# Patient Record
Sex: Male | Born: 1970 | Race: Black or African American | Hispanic: No | Marital: Married | Smoking: Never smoker
Health system: Southern US, Community
[De-identification: ages and names within clinical notes are randomized; demographics above are authoritative.]

## PROBLEM LIST (undated history)

## (undated) DIAGNOSIS — Z0000 Encounter for general adult medical examination without abnormal findings: Secondary | ICD-10-CM

## (undated) DIAGNOSIS — D649 Anemia, unspecified: Secondary | ICD-10-CM

## (undated) DIAGNOSIS — Z01818 Encounter for other preprocedural examination: Secondary | ICD-10-CM

## (undated) DIAGNOSIS — N62 Hypertrophy of breast: Secondary | ICD-10-CM

## (undated) DIAGNOSIS — N183 Chronic kidney disease, stage 3 unspecified (HCC): Principal | ICD-10-CM

## (undated) DIAGNOSIS — Z1211 Encounter for screening for malignant neoplasm of colon: Secondary | ICD-10-CM

## (undated) DIAGNOSIS — E559 Vitamin D deficiency, unspecified: Secondary | ICD-10-CM

## (undated) DIAGNOSIS — R5383 Other fatigue: Principal | ICD-10-CM

## (undated) DIAGNOSIS — Z905 Acquired absence of kidney: Secondary | ICD-10-CM

## (undated) DIAGNOSIS — N1831 Chronic kidney disease, stage 3a (HCC): Secondary | ICD-10-CM

## (undated) DIAGNOSIS — R7989 Other specified abnormal findings of blood chemistry: Secondary | ICD-10-CM

## (undated) DIAGNOSIS — E785 Hyperlipidemia, unspecified: Secondary | ICD-10-CM

## (undated) HISTORY — DX: Hyperlipidemia, unspecified: E78.5

---

## 2016-02-16 LAB — HEPATIC FUNCTION PANEL
ALT: 19 U/L (ref 10–40)
ALT: 19 U/L (ref 10–40)
AST: 18 U/L (ref 14–40)
AST: 18 U/L (ref 14–40)
Bilirubin, Total: 0.5 mg/dL

## 2016-02-16 LAB — LIPID PANEL
CHOLESTEROL: 167 mg/dL (ref 0–200)
CHOLESTEROL: 167 mg/dL (ref 0–200)
HDL: 55 mg/dL (ref 35–70)
HDL: 55 mg/dL (ref 35–70)
LDL Cholesterol: 105 mg/dL
LDL Cholesterol: 105 mg/dL
Triglycerides: 34 mg/dL — AB (ref 40–160)
Triglycerides: 34 mg/dL — AB (ref 40–160)

## 2016-02-16 LAB — BASIC METABOLIC PANEL
BUN: 13 mg/dL (ref 4–21)
BUN: 13 mg/dL (ref 4–21)
Creatinine: 1 mg/dL (ref 0.6–1.3)
Creatinine: 1 mg/dL (ref 0.6–1.3)
Glucose: 100 mg/dL
Glucose: 100 mg/dL
POTASSIUM: 4.3 mmol/L (ref 3.4–5.3)
POTASSIUM: 4.3 mmol/L (ref 3.4–5.3)
SODIUM: 138 mmol/L (ref 137–147)
Sodium: 138 mmol/L (ref 137–147)

## 2016-08-20 ENCOUNTER — Encounter: Payer: Self-pay | Admitting: Family Medicine

## 2016-08-20 ENCOUNTER — Ambulatory Visit (INDEPENDENT_AMBULATORY_CARE_PROVIDER_SITE_OTHER): Payer: 59 | Admitting: Family Medicine

## 2016-08-20 VITALS — BP 120/85 | HR 75 | Resp 12 | Ht 69.0 in | Wt 175.5 lb

## 2016-08-20 DIAGNOSIS — E785 Hyperlipidemia, unspecified: Secondary | ICD-10-CM | POA: Insufficient documentation

## 2016-08-20 DIAGNOSIS — I499 Cardiac arrhythmia, unspecified: Secondary | ICD-10-CM | POA: Diagnosis not present

## 2016-08-20 NOTE — Progress Notes (Signed)
HPI:   [name] is a 45 y.o. male, who is here today to follow on HLD.  I have seen Jeremy Howard Howard at [facility], CPE 01/2016.  Currently he is on non-pharmacologic treatment, trying to follow a low-fat diet. He still has not been consistent with a healthy diet. Last FLP in 12/2015 and as he recalls he needed to follow in 6 months.  He lives with his wife and 4 children. He is exercising regularly 2 or 3 times per week.   No concerns today.   Review of Systems  Constitutional: Negative for activity change, appetite change, fatigue, fever and unexpected weight change.  HENT: Negative for nosebleeds, sore throat and trouble swallowing.   Eyes: Negative for pain and visual disturbance.  Respiratory: Negative for apnea, cough, shortness of breath and wheezing.   Cardiovascular: Negative for chest pain, palpitations and leg swelling.  Gastrointestinal: Negative for abdominal pain, nausea and vomiting.  Genitourinary: Negative for decreased urine volume, difficulty urinating and hematuria.  Neurological: Negative for dizziness, seizures, weakness, numbness and headaches.  Psychiatric/Behavioral: Negative for confusion. The patient is not nervous/anxious.       No current outpatient prescriptions on file prior to visit.   No current facility-administered medications on file prior to visit.      Past Medical History:  Diagnosis Date  . Hyperlipidemia    No Known Allergies  Social History   Social History  . Marital status: Married    Spouse name: N/A  . Number of children: N/A  . Years of education: N/A   Social History Main Topics  . Smoking status: Never Smoker  . Smokeless tobacco: Never Used  . Alcohol use Yes  . Drug use: No  . Sexual activity: Not Asked   Other Topics Concern  . None   Social History Narrative  . None    Vitals:   08/20/16 1318  BP: 120/85  Pulse: 75  Resp: 12   Body mass index is 25.92  kg/m.      Physical Exam  Nursing note and vitals reviewed. Constitutional: He is oriented to person, place, and time. He appears well-developed. No distress.  HENT:  Head: Atraumatic.  Mouth/Throat: Oropharynx is clear and moist and mucous membranes are normal.  Eyes: Conjunctivae and EOM are normal. Pupils are equal, round, and reactive to light.  Neck: No thyroid mass and no thyromegaly present.  Cardiovascular: Normal rate and regular rhythm.   Occasional extrasystoles (x 2) are present.  No murmur heard. Pulses:      Dorsalis pedis pulses are 2+ on the right side, and 2+ on the left side.  Respiratory: Effort normal and breath sounds normal. No respiratory distress.  GI: Soft. He exhibits no mass. There is no hepatomegaly. There is no tenderness.  Musculoskeletal: He exhibits edema (trace pitting edema LE bilateral). He exhibits no tenderness.  Lymphadenopathy:    He has no cervical adenopathy.  Neurological: He is alert and oriented to person, place, and time. He has normal strength. Coordination and gait normal.  Skin: Skin is warm. No erythema.  Psychiatric: He has a normal mood and affect.  Well groomed, good eye contact.      ASSESSMENT AND PLAN:     Diagnoses and all orders for this visit:  Irregular heart rate  Mild and asymptomatic. Today EKG machine is not working, plan on doing EKG next OV. Instructed about warning signs. Decrease caffeine intake.  -  Basic Metabolic Panel; Future -     TSH; Future  Hyperlipidemia  No changes in current management, will follow labs done today and will give further recommendations accordingly.  -     Lipid Panel; Future   -He is not fasting today, so will come next week for fasting labs.    -Jeremy Howard. Yousif [name] Sponsel Sr. was advised to return sooner than planned today if new concerns arise.       Betty G. [place], MD  [facility]. [place] office.

## 2016-08-20 NOTE — Progress Notes (Signed)
Pre visit review using our clinic review tool, if applicable. No additional management support is needed unless otherwise documented below in the visit note. 

## 2016-08-20 NOTE — Patient Instructions (Addendum)
A few things to remember from today's visit:   Irregular heart rate - Plan: Basic Metabolic Panel, TSH  Hyperlipidemia - Plan: Lipid Panel   Little or no red meat a low fat diet. Irregular heart rate is very minimal, caution with caffeine. Today we do not have EKG availability, so we can arrange EKG next OV, before if symptoms.    Please be sure medication list is accurate. If a new problem present, please set up appointment sooner than planned today.

## 2016-08-23 ENCOUNTER — Other Ambulatory Visit (INDEPENDENT_AMBULATORY_CARE_PROVIDER_SITE_OTHER): Payer: 59

## 2016-08-23 DIAGNOSIS — E785 Hyperlipidemia, unspecified: Secondary | ICD-10-CM | POA: Diagnosis not present

## 2016-08-23 DIAGNOSIS — I499 Cardiac arrhythmia, unspecified: Secondary | ICD-10-CM | POA: Diagnosis not present

## 2016-08-23 LAB — LIPID PANEL
Cholesterol: 171 mg/dL (ref 0–200)
HDL: 53.9 mg/dL (ref >39.00)
LDL Cholesterol: 109 mg/dL — ABNORMAL HIGH (ref 0–99)
NONHDL: 117.38
Total CHOL/HDL Ratio: 3
Triglycerides: 40 mg/dL (ref 0.0–149.0)
VLDL: 8 mg/dL (ref 0.0–40.0)

## 2016-08-23 LAB — BASIC METABOLIC PANEL
BUN: 15 mg/dL (ref 6–23)
CALCIUM: 8.9 mg/dL (ref 8.4–10.5)
CO2: 28 meq/L (ref 19–32)
CREATININE: 1.04 mg/dL (ref 0.40–1.50)
Chloride: 105 mEq/L (ref 96–112)
GFR: 99.5 mL/min (ref >60.00)
Glucose, Bld: 100 mg/dL — ABNORMAL HIGH (ref 70–99)
Potassium: 3.9 mEq/L (ref 3.5–5.1)
Sodium: 140 mEq/L (ref 135–145)

## 2016-08-23 LAB — TSH: TSH: 1.93 u[IU]/mL (ref 0.35–4.50)

## 2017-02-07 ENCOUNTER — Other Ambulatory Visit (INDEPENDENT_AMBULATORY_CARE_PROVIDER_SITE_OTHER): Payer: 59

## 2017-02-07 DIAGNOSIS — Z0000 Encounter for general adult medical examination without abnormal findings: Secondary | ICD-10-CM

## 2017-02-07 LAB — CBC WITH DIFFERENTIAL/PLATELET
Basophils Absolute: 0 10*3/uL (ref 0.0–0.1)
Basophils Relative: 0.6 % (ref 0.0–3.0)
Eosinophils Absolute: 0 10*3/uL (ref 0.0–0.7)
Eosinophils Relative: 0.9 % (ref 0.0–5.0)
HCT: 41.3 % (ref 39.0–52.0)
Hemoglobin: 13.6 g/dL (ref 13.0–17.0)
Lymphocytes Relative: 37.8 % (ref 12.0–46.0)
Lymphs Abs: 1 10*3/uL (ref 0.7–4.0)
MCHC: 32.9 g/dL (ref 30.0–36.0)
MCV: 83.3 fl (ref 78.0–100.0)
Monocytes Absolute: 0.3 10*3/uL (ref 0.1–1.0)
Monocytes Relative: 12.2 % — ABNORMAL HIGH (ref 3.0–12.0)
Neutro Abs: 1.3 10*3/uL — ABNORMAL LOW (ref 1.4–7.7)
Neutrophils Relative %: 48.5 % (ref 43.0–77.0)
Platelets: 195 10*3/uL (ref 150.0–400.0)
RBC: 4.96 Mil/uL (ref 4.22–5.81)
RDW: 12.9 % (ref 11.5–15.5)
WBC: 2.7 10*3/uL — ABNORMAL LOW (ref 4.0–10.5)

## 2017-02-07 LAB — POC URINALSYSI DIPSTICK (AUTOMATED)
Bilirubin, UA: NEGATIVE
Glucose, UA: NEGATIVE
Ketones, UA: NEGATIVE
Leukocytes, UA: NEGATIVE
NITRITE UA: NEGATIVE
PH UA: 5.5
RBC UA: NEGATIVE
Spec Grav, UA: >=1.03
UROBILINOGEN UA: 0.2

## 2017-02-07 LAB — BASIC METABOLIC PANEL
BUN: 13 mg/dL (ref 6–23)
CALCIUM: 9.3 mg/dL (ref 8.4–10.5)
CO2: 29 meq/L (ref 19–32)
CREATININE: 1.02 mg/dL (ref 0.40–1.50)
Chloride: 104 mEq/L (ref 96–112)
GFR: 101.54 mL/min (ref >60.00)
Glucose, Bld: 104 mg/dL — ABNORMAL HIGH (ref 70–99)
Potassium: 3.9 mEq/L (ref 3.5–5.1)
SODIUM: 140 meq/L (ref 135–145)

## 2017-02-07 LAB — LIPID PANEL
CHOL/HDL RATIO: 3
CHOLESTEROL: 184 mg/dL (ref 0–200)
HDL: 58.2 mg/dL (ref >39.00)
LDL Cholesterol: 118 mg/dL — ABNORMAL HIGH (ref 0–99)
NonHDL: 125.78
TRIGLYCERIDES: 41 mg/dL (ref 0.0–149.0)
VLDL: 8.2 mg/dL (ref 0.0–40.0)

## 2017-02-07 LAB — HEPATIC FUNCTION PANEL
ALBUMIN: 4.1 g/dL (ref 3.5–5.2)
ALK PHOS: 52 U/L (ref 39–117)
ALT: 19 U/L (ref 0–53)
AST: 19 U/L (ref 0–37)
BILIRUBIN DIRECT: 0.1 mg/dL (ref 0.0–0.3)
TOTAL PROTEIN: 7.1 g/dL (ref 6.0–8.3)
Total Bilirubin: 0.5 mg/dL (ref 0.2–1.2)

## 2017-02-08 LAB — TSH: TSH: 1.61 u[IU]/mL (ref 0.35–4.50)

## 2017-02-08 LAB — PSA: PSA: 0.37 ng/mL (ref 0.10–4.00)

## 2017-02-16 NOTE — Progress Notes (Signed)
HPI:  Mr. Jeremy Howard Sr. is a 46 y.o.male here today for his routine physical examination.  He lives with wife and their 4 children.  Regular exercise 3 or more times per week: 2 times per week. Following a healthy diet: Not consistently.    Chronic medical problems: HLD.  Hx of STD's: Per pt report Hx of positive hepatitis C ab about 10 years ago,as he recalls  he had 2 more test that were negative.He is not sure about indication for screening. He denies risk factors.    Immunization History  Administered Date(s) Administered  . Tdap 02/16/2016    -Denies high alcohol intake, tobacco use, or Hx of illicit drug use.  He had labs done recently:  Lab Results  Component Value Date   CHOL 184 02/07/2017   HDL 58.20 02/07/2017   LDLCALC 118 (H) 02/07/2017   TRIG 41.0 02/07/2017   CHOLHDL 3 02/07/2017   Lab Results  Component Value Date   TSH 1.61 02/07/2017   Lab Results  Component Value Date   WBC 2.7 (L) 02/07/2017   HGB 13.6 02/07/2017   HCT 41.3 02/07/2017   MCV 83.3 02/07/2017   PLT 195.0 02/07/2017   Lab Results  Component Value Date   ALT 19 02/07/2017   AST 19 02/07/2017   ALKPHOS 52 02/07/2017   BILITOT 0.5 02/07/2017    -Concerns and/or follow up today: Laud snoring, wife has not noted apnea. Denies fatigue or morning headaches.  WBC's mildly low. He reports Hx of low WBC's.  Colonoscopy done at 46 yo. According to pt,his father had a abnormal colonoscopy, ? Cancer.Mother Hx of colon cancer in her 34's. He is not sure about follow up recommendations.  Last OV irregular HR noted,we could not do an EKG because machine was not working. Denies chest pain, dyspnea, palpitation,or edema.   Review of Systems  Constitutional: Negative for activity change, appetite change, fatigue, fever and unexpected weight change.  HENT: Negative for dental problem, nosebleeds, sore throat, trouble swallowing and voice change.   Eyes: Negative for  redness and visual disturbance.  Respiratory: Negative for apnea, cough, shortness of breath and wheezing.   Cardiovascular: Negative for chest pain, palpitations and leg swelling.  Gastrointestinal: Negative for abdominal pain, blood in stool, nausea and vomiting.  Endocrine: Negative for polydipsia and polyuria.  Genitourinary: Negative for decreased urine volume, dysuria, genital sores, hematuria and testicular pain.  Musculoskeletal: Negative for arthralgias, back pain, joint swelling and myalgias.  Skin: Negative for color change and rash.  Neurological: Negative for dizziness, seizures, syncope, weakness, numbness and headaches.  Hematological: Negative for adenopathy. Does not bruise/bleed easily.  Psychiatric/Behavioral: Negative for confusion and sleep disturbance. The patient is not nervous/anxious.      No current outpatient prescriptions on file prior to visit.   No current facility-administered medications on file prior to visit.      Past Medical History:  Diagnosis Date  . Hyperlipidemia     No Known Allergies  Family History  Problem Relation Age of Onset  . Cancer Mother 78    Colon  . Cancer Father     ? colon  . Hypertension Father   . Diabetes Father     Social History   Social History  . Marital status: Married    Spouse name: N/A  . Number of children: N/A  . Years of education: N/A   Social History Main Topics  . Smoking status: Never Smoker  .  Smokeless tobacco: Never Used  . Alcohol use Yes  . Drug use: No  . Sexual activity: Yes    Birth control/ protection: Surgical   Other Topics Concern  . None   Social History Narrative  . None    Vitals:   02/17/17 0800  BP: 138/90  Pulse: 85  Resp: 12   Body mass index is 26.03 kg/m. O2 sat at RA 98%  Wt Readings from Last 3 Encounters:  02/17/17 176 lb 4 oz (79.9 kg)  08/20/16 175 lb 8 oz (79.6 kg)     Physical Exam  Nursing note and vitals reviewed. Constitutional: He is  oriented to person, place, and time. He appears well-developed and well-nourished. No distress.  HENT:  Head: Atraumatic.  Right Ear: Hearing, tympanic membrane, external ear and ear canal normal.  Left Ear: Hearing, tympanic membrane, external ear and ear canal normal.  Mouth/Throat: Oropharynx is clear and moist and mucous membranes are normal.  Eyes: Conjunctivae and EOM are normal. Pupils are equal, round, and reactive to light.  Neck: Normal range of motion. No JVD present. No tracheal deviation present. No thyroid mass and no thyromegaly present.  Cardiovascular: Normal rate and regular rhythm.   No murmur heard. Pulses:      Dorsalis pedis pulses are 2+ on the right side, and 2+ on the left side.  Respiratory: Effort normal and breath sounds normal. No respiratory distress.  GI: Soft. He exhibits no mass. There is no hepatomegaly. There is no tenderness.  Musculoskeletal: He exhibits no edema or tenderness.  No major deformities appreciated and no signs of synovitis.  Lymphadenopathy:    He has no cervical adenopathy.       Right: No supraclavicular adenopathy present.       Left: No supraclavicular adenopathy present.  Neurological: He is alert and oriented to person, place, and time. He has normal strength. No cranial nerve deficit or sensory deficit. Coordination and gait normal.  Reflex Scores:      Bicep reflexes are 2+ on the right side and 2+ on the left side.      Patellar reflexes are 2+ on the right side and 2+ on the left side. Skin: Skin is warm. No erythema.  Psychiatric: He has a normal mood and affect.     ASSESSMENT AND PLAN:   Discussed the following assessment and plan:   Jeremy Howard was seen today for annual exam.  Diagnoses and all orders for this visit:  Routine physical examination   We discussed the importance of regular physical activity and healthy diet for prevention of chronic illness. Preventive guidelines reviewed. Vaccination up to  date. Next CPE in 1-2 years.   Encounter for HCV screening test for low risk patient -     Hepatitis C antibody screen  Irregular heart rate  Asymptomatic. EKG today: SR,normal axis and intervals, PAC's. No other EKG for comparison. Instructed about warning signs.   -     EKG 12-Lead  Family hx of colon cancer -     Ambulatory referral to Gastroenterology   He will try to obtain copy of prior labs done while he was living in [place], we will following with labs if necessary.   Return in 1 year (on 02/17/2018).    Jeremy Breden G. [place], MD  [facility]. [place] office.

## 2017-02-17 ENCOUNTER — Encounter: Payer: Self-pay | Admitting: Family Medicine

## 2017-02-17 ENCOUNTER — Ambulatory Visit (INDEPENDENT_AMBULATORY_CARE_PROVIDER_SITE_OTHER): Payer: 59 | Admitting: Family Medicine

## 2017-02-17 VITALS — BP 138/90 | HR 85 | Resp 12 | Ht 69.0 in | Wt 176.2 lb

## 2017-02-17 DIAGNOSIS — Z800 Family history of malignant neoplasm of digestive organs: Secondary | ICD-10-CM | POA: Diagnosis not present

## 2017-02-17 DIAGNOSIS — Z0000 Encounter for general adult medical examination without abnormal findings: Secondary | ICD-10-CM

## 2017-02-17 DIAGNOSIS — I499 Cardiac arrhythmia, unspecified: Secondary | ICD-10-CM

## 2017-02-17 DIAGNOSIS — Z1159 Encounter for screening for other viral diseases: Secondary | ICD-10-CM

## 2017-02-17 NOTE — Progress Notes (Signed)
Pre visit review using our clinic review tool, if applicable. No additional management support is needed unless otherwise documented below in the visit note. 

## 2017-02-17 NOTE — Patient Instructions (Addendum)
A few things to remember from today's visit:   Routine physical examination  Encounter for screening for HIV - Plan: Ambulatory referral to Gastroenterology  Encounter for HCV screening test for low risk patient - Plan: Hepatitis C antibody screen  Irregular heart rate - Plan: EKG 12-Lead    At least 150 minutes of moderate exercise per week, daily brisk walking for 15-30 min is a good exercise option. Healthy diet low in saturated (animal) fats and sweets and consisting of fresh fruits and vegetables, lean meats such as fish and white chicken and whole grains.  - Vaccines:  Tdap vaccine every 10 years.  Shingles vaccine recommended at age 45, could be given after 45 years of age but not sure about insurance coverage.  Pneumonia vaccines:  Prevnar 13 at 65 and Pneumovax at 2566.   -Screening recommendations for low/normal risk males:  Screening for diabetes at age 45-45 and every 3 years.    Lipid screening at 35 and every 3 years.   Colonoscopy for colon cancer screening at age 45 and until age 45.In your case because risk factors we started earlier.  Prostate cancer screening: some controversy.  For snoring: Nasal spray like Rhinocort, nasal strips,and sleeping on your side instead on the back.         Please be sure medication list is accurate. If a new problem present, please set up appointment sooner than planned today.

## 2017-02-18 LAB — HEPATITIS C ANTIBODY: HCV Ab: NEGATIVE

## 2017-02-21 ENCOUNTER — Ambulatory Visit (INDEPENDENT_AMBULATORY_CARE_PROVIDER_SITE_OTHER): Payer: 59 | Admitting: Family Medicine

## 2017-02-21 ENCOUNTER — Encounter: Payer: Self-pay | Admitting: Family Medicine

## 2017-02-21 VITALS — BP 118/80 | HR 83 | Temp 100.3°F | Resp 12 | Ht 69.0 in | Wt 175.2 lb

## 2017-02-21 DIAGNOSIS — R509 Fever, unspecified: Secondary | ICD-10-CM | POA: Diagnosis not present

## 2017-02-21 DIAGNOSIS — J069 Acute upper respiratory infection, unspecified: Secondary | ICD-10-CM | POA: Diagnosis not present

## 2017-02-21 LAB — POC INFLUENZA A&B (BINAX/QUICKVUE)
INFLUENZA A, POC: NEGATIVE
INFLUENZA B, POC: NEGATIVE

## 2017-02-21 MED ORDER — KETOROLAC TROMETHAMINE 60 MG/2ML IM SOLN
60.0000 mg | Freq: Once | INTRAMUSCULAR | Status: AC
  Administered 2017-02-21: 60 mg via INTRAMUSCULAR

## 2017-02-21 MED ORDER — BENZONATATE 100 MG PO CAPS: 200.0000 mg | ORAL_CAPSULE | Freq: Two times a day (BID) | ORAL | 0 refills | Status: AC | PRN

## 2017-02-21 NOTE — Progress Notes (Addendum)
HPI:  ACUTE VISIT  Chief Complaint  Patient presents with  . congestion, cough    [name] is a 46 y.o.male here today complaining of 2-3 days of respiratory symptoms.   URI   This is a new problem. The current episode started in the past 7 days. The problem has been gradually worsening. The maximum temperature recorded prior to his arrival was 100.4 - 100.9 F. Associated symptoms include congestion, coughing, headaches (worse with cough) and rhinorrhea. Pertinent negatives include no abdominal pain, chest pain, diarrhea, ear pain, nausea, neck pain, rash, sore throat or vomiting.    Mild productive cough with yellowish sputum. + Nasal congestion, rhinorrhea, and post nasal drainage. + Fever and body aches. He has not noted chest pain or dyspnea, not sure about wheezing.  No Hx of recent travel. Sick contact: Coworker. No known insect bite.  Hx of allergies: No  OTC medications for this problem: Mucinex today,Ibuprofen 2 days ago.He has not taken antipyretic/analgesic today.   Symptoms otherwise stable.   Review of Systems  Constitutional: Positive for appetite change, fatigue and fever. Negative for chills.  HENT: Positive for congestion, postnasal drip and rhinorrhea. Negative for ear pain, mouth sores, sinus pressure, sore throat, trouble swallowing and voice change.   Eyes: Negative for discharge, redness and visual disturbance.  Respiratory: Positive for cough. Negative for chest tightness and shortness of breath.   Cardiovascular: Negative for chest pain and leg swelling.  Gastrointestinal: Negative for abdominal pain, diarrhea, nausea and vomiting.  Musculoskeletal: Positive for myalgias. Negative for arthralgias and neck pain.  Skin: Negative for rash.  Neurological: Positive for headaches (worse with cough). Negative for syncope and weakness.  Hematological: Negative for adenopathy. Does not bruise/bleed easily.    Psychiatric/Behavioral: Negative for confusion.      No current outpatient prescriptions on file prior to visit.   No current facility-administered medications on file prior to visit.      Past Medical History:  Diagnosis Date  . Hyperlipidemia    No Known Allergies  Social History   Social History  . Marital status: Married    Spouse name: N/A  . Number of children: N/A  . Years of education: N/A   Social History Main Topics  . Smoking status: Never Smoker  . Smokeless tobacco: Never Used  . Alcohol use Yes  . Drug use: No  . Sexual activity: Yes    Birth control/ protection: Surgical   Other Topics Concern  . None   Social History Narrative  . None    Vitals:   02/21/17 1006  BP: 118/80  Pulse: 83  Resp: 12  Temp: 100.3 F (37.9 C)  O2 sat 97% at RA. Body mass index is 25.88 kg/m.   Physical Exam  Nursing note and vitals reviewed. Constitutional: He is oriented to person, place, and time. He appears well-developed and well-nourished. He does not appear ill. No distress.  HENT:  Head: Atraumatic.  Right Ear: Tympanic membrane, external ear and ear canal normal.  Left Ear: Tympanic membrane, external ear and ear canal normal.  Nose: Rhinorrhea present. Right sinus exhibits no maxillary sinus tenderness and no frontal sinus tenderness. Left sinus exhibits no maxillary sinus tenderness and no frontal sinus tenderness.  Mouth/Throat: Oropharynx is clear and moist and mucous membranes are normal.  Eyes: Conjunctivae and EOM are normal.  Cardiovascular: Normal rate and regular rhythm.   Occasional extrasystoles are present.  No murmur heard. Respiratory: Effort normal  and breath sounds normal. No stridor. No respiratory distress.  Lymphadenopathy:       Head (right side): No submandibular adenopathy present.       Head (left side): No submandibular adenopathy present.    He has no cervical adenopathy.  Neurological: He is alert and oriented to person,  place, and time. He has normal strength.  Skin: Skin is warm. No rash noted. No erythema.  Psychiatric: He has a normal mood and affect. His speech is normal.  Well groomed, good eye contact.      ASSESSMENT AND PLAN:   Larkin was seen today for congestion, cough.  Diagnoses and all orders for this visit:  Fever, unspecified fever cause -     POC Influenza A&B (Binax test) -     ketorolac (TORADOL) injection 60 mg; Inject 2 mLs (60 mg total) into the muscle once.  URI, acute -     benzonatate (TESSALON) 100 MG capsule; Take 2 capsules (200 mg total) by mouth 2 (two) times daily as needed for cough.   Symptoms suggests a viral etiology, I explained patient that symptomatic treatment is recommended at this time.  After verbal consider he received Toradol 60 mg IM x 1 for fever and myalgias. Rapid flu negative. We also discussed the possibility of influenza and risk vs benefits of empiric treatment with Tamiflu. We agree on not doing so.  Instructed to monitor for signs of complications, clearly instructed about warning signs. I also explained that cough and nasal congestion can last a few days and sometimes weeks. F/U as needed.     -[name] was advised to return or notify a doctor immediately if symptoms worsen or persist or new concerns arise.       Everlean Bucher G. [place], MD  [facility]. [place] office.

## 2017-02-21 NOTE — Patient Instructions (Signed)
A few things to remember from today's visit:   Fever, unspecified fever cause - Plan: POC Influenza A&B (Binax test)  URI, acute  viral infections are self-limited and we treat each symptom depending of severity.  Over the counter medications as decongestants and cold medications usually help, they need to be taken with caution if there is a history of high blood pressure or palpitations. Tylenol and/or Ibuprofen also helps with most symptoms (headache, muscle aching, fever,etc) Plenty of fluids. Honey helps with cough. Steam inhalations helps with runny nose, nasal congestion, and may prevent sinus infections. Cough and nasal congestion could last a few days and sometimes weeks. Please follow in not any better in 1-2 weeks or if symptoms get worse.   Please be sure medication list is accurate. If a new problem present, please set up appointment sooner than planned today.

## 2017-02-21 NOTE — Progress Notes (Signed)
Pre visit review using our clinic review tool, if applicable. No additional management support is needed unless otherwise documented below in the visit note. 

## 2017-04-20 ENCOUNTER — Encounter: Payer: Self-pay | Admitting: Family Medicine

## 2017-04-27 ENCOUNTER — Ambulatory Visit (INDEPENDENT_AMBULATORY_CARE_PROVIDER_SITE_OTHER): Payer: 59 | Admitting: Family Medicine

## 2017-04-27 ENCOUNTER — Encounter: Payer: Self-pay | Admitting: Family Medicine

## 2017-04-27 ENCOUNTER — Ambulatory Visit (INDEPENDENT_AMBULATORY_CARE_PROVIDER_SITE_OTHER)
Admission: RE | Admit: 2017-04-27 | Discharge: 2017-04-27 | Disposition: A | Discharge status: Home / Self Care | Payer: 59 | Source: Ambulatory Visit | Attending: Family Medicine | Admitting: Family Medicine

## 2017-04-27 VITALS — BP 118/78 | HR 76 | Temp 98.8°F | Resp 12 | Ht 69.0 in | Wt 171.5 lb

## 2017-04-27 DIAGNOSIS — R0781 Pleurodynia: Secondary | ICD-10-CM | POA: Diagnosis not present

## 2017-04-27 DIAGNOSIS — R05 Cough: Secondary | ICD-10-CM

## 2017-04-27 DIAGNOSIS — R053 Chronic cough: Secondary | ICD-10-CM

## 2017-04-27 DIAGNOSIS — M7542 Impingement syndrome of left shoulder: Secondary | ICD-10-CM

## 2017-04-27 NOTE — Patient Instructions (Signed)
A few things to remember from today's visit:   Persistent cough - Plan: DG Chest 2 View  Costal margin pain  Impingement syndrome of left shoulder - Plan: Ambulatory referral to Physical Therapy  Cough: ? GERD,allergies among some.  Today X ray was ordered.  [facility] between 8 am and 5 pm: [address] [phone].   Please be sure medication list is accurate. If a new problem present, please set up appointment sooner than planned today.

## 2017-04-27 NOTE — Progress Notes (Signed)
HPI:   ACUTE VISIT:  Chief Complaint  Patient presents with  . Cough  . chest congestion    Jeremy [name] Malatesta Sr. is a 46 y.o. male, who is here today complaining of cough and shoulder pain.  I saw him on 02/21/17 for acute URI, he states that all symptoms resolved,including cough. Cough started back about a month ago, "lingering cough." It is occasional, he has not identified exacerbating factors, states that he does not have cough when he is "moving around." Denies Hx of allergies or GERD like symptoms.  Also concerned about "something I feel in my lungs", 2 weeks of right-sided chest pain (rigth rib cage) with deep breathing and when lying on right side in bed. No hx of trauma, no deformity, local edema ,or erythema. Initially he was feeling like a "clicking" sensation in RUQ with deep breathing that lasted a week. Pain is achy like,2/10,no radiated. He has not tried OTC treatment. It has improved.  Cough  This is a new problem. The current episode started 1 to 4 weeks ago. The problem has been unchanged. The cough is non-productive. Pertinent negatives include no chills, fever, headaches, heartburn, hemoptysis, nasal congestion, postnasal drip, rash, rhinorrhea, sore throat, shortness of breath, sweats, weight loss or wheezing. Nothing aggravates the symptoms. He has tried nothing for the symptoms. There is no history of environmental allergies.  Shoulder Pain   The pain is present in the left shoulder. This is a new problem. The current episode started 1 to 4 weeks ago. There has been no history of extremity trauma. The problem occurs intermittently. The problem has been gradually improving. The quality of the pain is described as sharp. The pain is at a severity of 4/10. The pain is moderate. Associated symptoms include a limited range of motion. Pertinent negatives include no fever, itching, joint locking, joint swelling, numbness or stiffness. The symptoms are aggravated  by activity. He has tried rest for the symptoms. The treatment provided moderate relief. There is no history of diabetes or osteoarthritis.   About 2-3 weeks ago he was squatted holding himself with hyperextended left hand on floor while he was doing rotation ROM with right UE, he felt "little pain" in left shoulder. After this he has felt like his shoulders "pops out and back in place" with certain movements. He denies limitation of ROM.  He has not tried OTC analgesics or treatments. He has tried to avoid activities that involve above head upper extremity movement. Pain is stable.  Review of Systems  Constitutional: Negative for appetite change, chills, fatigue, fever, unexpected weight change and weight loss.  HENT: Negative for congestion, mouth sores, postnasal drip, rhinorrhea, sore throat and trouble swallowing.   Respiratory: Positive for cough. Negative for hemoptysis, chest tightness, shortness of breath and wheezing.   Cardiovascular: Negative for palpitations and leg swelling.  Gastrointestinal: Negative for abdominal pain, heartburn, nausea and vomiting.  Genitourinary: Negative for decreased urine volume, dysuria, flank pain and hematuria.  Musculoskeletal: Positive for arthralgias. Negative for back pain, joint swelling, neck pain and stiffness.  Skin: Negative for itching, pallor and rash.  Allergic/Immunologic: Negative for environmental allergies.  Neurological: Negative for weakness, numbness and headaches.  Hematological: Negative for adenopathy. Does not bruise/bleed easily.  Psychiatric/Behavioral: Positive for sleep disturbance. Negative for confusion. The patient is nervous/anxious.      No current outpatient prescriptions on file prior to visit.   No current facility-administered medications on file prior to visit.  Past Medical History:  Diagnosis Date  . Hyperlipidemia    No Known Allergies  Social History   Social History  . Marital status: Married     Spouse name: N/A  . Number of children: N/A  . Years of education: N/A   Social History Main Topics  . Smoking status: Never Smoker  . Smokeless tobacco: Never Used  . Alcohol use Yes  . Drug use: No  . Sexual activity: Yes    Birth control/ protection: Surgical   Other Topics Concern  . None   Social History Narrative  . None    Vitals:   04/27/17 0923  BP: 118/78  Pulse: 76  Resp: 12  Temp: 98.8 F (37.1 C)  O2 sat at RA 97% Body mass index is 25.33 kg/m.   Physical Exam  Nursing note and vitals reviewed. Constitutional: He is oriented to person, place, and time. He appears well-developed and well-nourished. No distress.  HENT:  Head: Atraumatic.  Mouth/Throat: Oropharynx is clear and moist and mucous membranes are normal.  Post nasal drainage.  Eyes: Conjunctivae and EOM are normal.  Cardiovascular: Normal rate and regular rhythm.   No murmur heard. Respiratory: Effort normal and breath sounds normal. No respiratory distress. He exhibits no tenderness.  GI: Soft. He exhibits no mass. There is no hepatomegaly. There is no tenderness.  Musculoskeletal: He exhibits no edema.  Left shoulder: No deformity, edema, or erythema appreciated.No muscle atrophy. Juanetta GoslingHawkins' test pos, drop arm rotator cuff test pos, empty can supraspinatus test pos, lift-Off Subscapularis test limitation of internal rotation, pain elicited. ROM limited.  No tenderness upon palpation of right rib cage,no deformity.Pain elicited with deep breathing during auscultation.  Lymphadenopathy:    He has no cervical adenopathy.  Neurological: He is alert and oriented to person, place, and time. He has normal strength. Coordination and gait normal.  Skin: Skin is warm. No rash noted. No erythema.  Psychiatric: He has a normal mood and affect. Cognition and memory are normal.  Well groomed, good eye contact.    ASSESSMENT AND PLAN:   Jeremy Howard was seen today for cough and chest  congestion.  Diagnoses and all orders for this visit:  Persistent cough  ? Allergies,post nasal drip, GERD among some. Lung auscultation negative, cough doe snot seem to be interfering with activities or sleep. Further recommendations will be given according to imaging results. Instructed about warning signs.  -     DG Chest 2 View; Future  Costal margin pain  We discussed a few possible etiologies. It seems musculoskeletal, examination today otherwise normal. Avoid shallow breathing. Instructed about warning signs. F/U as needed.   Impingement syndrome of left shoulder  Treatment options discussed. PT recommended. Since there is not a Hx of blunt trauma I do not think imaging is needed at this time. If not improved with PT MRI and/or ortho referral will be necessary.  -     Ambulatory referral to Physical Therapy   -Jeremy [name] Falzon Sr. advised to seek immediate medical attention is symptoms suddenly get worse or to follow if symptoms persist or new concerns arise.       Betty G. [place]Jordan, MD  [facility]. [place] [facility].

## 2017-08-18 ENCOUNTER — Encounter: Payer: Self-pay | Admitting: Family Medicine

## 2017-08-30 ENCOUNTER — Ambulatory Visit: Payer: 59 | Admitting: Physical Therapy

## 2017-08-30 ENCOUNTER — Ambulatory Visit: Payer: 59 | Attending: Family Medicine | Admitting: Physical Therapy

## 2017-08-30 ENCOUNTER — Encounter: Payer: Self-pay | Admitting: Physical Therapy

## 2017-08-30 DIAGNOSIS — M6281 Muscle weakness (generalized): Secondary | ICD-10-CM | POA: Diagnosis present

## 2017-08-30 DIAGNOSIS — M25512 Pain in left shoulder: Secondary | ICD-10-CM | POA: Diagnosis present

## 2017-08-30 DIAGNOSIS — M25612 Stiffness of left shoulder, not elsewhere classified: Secondary | ICD-10-CM | POA: Diagnosis present

## 2017-08-30 NOTE — Patient Instructions (Signed)
   ELASTIC BAND SHOULDER EXTERNAL ROTATION - ER  While holding an elastic band at your side with your elbow bent, start with your hand near your stomach and then pull the band away. Keep your elbow at your side the entire time. 20x    ELASTIC BAND SHOULDER INTERNAL ROTATION - IR  While holding an elastic band at your side with your elbow bent, start with your hand away from your stomach, then pull the band towards your stomach. Keep your elbow near your side the entire time. 20x    Rows with Theraband  Standing with feet hip width apart, stomach drawn in and glute muscles tight. Start with arms extended in front of you. Pull shoulder blades down and back and then pull elbows in towards body. Slowly return to starting position.  20x    ELASTIC BAND SCAPULAR RETRACTIONS WITH MINI SHOULDER EXTENSIONS  While holding an elastic band with both arms in front of you with your elbows straight, squeeze your shoulder blades together as you pull the band back. Be sure your shoulders do not raise up.  20x

## 2017-08-30 NOTE — Therapy (Signed)
[facility] [address] [place], [place], [address] Phone: [phone]   Fax:  [phone]  Physical Therapy Evaluation  Patient Details  Name: Jeremy Deruiter [facility] Sr. MRN: [id number] Date of Birth: 1971-03-27 Referring Provider: [place], Betty G, MD  Encounter Date: 08/30/2017      PT End of Session - 08/30/17 0838    Visit Number 1   Date for PT Re-Evaluation 10/25/17   PT Start Time 0802   PT Stop Time 0838   PT Time Calculation (min) 36 min   Activity Tolerance Patient tolerated treatment well   Behavior During Therapy [facility] for tasks assessed/performed      Past Medical History:  Diagnosis Date  . Hyperlipidemia     History reviewed. No pertinent surgical history.  There were no vitals filed for this visit.       Subjective Assessment - 08/30/17 0806    Subjective Doesn't have pain frequently.  Mostly when reaching arm out to pick up something and I don't have full motion.  I fell on the shoulder and then felt a little strain in it when lifting weights.  I feel like straining to reach overhead   Limitations House hold activities   Patient Stated Goals strength and full ROM   Currently in Pain? Yes   Pain Score 6   only 6/10 when reaching with resistance   Pain Location Shoulder   Pain Orientation Left  AC joint   Pain Descriptors / Indicators Dull   Pain Type Acute pain   Pain Onset More than a month ago   Pain Frequency Intermittent   Aggravating Factors  reaching out   Pain Relieving Factors out of the position   Effect of Pain on Daily Activities use it less   Multiple Pain Sites No            [facility] PT Assessment - 08/30/17 0001      Assessment   Medical Diagnosis M75.42 (ICD-10-CM) - Impingement syndrome of left shoulder   Referring Provider [place], Betty G, MD   Onset Date/Surgical Date --  fell on shoulder a few months ago   Hand Dominance Right   Prior Therapy No     Precautions   Precautions None     Restrictions   Weight Bearing Restrictions No     Balance Screen   Has the patient fallen in the past 6 months Yes   How many times? 1   Has the patient had a decrease in activity level because of a fear of falling?  No   Is the patient reluctant to leave their home because of a fear of falling?  No     Home [employer]ist information centre manager residence   Living Arrangements Spouse/significant other;Children     Prior Function   Level of Independence Independent   Vocation Full time employment   Medical illustrator, sitting/computer     Cognition   Overall Cognitive Status Within Functional Limits for tasks assessed     Observation/Other Assessments   Focus on Therapeutic Outcomes (FOTO)  41% limited     Posture/Postural Control   Posture/Postural Control Postural limitations   Postural Limitations Rounded Shoulders     ROM / Strength   AROM / PROM / Strength AROM;Strength     AROM   AROM Assessment Site Shoulder   Right Shoulder Flexion 144 Degrees   Right Shoulder ABduction 158 Degrees   Left Shoulder Flexion 135 Degrees  Left Shoulder ABduction 140 Degrees     Strength   Strength Assessment Site Shoulder   Right Shoulder Flexion 5/5   Right Shoulder External Rotation 5/5   Left Shoulder Flexion 4+/5   Left Shoulder External Rotation 4+/5     Palpation   Palpation comment anterior delt and pecs tight and tender     Special Tests    Special Tests Rotator Cuff Impingement   Rotator Cuff Impingment tests Neer impingement test;Hawkins- Kennedy test     Neer Impingement test    Findings Positive   Side Left     Hawkins-Kennedy test   Findings Positive   Side Left     Ambulation/Gait   Gait Pattern Within Functional Limits            Objective measurements completed on examination: See above findings.          [facility] Adult PT Treatment/Exercise - 08/30/17 0001      Exercises   Exercises  Other Exercises   Other Exercises  HEP as seen in chart -educated and performed     Manual Therapy   Manual Therapy Soft tissue mobilization   Soft tissue mobilization anterior delt and pec - left side only                PT Education - 08/30/17 0837    Education provided Yes   Education Details shoulder band exercise 4 ways, pec stretch   Person(s) Educated Patient   Methods Explanation;Demonstration;Verbal cues;Handout   Comprehension Verbalized understanding;Returned demonstration          PT Short Term Goals - 08/30/17 0840      PT SHORT TERM GOAL #1   Title pt will be independent with initial HEP   Time 4   Period Weeks   Status New   Target Date 09/27/17     PT SHORT TERM GOAL #2   Title pt will report 25% reduction in stiffness   Time 4   Period Weeks   Status New   Target Date 09/27/17     PT SHORT TERM GOAL #3   Title Pt will report 25% less pain and popping in shoulder   Time 4   Period Weeks   Status New   Target Date 09/27/17           PT Long Term Goals - 08/30/17 0842      PT LONG TERM GOAL #1   Title pt will be independent in advanced HEP   Time 8   Period Weeks   Status New   Target Date 10/25/17     PT LONG TERM GOAL #2   Title pt will be able to perform tasks like washing car with outreached arm without pain   Time 8   Period Weeks   Status New   Target Date 10/25/17     PT LONG TERM GOAL #3   Title pt will be < or = to 26% limited shown by FOTO score   Time 8   Period Weeks   Status New   Target Date 10/25/17     PT LONG TERM GOAL #4   Title Pt will have full flexion and abduction ROM equal to Rt side   Time 8   Period Weeks   Status New   Target Date 10/25/17     PT LONG TERM GOAL #5   Title Pt will have 5/5 MMT Lt shoulder flexion without pain or popping   Time  8   Period Weeks   Status New   Target Date 10/25/17                Plan - 08/30/17 1316    Clinical Impression Statement Pt presents  to clinic due to left shoulder pain when reaching out and putting weight through the arm.  Pain is limiting his exercise due to pain.  Pt demonstrates decreased left shoulder ROM and weakness with some popping during resisted flexion.  Pt has muscle spasms in left anterior deltoid and pec.  Painful RTC muscle attachments present . Pt will benefit from skilled PT to address imprairments and return to prior level of activities.   History and Personal Factors relevant to plan of care: n/a   Clinical Presentation Stable   Clinical Decision Making Low   Rehab Potential Excellent   PT Frequency 1x / week   PT Duration 8 weeks   PT Treatment/Interventions ADLs/Self Care Home Management;Cryotherapy;Electrical Stimulation;Iontophoresis /ml Dexamethasone;Moist Heat;Ultrasound;Neuromuscular re-education;Dry needling;Taping;Manual techniques;Therapeutic activities;Therapeutic exercise;Passive range of motion   Consulted and Agree with Plan of Care Patient      Patient will benefit from skilled therapeutic intervention in order to improve the following deficits and impairments:  Decreased range of motion, Decreased strength, Pain, Increased muscle spasms  Visit Diagnosis: Acute pain of left shoulder  Stiffness of left shoulder, not elsewhere classified  Muscle weakness (generalized)     Problem List Patient Active Problem List   Diagnosis Date Noted  . Hyperlipidemia 08/20/2016    Vincente Poli, PT 08/30/2017, 1:30 PM  [facility] [address], [address] [place], [place], [address] Phone: [phone]   Fax:  [phone]  Name: Jeremy Arteaga [facility] Sr. MRN: [id number] Date of Birth: 1971/05/04

## 2017-09-05 ENCOUNTER — Ambulatory Visit: Payer: 59 | Admitting: Physical Therapy

## 2017-09-05 ENCOUNTER — Encounter: Payer: Self-pay | Admitting: Physical Therapy

## 2017-09-05 DIAGNOSIS — M25512 Pain in left shoulder: Secondary | ICD-10-CM

## 2017-09-05 DIAGNOSIS — M6281 Muscle weakness (generalized): Secondary | ICD-10-CM

## 2017-09-05 DIAGNOSIS — M25612 Stiffness of left shoulder, not elsewhere classified: Secondary | ICD-10-CM

## 2017-09-05 NOTE — Patient Instructions (Signed)
  I, Y, T - resting on large ball - 2x 10 each   Bilateral Shoulder External Rotation  Standing or sitting with upright posture, hold the middle of a piece of theraband with both hands. With your elbows at your side and bent at 90 degree angle, pull your hands outward and hold for 3 seconds. You should feel you shoulders roll backward and your chest move forward.  1 set of 10x, do 3x/ day    Shoulder Flexion - Theraband  Place one end of the theraband under your foot and one in your hand.  Keeping elbow straight, raise arm straight out in front. 2 sets of 10

## 2017-09-05 NOTE — Therapy (Signed)
[facility] [address] [place], [place], [address] Phone: [phone]   Fax:  [phone]  Physical Therapy Treatment  Patient Details  Name: Jeremy Verville [facility] Sr. MRN: [id number] Date of Birth: 02-22-1971 Referring Provider: [place], Betty G, MD  Encounter Date: 09/05/2017      PT End of Session - 09/05/17 0808    Visit Number 2   Date for PT Re-Evaluation 10/25/17   PT Start Time 0803   PT Stop Time 0843   PT Time Calculation (min) 40 min   Activity Tolerance Patient tolerated treatment well   Behavior During Therapy [facility] for tasks assessed/performed      Past Medical History:  Diagnosis Date  . Hyperlipidemia     History reviewed. No pertinent surgical history.  There were no vitals filed for this visit.      Subjective Assessment - 09/05/17 0806    Subjective I feel like the range is better, I still feel some pain.     Limitations House hold activities   Patient Stated Goals strength and full ROM   Currently in Pain? No/denies                         [facility] Adult PT Treatment/Exercise - 09/05/17 0001      Shoulder Exercises: Prone   Other Prone Exercises I, Y, T, on pball - 2x10     Shoulder Exercises: Standing   External Rotation Strengthening;Left;20 reps;Theraband   Theraband Level (Shoulder External Rotation) Level 2 (Red)   Extension Strengthening;Left;20 reps;Theraband   Theraband Level (Shoulder Extension) Level 2 (Red)   Row Strengthening;20 reps;Theraband   Theraband Level (Shoulder Row) Level 2 (Red)     Shoulder Exercises: ROM/Strengthening   UBE (Upper Arm Bike) L2 x 4 min - backwards   Over Head Lace rolling ball overhead - shoulder flex     Manual Therapy   Soft tissue mobilization anterior delt and pec - left side only                PT Education - 09/05/17 0842    Education provided Yes   Education Details HEP   Person(s) Educated Patient    Methods Explanation;Verbal cues;Handout;Demonstration   Comprehension Verbalized understanding;Returned demonstration          PT Short Term Goals - 09/05/17 0809      PT SHORT TERM GOAL #1   Title pt will be independent with initial HEP   Time 4   Period Weeks   Status Achieved     PT SHORT TERM GOAL #2   Title pt will report 25% reduction in stiffness   Baseline feels like less stiffness   Time 4   Period Weeks   Status Achieved     PT SHORT TERM GOAL #3   Title Pt will report 25% less pain and popping in shoulder   Time 4   Period Weeks   Status Achieved           PT Long Term Goals - 08/30/17 6962      PT LONG TERM GOAL #1   Title pt will be independent in advanced HEP   Time 8   Period Weeks   Status New   Target Date 10/25/17     PT LONG TERM GOAL #2   Title pt will be able to perform tasks like washing car with outreached arm without pain   Time 8  Period Weeks   Status New   Target Date 10/25/17     PT LONG TERM GOAL #3   Title pt will be < or = to 26% limited shown by FOTO score   Time 8   Period Weeks   Status New   Target Date 10/25/17     PT LONG TERM GOAL #4   Title Pt will have full flexion and abduction ROM equal to Rt side   Time 8   Period Weeks   Status New   Target Date 10/25/17     PT LONG TERM GOAL #5   Title Pt will have 5/5 MMT Lt shoulder flexion without pain or popping   Time 8   Period Weeks   Status New   Target Date 10/25/17               Plan - 09/05/17 0810    Clinical Impression Statement Patient is doing well and performed exercises in HEP correctly.  He fatigues with flexion and ER.  Patient responds well to verbal cues and education on purpose of exercises.  He continues to need skilled PT to improve ROM and shoulder stability.   Rehab Potential Excellent   PT Treatment/Interventions ADLs/Self Care Home Management;Cryotherapy;Electrical Stimulation;Iontophoresis /ml Dexamethasone;Moist  Heat;Ultrasound;Neuromuscular re-education;Dry needling;Taping;Manual techniques;Therapeutic activities;Therapeutic exercise;Passive range of motion   PT Next Visit Plan scap strength, Shoulder ROM and STM as needed   Consulted and Agree with Plan of Care Patient      Patient will benefit from skilled therapeutic intervention in order to improve the following deficits and impairments:  Decreased range of motion, Decreased strength, Pain, Increased muscle spasms  Visit Diagnosis: Acute pain of left shoulder  Stiffness of left shoulder, not elsewhere classified  Muscle weakness (generalized)     Problem List Patient Active Problem List   Diagnosis Date Noted  . Hyperlipidemia 08/20/2016    Vincente Poli, PT 09/05/2017, 9:36 AM  [facility] [address] [place], [place], [address] Phone: [phone]   Fax:  [phone]  Name: Jeremy Cromie [facility] Sr. MRN: [id number] Date of Birth: 14-Apr-1971

## 2017-09-12 ENCOUNTER — Ambulatory Visit: Payer: 59 | Admitting: Physical Therapy

## 2017-09-12 DIAGNOSIS — M25512 Pain in left shoulder: Secondary | ICD-10-CM

## 2017-09-12 DIAGNOSIS — M25612 Stiffness of left shoulder, not elsewhere classified: Secondary | ICD-10-CM

## 2017-09-12 DIAGNOSIS — M6281 Muscle weakness (generalized): Secondary | ICD-10-CM

## 2017-09-12 NOTE — Patient Instructions (Signed)
   Shoulder Stabilization  While standing with an upright posture, press the ball against the wall and slowly glide your hand on the ball in directions listed below: up/down,  side-to-side circles left/right 20x each way    ELASTIC BAND BILATERAL HORIZONTAL ABDUCTION  While holding an elastic band with your elbows straight and in front of your body, pull your arms apart and towards the side. 20x    SIDELYING EXTERNAL ROTATION WITH TOWEL - ER  Lie on your side with your elbow bent to 90 degrees. Place a rolled up towel between your arm and the side your body as shown.   Squeeze your shoulder blade back and down toward your buttocks and hold that position.   Next, roll your arm upwards from your stomach area towards the ceiling while maintaining your arm against the towel and with your shoulder blade held down and back the entire time. Lower your arm and repeat.     SCAPULAR PROTRACTION - FREE WEIGHT - SERRATUS PUNCHES  Lie on your back holding a small free weight or soup can with your arm extended out in front of your body and towards the ceiling. While keeping your elbow straight, protract your shoulders forward towards the ceiling and then lower back down in a control motion.   Do not allow your shoulder to raise towards your ears.   Keep your elbow straight the entire time.

## 2017-09-12 NOTE — Therapy (Signed)
[facility] [address] [place], [place], [address] Phone: [phone]   Fax:  [phone]  Physical Therapy Treatment  Patient Details  Name: Jeremy Sluka [facility] Sr. MRN: [id number] Date of Birth: [birth year] Referring Provider: [place], Betty G, MD  Encounter Date: 09/12/2017      PT End of Session - 09/12/17 0848    Visit Number 3   Date for PT Re-Evaluation 10/25/17   PT Start Time 0847   PT Stop Time 0927   PT Time Calculation (min) 40 min   Activity Tolerance Patient tolerated treatment well   Behavior During Therapy [facility] for tasks assessed/performed      Past Medical History:  Diagnosis Date  . Hyperlipidemia     No past surgical history on file.  There were no vitals filed for this visit.      Subjective Assessment - 09/12/17 0851    Subjective I was cleaning the bathtub and feeling it a little bit.   Limitations House hold activities   Currently in Pain? No/denies                         [facility] Adult PT Treatment/Exercise - 09/12/17 0001      Shoulder Exercises: Prone   Retraction Strengthening;Left;15 reps;Weights   Retraction Weight (lbs) 3   Flexion Strengthening;Left;15 reps;Weights   Flexion Weight (lbs) 2   Extension Strengthening;Left;15 reps;Weights   Extension Weight (lbs) 3   Horizontal ABduction 1 Strengthening;Left;15 reps;Weights   Horizontal ABduction 1 Weight (lbs) 3     Shoulder Exercises: Sidelying   External Rotation Strengthening;Left;15 reps;Weights   External Rotation Weight (lbs) 3     Shoulder Exercises: Standing   Horizontal ABduction Strengthening;Both;20 reps;Theraband  single arm   Theraband Level (Shoulder Horizontal ABduction) Level 3 (Green)   External Rotation Strengthening;Left;20 reps;Theraband  arm at 45   Theraband Level (Shoulder External Rotation) Level 3 (Green)   Row [employer];Theraband   Theraband Level  (Shoulder Row) Level 3 (Green)   Other Standing Exercises shoulder stability circles at wall     Shoulder Exercises: ROM/Strengthening   UBE (Upper Arm Bike) L2 x 4 x 4 min - backwards/forwards     Shoulder Exercises: Stretch   Corner Stretch 5 reps;10 seconds  doorway                PT Education - 09/12/17 [phone]    Education provided Yes   Education Details serratus, circles, SL ER, horizontal ABduction   Person(s) Educated Patient   Methods Explanation;Demonstration;Verbal cues;Handout   Comprehension Verbalized understanding;Returned demonstration          PT Short Term Goals - 09/05/17 0809      PT SHORT TERM GOAL #1   Title pt will be independent with initial HEP   Time 4   Period Weeks   Status Achieved     PT SHORT TERM GOAL #2   Title pt will report 25% reduction in stiffness   Baseline feels like less stiffness   Time 4   Period Weeks   Status Achieved     PT SHORT TERM GOAL #3   Title Pt will report 25% less pain and popping in shoulder   Time 4   Period Weeks   Status Achieved           PT Long Term Goals - 09/12/17 9528      PT LONG TERM GOAL #1   Title  pt will be independent in advanced HEP   Time 8   Period Weeks   Status On-going     PT LONG TERM GOAL #2   Title pt will be able to perform tasks like washing car with outreached arm without pain   Time 8   Period Weeks   Status On-going     PT LONG TERM GOAL #3   Title pt will be < or = to 26% limited shown by FOTO score   Time 8   Period Weeks   Status On-going     PT LONG TERM GOAL #4   Title Pt will have full flexion and abduction ROM equal to Rt side   Time 8   Period Weeks   Status On-going     PT LONG TERM GOAL #5   Title Pt will have 5/5 MMT Lt shoulder flexion without pain or popping   Time 8   Period Weeks   Status On-going               Plan - 09/12/17 0855    Clinical Impression Statement Patient feels much better overall 40% improved.  He is  doing well with exercises and able to progress to more difficulty exercises with increased resistance today.  He needs some cues to keep scapula from elevating excessively during exercises.  He needs skilled PT in order to return to all functional activities such as cleaning without pain.   Rehab Potential Excellent   PT Treatment/Interventions ADLs/Self Care Home Management;Cryotherapy;Electrical Stimulation;Iontophoresis /ml Dexamethasone;Moist Heat;Ultrasound;Neuromuscular re-education;Dry needling;Taping;Manual techniques;Therapeutic activities;Therapeutic exercise;Passive range of motion   PT Next Visit Plan scap strength, Shoulder ROM and STM as needed   Consulted and Agree with Plan of Care Patient      Patient will benefit from skilled therapeutic intervention in order to improve the following deficits and impairments:  Decreased range of motion, Decreased strength, Pain, Increased muscle spasms  Visit Diagnosis: Acute pain of left shoulder  Stiffness of left shoulder, not elsewhere classified  Muscle weakness (generalized)     Problem List Patient Active Problem List   Diagnosis Date Noted  . Hyperlipidemia 08/20/2016    Vincente Poli, PT 09/12/2017, 10:02 AM  [facility] [address] [place], [place], [address] Phone: [phone]   Fax:  [phone]  Name: Jeremy Hetz [facility] Sr. MRN: [id number] Date of Birth: March 28, 1971

## 2017-09-19 ENCOUNTER — Ambulatory Visit: Payer: 59 | Admitting: Physical Therapy

## 2017-09-19 DIAGNOSIS — M25512 Pain in left shoulder: Secondary | ICD-10-CM | POA: Diagnosis not present

## 2017-09-19 DIAGNOSIS — M25612 Stiffness of left shoulder, not elsewhere classified: Secondary | ICD-10-CM

## 2017-09-19 DIAGNOSIS — M6281 Muscle weakness (generalized): Secondary | ICD-10-CM

## 2017-09-19 NOTE — Therapy (Signed)
[facility] [address] [place], [place], [address] Phone: [phone]   Fax:  [phone]  Physical Therapy Treatment  Patient Details  Name: Jeremy FlesherKwesi [name] [facility] MRN: [id number] Date of Birth: 1971/09/20 Referring Provider: [place], Betty G, MD  Encounter Date: 09/19/2017      PT End of Session - 09/19/17 0820    Visit Number 4   Date for PT Re-Evaluation 10/25/17   PT Start Time 0810  Pt arrived late    PT Stop Time 0844   PT Time Calculation (min) 34 min   Activity Tolerance Patient tolerated treatment well;No increased pain   Behavior During Therapy WFL for tasks assessed/performed      Past Medical History:  Diagnosis Date  . Hyperlipidemia     No past surgical history on file.  There were no vitals filed for this visit.      Subjective Assessment - 09/19/17 0813    Subjective Pt reports no pain currently, and he does feel things are improving gradually.    Limitations House hold activities   Currently in Pain? No/denies                         [facility]/Exercise - 09/19/17 0001      Shoulder Exercises: Supine   Other Supine Exercises Snow angels x15 reps over foam roll x15 reps      Shoulder Exercises: Prone   Other Prone Exercises Incline closed chain serratus punches 2x20 reps, BUE      Shoulder Exercises: Sidelying   Other Sidelying Exercises thoracic rotation stretch x15 reps each side.      Shoulder Exercises: Standing   Horizontal ABduction 15 reps;Both   Theraband Level (Shoulder Horizontal ABduction) Level 3 (Green)   Horizontal ABduction Limitations x2 sets    ABduction Both;10 reps;Strengthening   Theraband Level (Shoulder ABduction) Level 3 (Green)   ABduction Limitations green TB resistance into horizontal abduction    Row 20 reps;Both   Theraband Level (Shoulder Row) Level 3 (Green)   Other Standing Exercises serratus punches 2x20  reps with green TB      Manual Therapy   Soft tissue mobilization Lt latissimus/teres TPR during passive shoulder elevation                 PT Education - 09/19/17 0839    Education provided Yes   Education Details implications for today's exercises; addition of thoracic rotation; technique with therex   Person(s) Educated Patient   Methods Explanation;Verbal cues;Tactile cues;Handout   Comprehension Verbalized understanding;Returned demonstration          PT Short Term Goals - 09/05/17 0809      PT SHORT TERM GOAL #1   Title pt will be independent with initial HEP   Time 4   Period Weeks   Status Achieved     PT SHORT TERM GOAL #2   Title pt will report 25% reduction in stiffness   Baseline feels like less stiffness   Time 4   Period Weeks   Status Achieved     PT SHORT TERM GOAL #3   Title Pt will report 25% less pain and popping in shoulder   Time 4   Period Weeks   Status Achieved           PT Long Term Goals - 09/12/17 [id number]      PT LONG TERM GOAL #1   Title pt  will be independent in advanced HEP   Time 8   Period Weeks   Status On-going     PT LONG TERM GOAL #2   Title pt will be able to perform tasks like washing car with outreached arm without pain   Time 8   Period Weeks   Status On-going     PT LONG TERM GOAL #3   Title pt will be < or = to 26% limited shown by FOTO score   Time 8   Period Weeks   Status On-going     PT LONG TERM GOAL #4   Title Pt will have full flexion and abduction ROM equal to Rt side   Time 8   Period Weeks   Status On-going     PT LONG TERM GOAL #5   Title Pt will have 5/5 MMT Lt shoulder flexion without pain or popping   Time 8   Period Weeks   Status On-going               Plan - 09/19/17 6644    Clinical Impression Statement Pt arrived late to his appointment today, however he continues to note steady improvements in shoulder ROM and pain. Continued with focus on lower trap activation as  well as scapular neuromuscular control with pt able to complete all of today's exercises without exacerbation in his symptoms. Pt continues to demonstrate limitations in latissimus and pectoralis flexibility/length which is likely contributing to his impingement symptoms. Will continue to address this in future sessions in order to progress towards goals.     Rehab Potential Excellent   PT Treatment/Interventions ADLs/Self Care Home Management;Cryotherapy;Electrical Stimulation;Iontophoresis 4mg /ml Dexamethasone;Moist Heat;Ultrasound;Neuromuscular re-education;Dry needling;Taping;Manual techniques;Therapeutic activities;Therapeutic exercise;Passive range of motion   PT Next Visit Plan scap strength, Shoulder ROM/chest and lat flexibility; STM as needed   PT Home Exercise Plan thoracic rotation stretch   Consulted and Agree with Plan of Care Patient      Patient will benefit from skilled therapeutic intervention in order to improve the following deficits and impairments:  Decreased range of motion, Decreased strength, Pain, Increased muscle spasms  Visit Diagnosis: Acute pain of left shoulder  Stiffness of left shoulder, not elsewhere classified  Muscle weakness (generalized)     Problem List Patient Active Problem List   Diagnosis Date Noted  . Hyperlipidemia 08/20/2016    9:31 AM,09/19/17 Marylyn Ishihara PT, DPT [facility]  [phone]  [facility] [address], [address] [place], [place], [address] Phone: [phone]   Fax:  [phone]  Name: Jeremy [name] MRN: [id number] Date of Birth: 1971-05-02

## 2017-09-19 NOTE — Patient Instructions (Signed)
   Thoracic rotation stretch  Start on your side with palms together.  Inhale to lift arm up toward ceiling. Exhale to open arm out to side.    x15 reps each direction, 2x/day  Keep knees towards the floor.

## 2017-09-26 ENCOUNTER — Ambulatory Visit: Payer: 59 | Admitting: Physical Therapy

## 2017-09-26 ENCOUNTER — Encounter: Payer: Self-pay | Admitting: Physical Therapy

## 2017-09-26 DIAGNOSIS — M25512 Pain in left shoulder: Secondary | ICD-10-CM | POA: Diagnosis not present

## 2017-09-26 DIAGNOSIS — M6281 Muscle weakness (generalized): Secondary | ICD-10-CM

## 2017-09-26 DIAGNOSIS — M25612 Stiffness of left shoulder, not elsewhere classified: Secondary | ICD-10-CM

## 2017-09-26 NOTE — Patient Instructions (Signed)
   PRONE RETRACTION EXTENSION - PRONE I  Lying face down with your arms by the side of your body, slowly move your arms upward towards the ceiling as you squeeze your shoulder blades towards your spine and downward.     Prone Abduction  Lying on your stomach raise your arms up towards the ceiling, squeezing your shoulder blades together and maintain straight elbows.    EXTERNAL ROTATION: Side-Lying (Active)    Lie on right side, top arm bent to 90, elbow against side, hand forward. Rotate forearm up as high as possible. Use _3__ lbs. Complete _3__ sets of __10_ repetitions. Perform __1_ sessions per day.  Copyright  VHI. All rights reserved.

## 2017-09-26 NOTE — Therapy (Signed)
[facility] [address], [address] [place], [place], [address] Phone: [phone]   Fax:  [phone]  Physical Therapy Treatment  Patient Details  Name: Jeremy FlesherKwesi [name] [facility] Sr[facility] MRN: [id number] Date of Birth: Jun 20, 1971 Referring Provider: [place], Betty G, MD  Encounter Date: 09/26/2017      PT End of Session - 09/26/17 0803    Visit Number 5   Date for PT Re-Evaluation 10/25/17   PT Start Time 0803   PT Stop Time 0844   PT Time Calculation (min) 41 min   Activity Tolerance Patient tolerated treatment well;No increased pain   Behavior During Therapy WFL for tasks assessed/performed      Past Medical History:  Diagnosis Date  . Hyperlipidemia     History reviewed. No pertinent surgical history.  There were no vitals filed for this visit.      Subjective Assessment - 09/26/17 0804    Subjective I washed the car this weekend and didn't feel any of the pinches that I was feeling before.  I am slowly getting the ROM back   Limitations House hold activities   Patient Stated Goals strength and full ROM   Currently in Pain? No/denies            [facility] - 09/26/17 0001      AROM   Left Shoulder Flexion 145 Degrees   Left Shoulder ABduction 150 Degrees                     [facility] Adult PT Treatment/Exercise - 09/26/17 0001      Shoulder Exercises: Prone   Retraction Strengthening;Left;15 reps;Weights   Retraction Weight (lbs) 4   Extension Strengthening;Left;15 reps;Weights   Extension Weight (lbs) 4   Horizontal ABduction 1 Strengthening;Left;15 reps;Weights   Horizontal ABduction 1 Weight (lbs) 3   Other Prone Exercises push up 10x; push up with serratus push at the end 5x     Shoulder Exercises: Sidelying   External Rotation Strengthening;Left;15 reps;Weights   External Rotation Weight (lbs) 3     Shoulder Exercises: Standing   Horizontal ABduction 15 reps;Both   Theraband  Level (Shoulder Horizontal ABduction) Level 3 (Green)   External Rotation Strengthening;Left;20 reps;Weights   External Rotation Weight (lbs) 15   Internal Rotation Strengthening;Left;20 reps;Weights   Internal Rotation Weight (lbs) 15   ABduction Strengthening;Both;20 reps;Theraband   Theraband Level (Shoulder ABduction) Level 3 (Green)   Extension Strengthening;Both;20 reps;Weights   Extension Weight (lbs) 20   Row Strengthening;Both;20 reps;Weights   Row Weight (lbs) 25   Other Standing Exercises ER with 45 deg abduction - 1 plate; 3 x 10     Shoulder Exercises: ROM/Strengthening   UBE (Upper Arm Bike) L2 x 3 x 3 min - backwards/forwards     Shoulder Exercises: Stretch   Other Shoulder Stretches child pose lat stretch - 2x 20 sec forward                   PT Short Term Goals - 09/05/17 0809      PT SHORT TERM GOAL #1   Title pt will be independent with initial HEP   Time 4   Period Weeks   Status Achieved     PT SHORT TERM GOAL #2   Title pt will report 25% reduction in stiffness   Baseline feels like less stiffness   Time 4   Period Weeks   Status Achieved     PT SHORT  TERM GOAL #3   Title Pt will report 25% less pain and popping in shoulder   Time 4   Period Weeks   Status Achieved           PT Long Term Goals - 09/26/17 1610      PT LONG TERM GOAL #1   Title pt will be independent in advanced HEP   Time 8   Period Weeks   Status On-going     PT LONG TERM GOAL #2   Title pt will be able to perform tasks like washing car with outreached arm without pain   Time 8   Period Weeks   Status On-going     PT LONG TERM GOAL #3   Title pt will be < or = to 26% limited shown by FOTO score   Time 8   Period Weeks   Status On-going     PT LONG TERM GOAL #4   Title Pt will have full flexion and abduction ROM equal to Rt side   Time 8   Period Weeks   Status On-going     PT LONG TERM GOAL #5   Title Pt will have 5/5 MMT Lt shoulder flexion  without pain or popping   Baseline I haven't felt the popping as much, and I can do the incline bench press without pain   Time 8   Period Weeks   Status On-going               Plan - 09/26/17 0845    Clinical Impression Statement Pt is doing well and has improved AROM with 10 more degrees of shoulder Abduction and flexion as reported above.  He is able to demonstrate improved scapular stability and increased resistance.  Pt continues to have some scapular instability and winging with full pushup and has some pinching at end range of motion with AROM.  He will benefit from skilled PT in order to get back to his activities pain free.   PT Treatment/Interventions ADLs/Self Care Home Management;Cryotherapy;Electrical Stimulation;Iontophoresis 4mg /ml Dexamethasone;Moist Heat;Ultrasound;Neuromuscular re-education;Dry needling;Taping;Manual techniques;Therapeutic activities;Therapeutic exercise;Passive range of motion   PT Next Visit Plan scap strength, Shoulder ROM/chest and lat flexibility; STM as needed   PT Home Exercise Plan add lat strech in child pose to HEP   Consulted and Agree with Plan of Care Patient      Patient will benefit from skilled therapeutic intervention in order to improve the following deficits and impairments:  Decreased range of motion, Decreased strength, Pain, Increased muscle spasms  Visit Diagnosis: Acute pain of left shoulder  Stiffness of left shoulder, not elsewhere classified  Muscle weakness (generalized)     Problem List Patient Active Problem List   Diagnosis Date Noted  . Hyperlipidemia 08/20/2016    Vincente Poli, PT 09/26/2017, 8:53 AM  [facility] [address] [place], [place], [address] Phone: [phone]   Fax:  [phone]  Name: Jeremy [facility] Sr. MRN: [id number] Date of Birth: 07/27/1971

## 2017-10-03 ENCOUNTER — Encounter: Payer: 59 | Admitting: Physical Therapy

## 2017-10-04 ENCOUNTER — Encounter: Payer: Self-pay | Admitting: Physical Therapy

## 2017-10-04 ENCOUNTER — Ambulatory Visit: Payer: 59 | Attending: Family Medicine | Admitting: Physical Therapy

## 2017-10-04 DIAGNOSIS — M25512 Pain in left shoulder: Secondary | ICD-10-CM | POA: Insufficient documentation

## 2017-10-04 DIAGNOSIS — M25612 Stiffness of left shoulder, not elsewhere classified: Secondary | ICD-10-CM | POA: Diagnosis present

## 2017-10-04 DIAGNOSIS — M6281 Muscle weakness (generalized): Secondary | ICD-10-CM | POA: Diagnosis present

## 2017-10-04 NOTE — Therapy (Signed)
[facility] [address], [address] [place], [place], [address] Phone: [phone]   Fax:  [phone]  Physical Therapy Treatment  Patient Details  Name: Jeremy FlesherKwesi Howard [facility] MRN: [id number] Date of Birth: [birth year] Referring Provider: [place], Betty G, MD   Encounter Date: 10/04/2017  PT End of Session - 10/04/17 0803    Visit Number  6    Date for PT Re-Evaluation  10/25/17    PT Start Time  0802    PT Stop Time  0843    PT Time Calculation (min)  41 min    Activity Tolerance  Patient tolerated treatment well;No increased pain    Behavior During Therapy  WFL for tasks assessed/performed       Past Medical History:  Diagnosis Date  . Hyperlipidemia     History reviewed. No pertinent surgical history.  There were no vitals filed for this visit.  Subjective Assessment - 10/04/17 0803    Subjective  I have been doing the exercises and stretching it.  No pain but I am still feeling the stiffness.    Limitations  House hold activities    Patient Stated Goals  strength and full ROM    Currently in Pain?  No/denies                      [facility]/Exercise - 10/04/17 0001      Shoulder Exercises: Prone   Other Prone Exercises  childs pose and sleeper stretch      Shoulder Exercises: Standing   External Rotation  Strengthening;Left;20 reps;Weights    External Rotation Weight (lbs)  20    Internal Rotation  Strengthening;Left;20 reps;Weights    Internal Rotation Weight (lbs)  20    Other Standing Exercises  ER with 90 deg abduction - red band; 3 x 10    Other Standing Exercises  body blade - horizontal AB/ADd; IR/ER; overhead - 2x30 sec      Manual Therapy   Soft tissue mobilization  left shoulder : RTC, upper trap, pecs, lats, rhomboids       Trigger Point Dry Needling - 10/04/17 0835    Consent Given?  Yes    Education Handout Provided  Yes    Muscles Treated Upper Body  Upper  trapezius;Pectoralis major;Rhomboids;Infraspinatus;Supraspinatus;Subscapularis    Upper Trapezius Response  Palpable increased muscle length    Pectoralis Major Response  Palpable increased muscle length    Rhomboids Response  Twitch response elicited;Palpable increased muscle length    Supraspinatus Response  Twitch response elicited;Palpable increased muscle length    Infraspinatus Response  Palpable increased muscle length    Subscapularis Response  Twitch response elicited;Palpable increased muscle length           PT Education - 10/04/17 0808    Education provided  Yes    Education Details  dry needling; child's pose, sleeper stretch    Person(s) Educated  Patient    Methods  Explanation;Demonstration;Handout;Verbal cues    Comprehension  Verbalized understanding       PT Short Term Goals - 09/05/17 0809      PT SHORT TERM GOAL #1   Title  pt will be independent with initial HEP    Time  4    Period  Weeks    Status  Achieved      PT SHORT TERM GOAL #2   Title  pt will report 25% reduction in stiffness  Baseline  feels like less stiffness    Time  4    Period  Weeks    Status  Achieved      PT SHORT TERM GOAL #3   Title  Pt will report 25% less pain and popping in shoulder    Time  4    Period  Weeks    Status  Achieved        PT Long Term Goals - 09/26/17 [id number]      PT LONG TERM GOAL #1   Title  pt will be independent in advanced HEP    Time  8    Period  Weeks    Status  On-going      PT LONG TERM GOAL #2   Title  pt will be able to perform tasks like washing car with outreached arm without pain    Time  8    Period  Weeks    Status  On-going      PT LONG TERM GOAL #3   Title  pt will be < or = to 26% limited shown by FOTO score    Time  8    Period  Weeks    Status  On-going      PT LONG TERM GOAL #4   Title  Pt will have full flexion and abduction ROM equal to Rt side    Time  8    Period  Weeks    Status  On-going      PT LONG TERM  GOAL #5   Title  Pt will have 5/5 MMT Lt shoulder flexion without pain or popping    Baseline  I haven't felt the popping as much, and I can do the incline bench press without pain    Time  8    Period  Weeks    Status  On-going            Plan - 10/04/17 0806    Clinical Impression Statement  Patient is demonstrating decrease in symptoms overall and no reports of pain since previous session.  Pt continues to have some stiffness in shoulder.  He responded well to manual and dry needling therapy today.  He was able to progress strength and had no increased pain.  Pt continues to benefit from skilled PT to increase ROM and strength for full return to exercise program and functional activiites.    Rehab Potential  Excellent    PT Treatment/Interventions  ADLs/Self Care Home Management;Cryotherapy;Electrical Stimulation;Iontophoresis 4mg /ml Dexamethasone;Moist Heat;Ultrasound;Neuromuscular re-education;Dry needling;Taping;Manual techniques;Therapeutic activities;Therapeutic exercise;Passive range of motion    PT Next Visit Plan  f/u on dry needling, ROM, scap strength    Consulted and Agree with Plan of Care  Patient       Patient will benefit from skilled therapeutic intervention in order to improve the following deficits and impairments:  Decreased range of motion, Decreased strength, Pain, Increased muscle spasms  Visit Diagnosis: Acute pain of left shoulder  Stiffness of left shoulder, not elsewhere classified  Muscle weakness (generalized)     Problem List Patient Active Problem List   Diagnosis Date Noted  . Hyperlipidemia 08/20/2016    Vincente PoliJakki Crosser, PT 10/04/2017, 8:48 AM  [facility] [address] [place], [place], [address] Phone: [phone]   Fax:  [phone]  Name: Jeremy FlesherKwesi Howard [facility] MRN: [id number] Date of Birth: Feb 13, 1971

## 2017-10-04 NOTE — Patient Instructions (Addendum)
Trigger Point Dry Needling  . What is Trigger Point Dry Needling (DN)? o DN is a physical therapy technique used to treat muscle pain and dysfunction. Specifically, DN helps deactivate muscle trigger points (muscle knots).  o A thin filiform needle is used to penetrate the skin and stimulate the underlying trigger point. The goal is for a local twitch response (LTR) to occur and for the trigger point to relax. No medication of any kind is injected during the procedure.   . What Does Trigger Point Dry Needling Feel Like?  o The procedure feels different for each individual patient. Some patients report that they do not actually feel the needle enter the skin and overall the process is not painful. Very mild bleeding may occur. However, many patients feel a deep cramping in the muscle in which the needle was inserted. This is the local twitch response.   Marland Kitchen. How Will I feel after the treatment? o Soreness is normal, and the onset of soreness may not occur for a few hours. Typically this soreness does not last longer than two days.  o Bruising is uncommon, however; ice can be used to decrease any possible bruising.  o In rare cases feeling tired or nauseous after the treatment is normal. In addition, your symptoms may get worse before they get better, this period will typically not last longer than 24 hours.   . What Can I do After My Treatment? o Increase your hydration by drinking more water for the next 24 hours. o You may place ice or heat on the areas treated that have become sore, however, do not use heat on inflamed or bruised areas. Heat often brings more relief post needling. o You can continue your regular activities, but vigorous activity is not recommended initially after the treatment for 24 hours. o DN is best combined with other physical therapy such as strengthening, stretching, and other therapies.    [facility] [address], [address] [place], [place]  [address] Phone # [phone] Fax [phone]     Child pose  On hands and knees, rock back onto heels stretching back as far as comfortable. Hold 30-60 sec, repeat 3x You can walk hands over to either side for greater stretch in lats    SIDELYING INTERNAL ROTATION STRETCH - IR SLEEPER STRETCH  Start by lying on your side with the affected arm on the bottom.Your affected arm should be bent at the elbow and forearm pointed upwards towards the ceiling as shown.Next, use your unaffected arm to gently draw your affected forearm towards the table or bed for an inward stretch.   Hold 30 sec repeat 3x  [facility] [address], [address] [place], [place] [address] Phone # [phone] Fax [phone]

## 2017-10-17 ENCOUNTER — Encounter: Payer: Self-pay | Admitting: Physical Therapy

## 2017-10-17 ENCOUNTER — Ambulatory Visit: Payer: 59 | Admitting: Physical Therapy

## 2017-10-17 DIAGNOSIS — M25612 Stiffness of left shoulder, not elsewhere classified: Secondary | ICD-10-CM

## 2017-10-17 DIAGNOSIS — M25512 Pain in left shoulder: Secondary | ICD-10-CM

## 2017-10-17 DIAGNOSIS — M6281 Muscle weakness (generalized): Secondary | ICD-10-CM

## 2017-10-17 NOTE — Therapy (Signed)
[facility] [address] [place], [place], [address] Phone: [phone]   Fax:  [phone]  Physical Therapy Treatment  Patient Details  Name: Jeremy FlesherKwesi [name] [facility] MRN: [id number] Date of Birth: 1970-12-11 Referring Provider: [place]Jordan, Betty G, MD   Encounter Date: 10/17/2017  PT End of Session - 10/17/17 0812    Visit Number  7    Date for PT Re-Evaluation  10/25/17    PT Start Time  0804    PT Stop Time  0839    PT Time Calculation (min)  35 min    Activity Tolerance  Patient tolerated treatment well;No increased pain    Behavior During Therapy  WFL for tasks assessed/performed       Past Medical History:  Diagnosis Date  . Hyperlipidemia     History reviewed. No pertinent surgical history.  There were no vitals filed for this visit.  Subjective Assessment - 10/17/17 0810    Subjective  Pt states the dry needling helped him have more ROM last time.  Feeling better overall, still some stiffness.    Limitations  House hold activities    Patient Stated Goals  strength and full ROM    Currently in Pain?  No/denies         [facility] Assessment - 10/17/17 0001      AROM   Left Shoulder Flexion  152 Degrees    Left Shoulder ABduction  146 Degrees                  [facility] Adult PT Treatment/Exercise - 10/17/17 0001      Self-Care   Self-Care  Other Self-Care Comments    Other Self-Care Comments   HEP      Neuro Re-ed    Neuro Re-ed Details   posture and scap stability during exercises      Shoulder Exercises: Prone   Other Prone Exercises  prone on green ball: W and T -2x10      Shoulder Exercises: Standing   Other Standing Exercises  power tower: row,ext, ER 20lb - 30x each      Manual Therapy   Soft tissue mobilization  left shoulder : RTC, upper trap, pecs, lats, rhomboids       Trigger Point Dry Needling - 10/17/17 0820    Consent Given?  Yes    Upper Trapezius Response   Twitch reponse elicited;Palpable increased muscle length    Pectoralis Major Response  Twitch response elicited;Palpable increased muscle length    Rhomboids Response  Twitch response elicited;Palpable increased muscle length    Supraspinatus Response  Twitch response elicited;Palpable increased muscle length    Infraspinatus Response  Twitch response elicited;Palpable increased muscle length           PT Education - 10/17/17 0841    Education provided  Yes    Education Details  reviewed HEP RTC strengthening and pec stretch    Person(s) Educated  Patient    Methods  Explanation    Comprehension  Verbalized understanding       PT Short Term Goals - 09/05/17 0809      PT SHORT TERM GOAL #1   Title  pt will be independent with initial HEP    Time  4    Period  Weeks    Status  Achieved      PT SHORT TERM GOAL #2   Title  pt will report 25% reduction in stiffness  Baseline  feels like less stiffness    Time  4    Period  Weeks    Status  Achieved      PT SHORT TERM GOAL #3   Title  Pt will report 25% less pain and popping in shoulder    Time  4    Period  Weeks    Status  Achieved        PT Long Term Goals - 10/17/17 16100846      PT LONG TERM GOAL #1   Title  pt will be independent in advanced HEP    Status  On-going      PT LONG TERM GOAL #2   Title  pt will be able to perform tasks like washing car with outreached arm without pain    Time  8    Period  Weeks    Status  Achieved      PT LONG TERM GOAL #3   Title  pt will be < or = to 26% limited shown by FOTO score    Status  On-going      PT LONG TERM GOAL #4   Title  Pt will have full flexion and abduction ROM equal to Rt side    Time  8    Period  Weeks    Status  Achieved      PT LONG TERM GOAL #5   Title  Pt will have 5/5 MMT Lt shoulder flexion without pain or popping    Time  8    Period  Weeks    Status  On-going            Plan - 10/17/17 0813    Clinical Impression Statement   Patient is demonstrating more ROM and repsonds well to manual techniques.  He continues to demonstrate improved ROM as noted in above assessment with equal ROM both sides.  He is feeling better with no reports of pain in the last few weeks.  He conitnues to have a little stiff feeling in the left shoulder and will benefit from dry needlingand manual for full functional mobility without restriction.  Pt will most likely discharge from PT next visit.    PT Treatment/Interventions  ADLs/Self Care Home Management;Cryotherapy;Electrical Stimulation;Iontophoresis 4mg /ml Dexamethasone;Moist Heat;Ultrasound;Neuromuscular re-education;Dry needling;Taping;Manual techniques;Therapeutic activities;Therapeutic exercise;Passive range of motion    PT Next Visit Plan  dry needling, ROM, scap strength, FOTO for discharge    Consulted and Agree with Plan of Care  Patient       Patient will benefit from skilled therapeutic intervention in order to improve the following deficits and impairments:  Decreased range of motion, Decreased strength, Pain, Increased muscle spasms  Visit Diagnosis: Acute pain of left shoulder  Stiffness of left shoulder, not elsewhere classified  Muscle weakness (generalized)     Problem List Patient Active Problem List   Diagnosis Date Noted  . Hyperlipidemia 08/20/2016    Jeremy Howard, PT 10/17/2017, 8:54 AM  [facility] [address] [place], [place], [address] Phone: [phone]   Fax:  [phone]  Name: Jeremy FlesherKwesi [name] [facility] MRN: [id number] Date of Birth: [birth year]

## 2017-10-24 ENCOUNTER — Ambulatory Visit: Payer: 59 | Admitting: Physical Therapy

## 2017-10-24 ENCOUNTER — Encounter: Payer: Self-pay | Admitting: Physical Therapy

## 2017-10-24 DIAGNOSIS — M25512 Pain in left shoulder: Secondary | ICD-10-CM | POA: Diagnosis not present

## 2017-10-24 DIAGNOSIS — M25612 Stiffness of left shoulder, not elsewhere classified: Secondary | ICD-10-CM

## 2017-10-24 DIAGNOSIS — M6281 Muscle weakness (generalized): Secondary | ICD-10-CM

## 2017-10-24 NOTE — Therapy (Signed)
[facility] [address] [place], [place], [address] Phone: [phone]   Fax:  [phone]  Physical Therapy Treatment  Patient Details  Name: Jeremy Gosling [facility]. MRN: [id number] Date of Birth: 07-17-1971 Referring Provider: [place], Betty G, MD   Encounter Date: 10/24/2017  PT End of Session - 10/24/17 0806    Visit Number  8    Date for PT Re-Evaluation  10/25/17    PT Start Time  0800    PT Stop Time  0840    PT Time Calculation (min)  40 min    Activity Tolerance  Patient tolerated treatment well;No increased pain    Behavior During Therapy  WFL for tasks assessed/performed       Past Medical History:  Diagnosis Date  . Hyperlipidemia     History reviewed. No pertinent surgical history.  There were no vitals filed for this visit.  Subjective Assessment - 10/24/17 0834    Subjective  Patient states he is feeling good and only very small amount of tightness that goes away after warming things up    Currently in Pain?  No/denies         [facility] PT Assessment - 10/24/17 0001      Strength   Right Shoulder Flexion  5/5    Right Shoulder External Rotation  5/5    Left Shoulder Flexion  5/5    Left Shoulder ABduction  5/5    Left Shoulder External Rotation  5/5                  [facility] Adult PT Treatment/Exercise - 10/24/17 0001      Shoulder Exercises: Prone   Retraction  Strengthening;Left;15 reps;Weights    Retraction Weight (lbs)  4    Extension  Strengthening;Left;15 reps;Weights    Extension Weight (lbs)  4    Horizontal ABduction 1  Strengthening;Left;15 reps;Weights    Horizontal ABduction 1 Weight (lbs)  3    Other Prone Exercises  prone on green ball: I, Y, T -2x15      Shoulder Exercises: Sidelying   External Rotation  Strengthening;Left;15 reps;Weights    External Rotation Weight (lbs)  3      Shoulder Exercises: Standing   External Rotation  Strengthening;Left;20  reps;Weights    External Rotation Weight (lbs)  20    Extension  Strengthening;Both;Weights;15 reps 2 sets    Extension Weight (lbs)  20    Row  Strengthening;Both;20 reps;Weights    Row Weight (lbs)  25    Other Standing Exercises  serratus punch standing - 20 lb - 30x      Shoulder Exercises: ROM/Strengthening   UBE (Upper Arm Bike)  L2 x 3 x 3 min - backwards/forwards               PT Short Term Goals - 09/05/17 0809      PT SHORT TERM GOAL #1   Title  pt will be independent with initial HEP    Time  4    Period  Weeks    Status  Achieved      PT SHORT TERM GOAL #2   Title  pt will report 25% reduction in stiffness    Baseline  feels like less stiffness    Time  4    Period  Weeks    Status  Achieved      PT SHORT TERM GOAL #3   Title  Pt will report 25% less  pain and popping in shoulder    Time  4    Period  Weeks    Status  Achieved        PT Long Term Goals - 10/24/17 0810      PT LONG TERM GOAL #1   Title  pt will be independent in advanced HEP    Time  8    Period  Weeks    Status  Achieved      PT LONG TERM GOAL #2   Title  pt will be able to perform tasks like washing car with outreached arm without pain    Time  8    Period  Weeks    Status  Achieved      PT LONG TERM GOAL #3   Title  pt will be < or = to 26% limited shown by FOTO score    Baseline  14%    Time  8    Period  Weeks    Status  Achieved      PT LONG TERM GOAL #4   Title  Pt will have full flexion and abduction ROM equal to Rt side    Time  8    Period  Weeks    Status  Achieved      PT LONG TERM GOAL #5   Title  Pt will have 5/5 MMT Lt shoulder flexion without pain or popping    Baseline  5/5 no pain or popping    Time  8    Period  Weeks    Status  Achieved            Plan - 10/24/17 0804    Clinical Impression Statement  Patient has achived all long term goals and is independent with all exercises.  He is recommended to discharge with HEP and continue  stretches and strengthening at home    Rehab Potential  Excellent    PT Treatment/Interventions  ADLs/Self Care Home Management;Cryotherapy;Electrical Stimulation;Iontophoresis 63m/ml Dexamethasone;Moist Heat;Ultrasound;Neuromuscular re-education;Dry needling;Taping;Manual techniques;Therapeutic activities;Therapeutic exercise;Passive range of motion    PT Next Visit Plan  discharged today    Consulted and Agree with Plan of Care  Patient       Patient will benefit from skilled therapeutic intervention in order to improve the following deficits and impairments:  Decreased range of motion, Decreased strength, Pain, Increased muscle spasms  Visit Diagnosis: Acute pain of left shoulder  Stiffness of left shoulder, not elsewhere classified  Muscle weakness (generalized)     Problem List Patient Active Problem List   Diagnosis Date Noted  . Hyperlipidemia 08/20/2016    JZannie Cove PT 10/24/2017, 8:46 AM  [facility] [address] [place] [address]Phone: [phone]  Fax:  [phone] Name: [facility] MRN: [id number] of Birth: [birth year] PHYSICAL THERAPY DISCHARGE SUMMARY  Visits from Start of Care: 8  Current functional level related to goals / functional outcomes: Goals achieved   Remaining deficits: See above details   Education / Equipment: HEP  Plan: Patient agrees to discharge.  Patient goals were met. Patient is being discharged due to meeting the stated rehab goals.  ?????         JGoogle PT 10/24/17 8:46 AM

## 2018-02-20 NOTE — Progress Notes (Signed)
HPI:  [name] is a 47 y.o.male here today for his routine physical examination.  Last CPE: 02/17/17 He lives his wife on with 4 children.  Regular exercise 3 or more times per week: He is jogging once per week and swimming once per week. Following a healthy diet: Not consistently.   Chronic medical problems: HLD and leukopenia.   Hx of STD's: Denies  Immunization History  Administered Date(s) Administered  . Tdap 02/16/2016     -Denies high alcohol intake, tobacco use, or Hx of illicit drug use.  -Concerns today:   Right hip pain.   3-4 months of intermittent sharp pain on anterior aspect of right hip. Pain seems to be exacerbated by prolonged sitting, alleviated by standing up and movement. It happens about 4-5 times per month and has been stable. Pain is sharp and last a few seconds. He has not try OTC medication.  No recent injury or unusual physical activity. He has not noted bulge area, testicular pain, numbness, tingling, or back pain.   Review of Systems  Constitutional: Negative for activity change, appetite change, fatigue, fever and unexpected weight change.  HENT: Negative for dental problem, nosebleeds, sore throat, trouble swallowing and voice change.   Eyes: Negative for redness and visual disturbance.  Respiratory: Negative for apnea, cough, shortness of breath and wheezing.   Cardiovascular: Negative for chest pain, palpitations and leg swelling.  Gastrointestinal: Negative for abdominal pain, blood in stool, nausea and vomiting.       No changes in bowel habits.  Endocrine: Negative for cold intolerance, heat intolerance, polydipsia, polyphagia and polyuria.  Genitourinary: Negative for decreased urine volume, dysuria, genital sores, hematuria and testicular pain.  Musculoskeletal: Positive for arthralgias. Negative for back pain, gait problem and joint swelling.  Skin: Negative for color change and rash.    Allergic/Immunologic: Negative for environmental allergies.  Neurological: Negative for syncope, weakness, numbness and headaches.  Hematological: Negative for adenopathy. Does not bruise/bleed easily.  Psychiatric/Behavioral: Negative for confusion and sleep disturbance. The patient is not nervous/anxious.   All other systems reviewed and are negative.    No current outpatient medications on file prior to visit.   No current facility-administered medications on file prior to visit.     Past Medical History:  Diagnosis Date  . Hyperlipidemia     History reviewed. No pertinent surgical history.  No Known Allergies  Family History  Problem Relation Age of Onset  . Cancer Mother 47       Colon  . Cancer Father        ? colon  . Hypertension Father   . Diabetes Father     Social History   Socioeconomic History  . Marital status: Married    Spouse name: Not on file  . Number of children: Not on file  . Years of education: Not on file  . Highest education level: Not on file  Occupational History  . Not on file  Social Needs  . Financial resource strain: Not on file  . Food insecurity:    Worry: Not on file    Inability: Not on file  . Transportation needs:    Medical: Not on file    Non-medical: Not on file  Tobacco Use  . Smoking status: Never Smoker  . Smokeless tobacco: Never Used  Substance and Sexual Activity  . Alcohol use: Yes  . Drug use: No  . Sexual activity: Yes    Birth control/protection:  Surgical  Lifestyle  . Physical activity:    Days per week: Not on file    Minutes per session: Not on file  . Stress: Not on file  Relationships  . Social connections:    Talks on phone: Not on file    Gets together: Not on file    Attends religious service: Not on file    Active member of club or organization: Not on file    Attends meetings of clubs or organizations: Not on file    Relationship status: Not on file  Other Topics Concern  . Not on  file  Social History Narrative  . Not on file     Vitals:   02/21/18 0811  BP: 130/83  Pulse: 68  Temp: 98.3 F (36.8 C)  SpO2: 95%   Body mass index is 26.32 kg/m.   Wt Readings from Last 3 Encounters:  02/21/18 178 lb 4 oz (80.9 kg)  04/27/17 171 lb 8 oz (77.8 kg)  02/21/17 175 lb 4 oz (79.5 kg)    Physical Exam  Nursing note and vitals reviewed. Constitutional: He is oriented to person, place, and time. He appears well-developed and well-nourished. No distress.  HENT:  Head: Atraumatic.  Right Ear: Hearing, tympanic membrane, external ear and ear canal normal.  Left Ear: Hearing, tympanic membrane, external ear and ear canal normal.  Mouth/Throat: Oropharynx is clear and moist and mucous membranes are normal.  Eyes: Pupils are equal, round, and reactive to light. Conjunctivae and EOM are normal.  Neck: Normal range of motion. No tracheal deviation present. No thyromegaly present.  Cardiovascular: Normal rate and regular rhythm.  No murmur heard. Pulses:      Dorsalis pedis pulses are 2+ on the right side, and 2+ on the left side.  Respiratory: Effort normal and breath sounds normal. No respiratory distress.  GI: Soft. He exhibits no mass. There is no tenderness. Hernia confirmed negative in the right inguinal area and confirmed negative in the left inguinal area.  Genitourinary: Testes normal.  Musculoskeletal: He exhibits no edema or tenderness.       Right hip: He exhibits normal range of motion, normal strength and no tenderness.  No major deformities appreciated and no signs of synovitis.  Lymphadenopathy:    He has no cervical adenopathy.       Right: No inguinal and no supraclavicular adenopathy present.       Left: No inguinal and no supraclavicular adenopathy present.  Neurological: He is alert and oriented to person, place, and time. He has normal strength. No cranial nerve deficit or sensory deficit. Coordination and gait normal.  Reflex Scores:       Bicep reflexes are 2+ on the right side and 2+ on the left side.      Patellar reflexes are 2+ on the right side and 2+ on the left side. Skin: Skin is warm. No erythema.  Psychiatric: He has a normal mood and affect.     ASSESSMENT AND PLAN:   Mr. Jeremy Howard was seen today for annual exam.  Diagnoses and all orders for this visit:   Lab Results  Component Value Date   CHOL 203 (H) 02/21/2018   HDL 56.00 02/21/2018   LDLCALC 134 (H) 02/21/2018   TRIG 64.0 02/21/2018   CHOLHDL 4 02/21/2018   Lab Results  Component Value Date   CREATININE 1.03 02/21/2018   BUN 19 02/21/2018   NA 140 02/21/2018   K 4.1 02/21/2018   CL 104 02/21/2018  CO2 29 02/21/2018   Lab Results  Component Value Date   WBC 2.8 (L) 02/21/2018   HGB 14.1 02/21/2018   HCT 42.0 02/21/2018   MCV 82.8 02/21/2018   PLT 211.0 02/21/2018    Routine general medical examination at a health care facility  We discussed the importance of regular physical activity and healthy diet for prevention of chronic illness and/or complications. Preventive guidelines reviewed. Vaccination up-to-date.  Next CPE in a year.   The 10-year ASCVD risk score Denman George DC Montez Hageman., et al., 2013) is: 4.3%   Values used to calculate the score:     Age: 71 years     Sex: Male     Is Non-Hispanic African American: Yes     Diabetic: No     Tobacco smoker: No     Systolic Blood Pressure: 130 mmHg     Is BP treated: No     HDL Cholesterol: 56 mg/dL     Total Cholesterol: 203 mg/dL  Hyperlipidemia, unspecified hyperlipidemia type  Mild. Continue nonpharmacologic treatment. Further recommendations will be given according to lab results.  -     Lipid panel  Diabetes mellitus screening -     Basic metabolic panel  Leukopenia, unspecified type  Further recommendations will be given according to lab results. We will continue following annually if stable.  -     CBC with Differential  Encounter for screening for HIV -     HIV  antibody  Right hip pain  We discussed possible etiologies, hip and inguinal examination today negative.   We will not able to reproduce pain. It seems musculoskeletal, stretching exercises recommended. Avoiding prolonged sitting may also help. Follow-up as needed.    Return in 1 year (on 02/22/2019).    Lianny Molter G. [place], MD  [facility]. [facility].

## 2018-02-21 ENCOUNTER — Encounter: Payer: Self-pay | Admitting: Family Medicine

## 2018-02-21 ENCOUNTER — Ambulatory Visit (INDEPENDENT_AMBULATORY_CARE_PROVIDER_SITE_OTHER): Payer: 59 | Admitting: Family Medicine

## 2018-02-21 VITALS — BP 130/83 | HR 68 | Temp 98.3°F | Ht 69.0 in | Wt 178.2 lb

## 2018-02-21 DIAGNOSIS — Z131 Encounter for screening for diabetes mellitus: Secondary | ICD-10-CM

## 2018-02-21 DIAGNOSIS — D72819 Decreased white blood cell count, unspecified: Secondary | ICD-10-CM | POA: Diagnosis not present

## 2018-02-21 DIAGNOSIS — Z114 Encounter for screening for human immunodeficiency virus [HIV]: Secondary | ICD-10-CM | POA: Diagnosis not present

## 2018-02-21 DIAGNOSIS — E785 Hyperlipidemia, unspecified: Secondary | ICD-10-CM

## 2018-02-21 DIAGNOSIS — Z0000 Encounter for general adult medical examination without abnormal findings: Secondary | ICD-10-CM

## 2018-02-21 DIAGNOSIS — M25551 Pain in right hip: Secondary | ICD-10-CM

## 2018-02-21 LAB — CBC WITH DIFFERENTIAL/PLATELET
BASOS ABS: 0 10*3/uL (ref 0.0–0.1)
Basophils Relative: 1.1 % (ref 0.0–3.0)
EOS PCT: 2 % (ref 0.0–5.0)
Eosinophils Absolute: 0.1 10*3/uL (ref 0.0–0.7)
HCT: 42 % (ref 39.0–52.0)
Hemoglobin: 14.1 g/dL (ref 13.0–17.0)
Lymphocytes Relative: 41.1 % (ref 12.0–46.0)
Lymphs Abs: 1.2 10*3/uL (ref 0.7–4.0)
MCHC: 33.5 g/dL (ref 30.0–36.0)
MCV: 82.8 fl (ref 78.0–100.0)
MONOS PCT: 11.4 % (ref 3.0–12.0)
Monocytes Absolute: 0.3 10*3/uL (ref 0.1–1.0)
NEUTROS ABS: 1.3 10*3/uL — AB (ref 1.4–7.7)
NEUTROS PCT: 44.4 % (ref 43.0–77.0)
Platelets: 211 10*3/uL (ref 150.0–400.0)
RBC: 5.08 Mil/uL (ref 4.22–5.81)
RDW: 13.1 % (ref 11.5–15.5)
WBC: 2.8 10*3/uL — ABNORMAL LOW (ref 4.0–10.5)

## 2018-02-21 LAB — LIPID PANEL
CHOL/HDL RATIO: 4
Cholesterol: 203 mg/dL — ABNORMAL HIGH (ref 0–200)
HDL: 56 mg/dL (ref >39.00)
LDL Cholesterol: 134 mg/dL — ABNORMAL HIGH (ref 0–99)
NonHDL: 147.19
Triglycerides: 64 mg/dL (ref 0.0–149.0)
VLDL: 12.8 mg/dL (ref 0.0–40.0)

## 2018-02-21 LAB — BASIC METABOLIC PANEL
BUN: 19 mg/dL (ref 6–23)
CALCIUM: 9.4 mg/dL (ref 8.4–10.5)
CHLORIDE: 104 meq/L (ref 96–112)
CO2: 29 meq/L (ref 19–32)
Creatinine, Ser: 1.03 mg/dL (ref 0.40–1.50)
GFR: 99.94 mL/min (ref >60.00)
Glucose, Bld: 109 mg/dL — ABNORMAL HIGH (ref 70–99)
Potassium: 4.1 mEq/L (ref 3.5–5.1)
SODIUM: 140 meq/L (ref 135–145)

## 2018-02-21 NOTE — Patient Instructions (Signed)
A few things to remember from today's visit:   Routine general medical examination at a health care facility  Hyperlipidemia, unspecified hyperlipidemia type - Plan: Lipid panel  Diabetes mellitus screening - Plan: Basic metabolic panel  Leukopenia, unspecified type - Plan: CBC with Differential  Encounter for screening for HIV - Plan: HIV antibody   At least 150 minutes of moderate exercise per week, daily brisk walking for 15-30 min is a good exercise option. Healthy diet low in saturated (animal) fats and sweets and consisting of fresh fruits and vegetables, lean meats such as fish and white chicken and whole grains.  - Vaccines:  Tdap vaccine every 10 years.  Shingles vaccine recommended at age 46, could be given after 46 years of age but not sure about insurance coverage.  Pneumonia vaccines:  Prevnar 13 at 65 and Pneumovax at 566.   -Screening recommendations for low/normal risk males:  Screening for diabetes at age 46 and every 3 years. Earlier screening if cardiovascular risk factors.   Lipid screening at 35 and every 3 years. Screening starts in younger males with cardiovascular risk factors.  Colon cancer screening at age 46 and until age 46.  Prostate cancer screening: some controversy, starts usually at 50: Rectal exam and PSA.  Aortic Abdominal Aneurism once between 3665 and 46 years old if ever smoker.  Also recommended:  1. Dental visit- Brush and floss your teeth twice daily; visit your dentist twice a year. 2. Eye doctor- Get an eye exam at least every 2 years. 3. Helmet use- Always wear a helmet when riding a bicycle, motorcycle, rollerblading or skateboarding. 4. Safe sex- If you may be exposed to sexually transmitted infections, use a condom. 5. Seat belts- Seat belts can save your live; always wear one. 6. Smoke/Carbon Monoxide detectors- These detectors need to be installed on the appropriate level of your home. Replace batteries at least once a  year. 7. Skin cancer- When out in the sun please cover up and use sunscreen 15 SPF or higher. 8. Violence- If anyone is threatening or hurting you, please tell your healthcare provider.  9. Drink alcohol in moderation- Limit alcohol intake to one drink or less per day. Never drink and drive. Please be sure medication list is accurate. If a new problem present, please set up appointment sooner than planned today.

## 2018-02-22 LAB — HIV ANTIBODY (ROUTINE TESTING W REFLEX): HIV 1&2 Ab, 4th Generation: NONREACTIVE

## 2018-09-11 IMAGING — DX DG CHEST 2V
2 series · 2 of 2 positions shown · non-contrast
Comparison: None.

CLINICAL DATA: Dry cough for the past month. Right lower chest wall
pain with deep breathing for the past 2 weeks.

EXAM:
CHEST  2 VIEW

[chest pa]
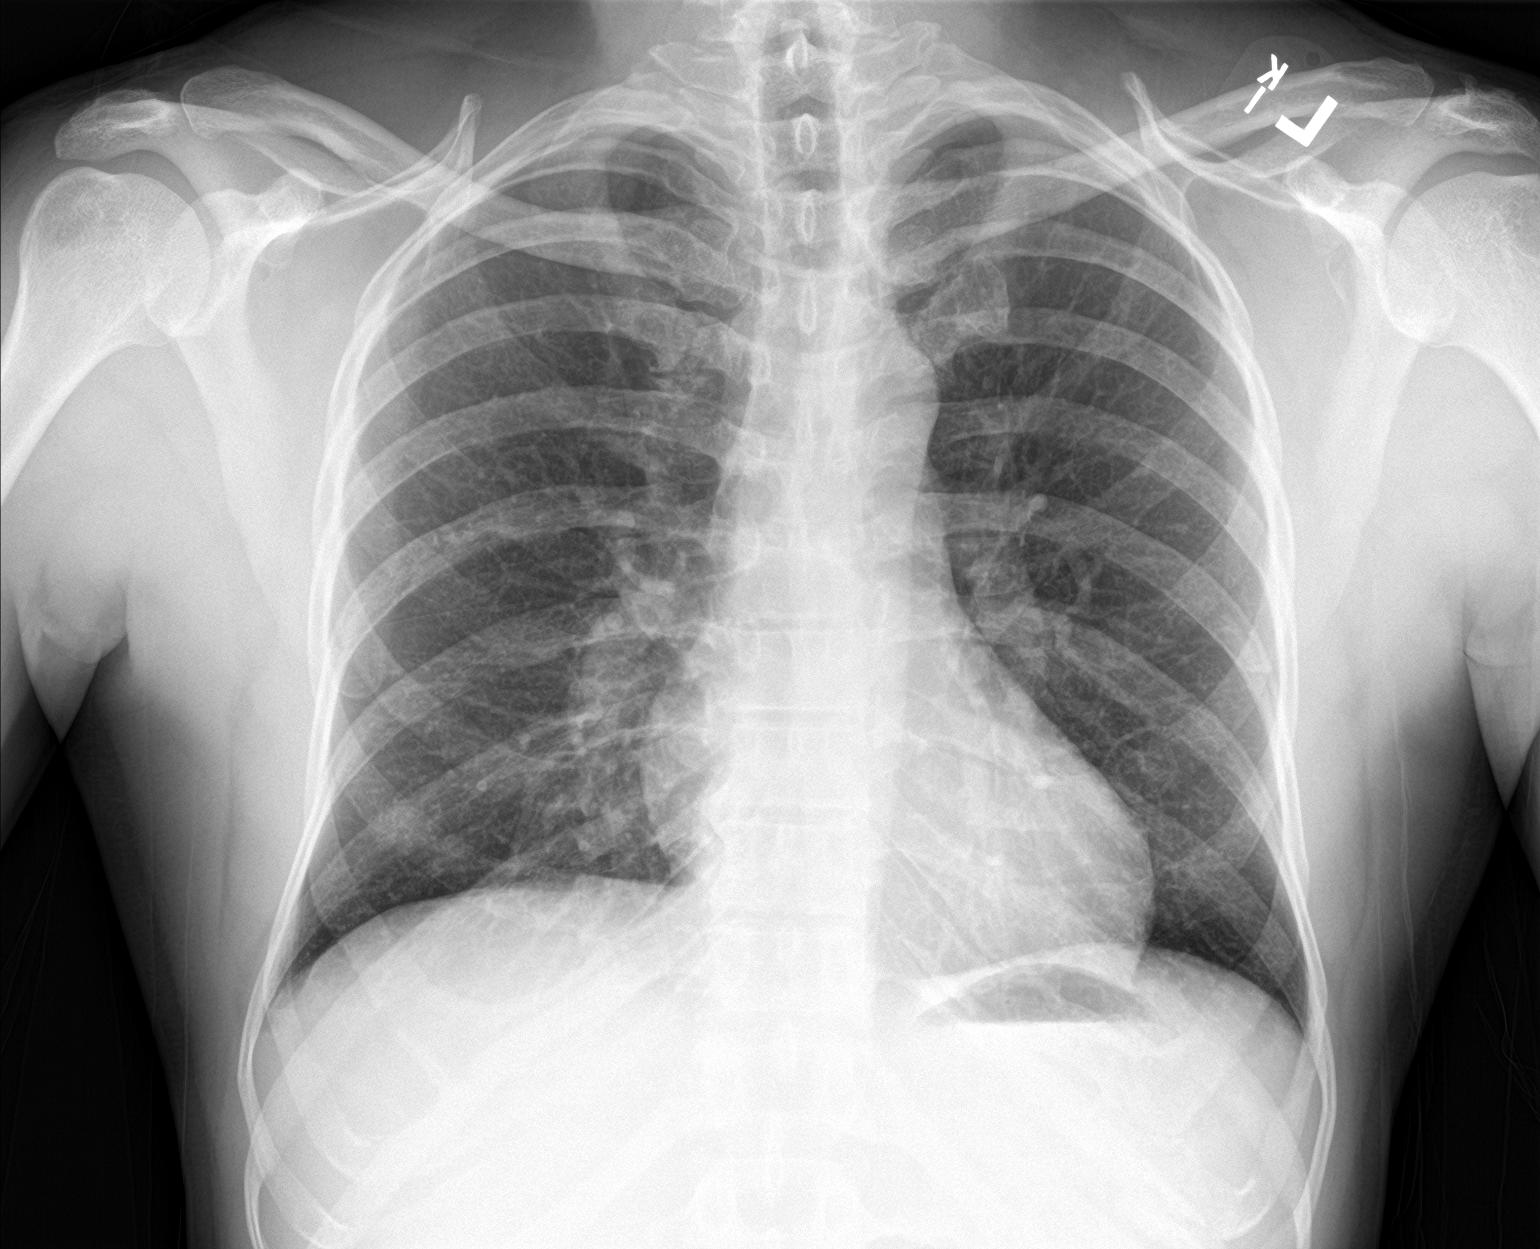

[chest lat]
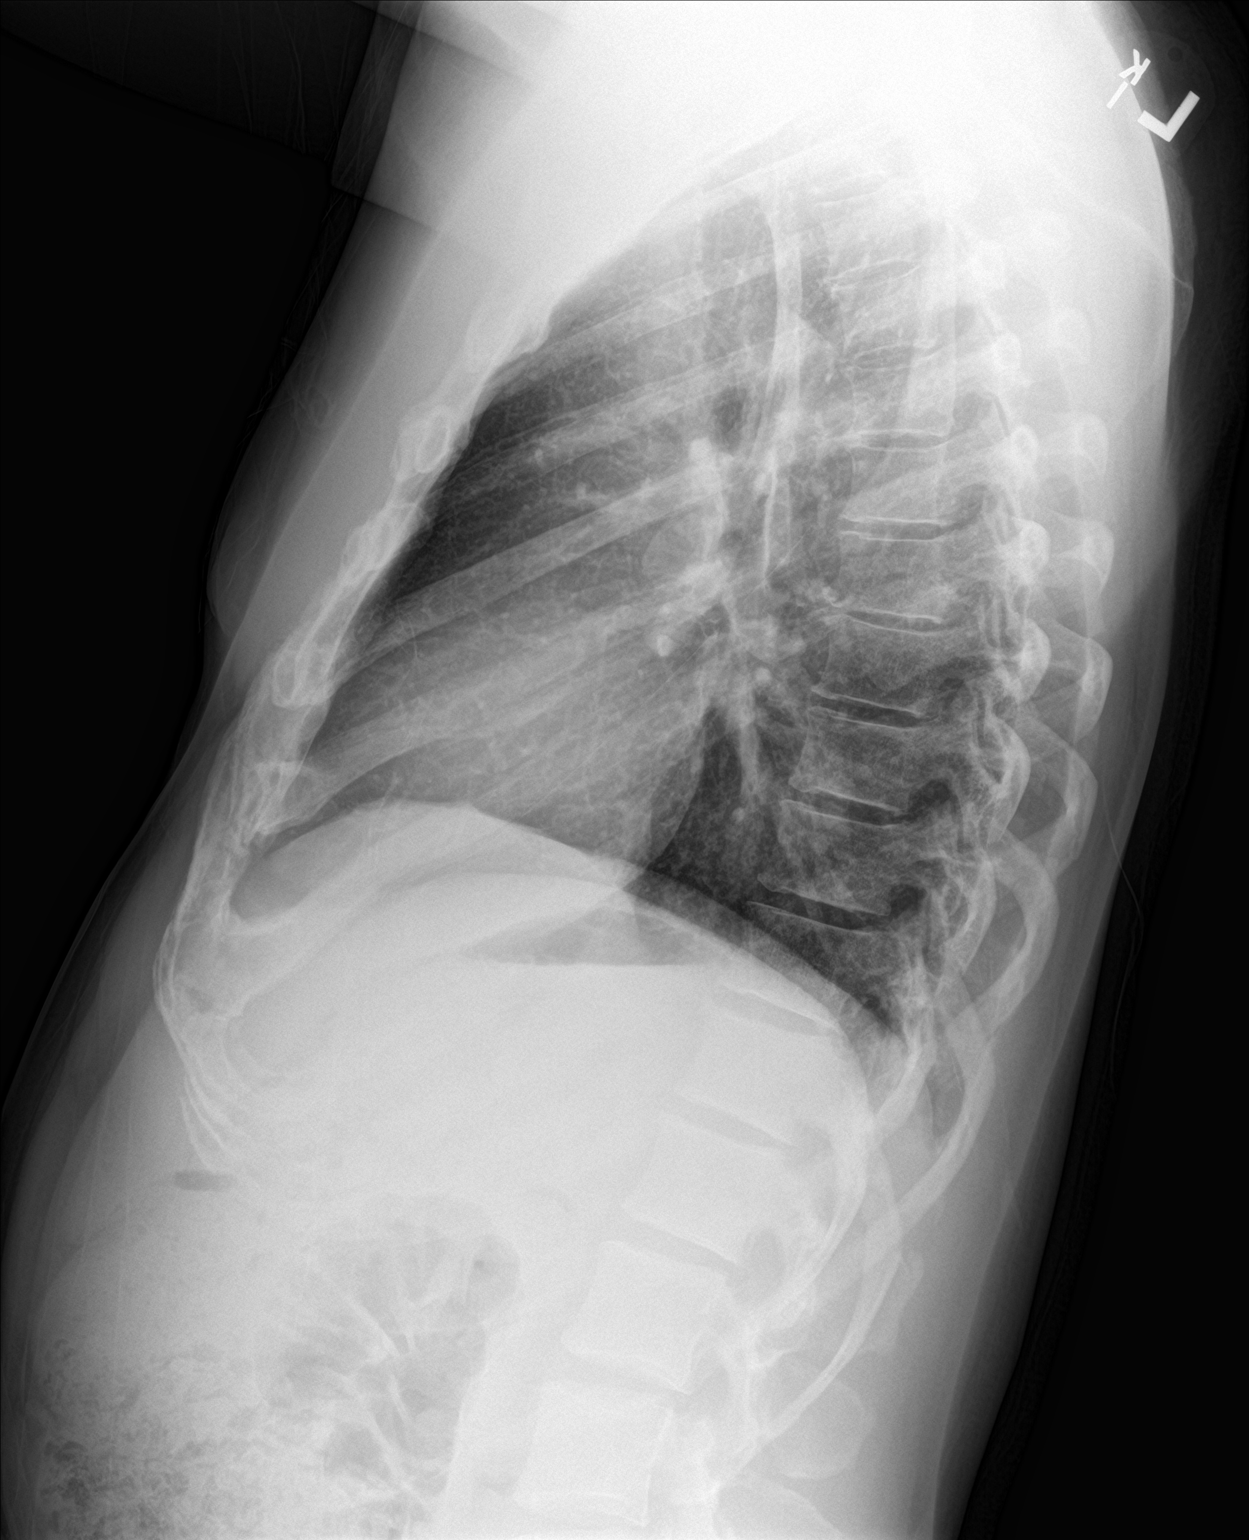

[2 of 2 positions shown; findings below may reference images not displayed]

FINDINGS: Normal cardiac silhouette and mediastinal contours. No focal
airspace opacities. No pleural effusion or pneumothorax. No evidence
of edema. No acute osseous abnormalities.
IMPRESSION: No acute cardiopulmonary disease. Specifically, no evidence of
pneumonia.

## 2019-01-01 ENCOUNTER — Ambulatory Visit
Admit: 2019-01-01 | Discharge: 2019-01-01 | Payer: PRIVATE HEALTH INSURANCE | Attending: Internal Medicine | Primary: Internal Medicine

## 2019-01-01 DIAGNOSIS — Z0000 Encounter for general adult medical examination without abnormal findings: Secondary | ICD-10-CM

## 2019-01-01 NOTE — Patient Instructions (Addendum)
Yearly lipids and glucose check and urinalysis.    Back strengthening exercise program.    Proper back posturing with any lifting.    Aleve 2 twice a day after food if needed.

## 2019-01-01 NOTE — Progress Notes (Signed)
01/01/2019    Trevor Crosby (DOB: 01-Feb-1971) is a 48 y.o. male, here for evaluation of the following medical concerns:    Chief Complaint   Patient presents with   ??? New Patient       Here for getting established and annual physical.  Usually does blood work once a year.  He brought some paperwork from 2018 with shoulder exercises he had done.  Chest x-ray for cough was negative at that time.  But he could not bring any paperwork with colonoscopy or cholesterol check or the sugar check.  He is supposed to go home and check on the paperwork or sign up for my chart and get records and fax it to [place].  As he just relocated from [place].  Under epic chart I explained to him I could not find any colonoscopy I do need to see the colonoscopy records.    Back Pain:  Patient complains of a 7 day(s) history of  lower back pain.  Pain is burning in nature, without radiation.  Pain is persistent, typically severe in intensity, and is exacerbated by twisting.  Associated symptoms: none.  Patient reports the following AHCPR Red Flags: none.  Precipitating factors: no known injury. Patient's history includes recurrent self limited episodes of low back pain in the past.  Previous treatment: heat, NSAID-ibuprofen 400 mg every 6 hours for 2 days did not help.. Diagnostic testing: none.  Symptoms are improving over time with cyclobenzaprine and prednisone.  Pain is 95% better.  I explained to him that this back pain was from improper bending and treating the luggage from the car back and forth when he drove 20 hours.     95% better  Review of Systems   Constitutional: Negative for appetite change, chills, fever and unexpected weight change.   HENT: Negative for congestion, ear discharge, ear pain, nosebleeds, rhinorrhea, sinus pressure, sinus pain, sore throat and trouble swallowing.    Eyes: Negative for pain and discharge.   Respiratory: Negative for cough, chest tightness, shortness of breath and wheezing.    Cardiovascular:  Negative for chest pain, palpitations and leg swelling.   Gastrointestinal: Negative for abdominal pain, blood in stool, nausea and vomiting.   Endocrine: Negative for polydipsia and polyphagia.   Genitourinary: Negative for difficulty urinating, enuresis, flank pain and hematuria.   Musculoskeletal: Positive for back pain. Negative for myalgias.   Skin: Negative for rash.   Neurological: Negative for facial asymmetry, weakness, light-headedness, numbness and headaches.   Psychiatric/Behavioral: Negative for confusion.       Current Outpatient Medications on File Prior to Visit   Medication Sig Dispense Refill   ??? predniSONE (DELTASONE) 20 MG tablet Take 20 mg by mouth daily       No current facility-administered medications on file prior to visit.       No Known Allergies  History reviewed. No pertinent past medical history.  Past Surgical History:   Procedure Laterality Date   ??? COLONOSCOPY  2016 probably    Most likely -No polyps   ??? FACIAL COSMETIC SURGERY      fx facial bone ; fracture with basketball injury      Social History     Tobacco Use   ??? Smoking status: Never Smoker   ??? Smokeless tobacco: Never Used   Substance Use Topics   ??? Alcohol use: Not Currently      Family History   Problem Relation Age of Onset   ??? Colon Cancer Mother 27   ???  High Blood Pressure Father         Vitals:    01/01/19 1507   BP: 122/62   Site: Left Upper Arm   Position: Sitting   Cuff Size: Medium Adult   Pulse: 95   Resp: 16   Temp: 97.7 ??F (36.5 ??C)   SpO2: 98%   Weight: 184 lb (83.5 kg)   Height: 5\' 8"  (1.727 m)     Estimated body mass index is 27.98 kg/m?? as calculated from the following:    Height as of this encounter: 5\' 8"  (1.727 m).    Weight as of this encounter: 184 lb (83.5 kg).    Physical Exam  Vitals signs and nursing note reviewed.   Constitutional:       General: He is not in acute distress.     Appearance: Normal appearance.   HENT:      Head: Normocephalic and atraumatic.      Right Ear: Hearing, tympanic  membrane, ear canal and external ear normal.      Left Ear: Hearing, tympanic membrane, ear canal and external ear normal.      Nose: Nose normal.      Mouth/Throat:      Lips: Pink.      Mouth: Mucous membranes are moist.      Pharynx: Oropharynx is clear. Uvula midline.   Eyes:      General: Lids are normal.      Conjunctiva/sclera: Conjunctivae normal.      Pupils: Pupils are equal, round, and reactive to light.   Neck:      Musculoskeletal: Neck supple.      Thyroid: No thyromegaly.      Vascular: No JVD.      Trachea: No tracheal deviation.   Cardiovascular:      Rate and Rhythm: Normal rate and regular rhythm.      Heart sounds: Normal heart sounds. No gallop.    Pulmonary:      Effort: Pulmonary effort is normal. No respiratory distress.      Breath sounds: Normal breath sounds. No wheezing or rales.   Abdominal:      General: Bowel sounds are normal.      Palpations: Abdomen is soft. There is no mass.      Tenderness: There is no abdominal tenderness.   Genitourinary:     Comments: Not examined as denies any GU complaints  Musculoskeletal:         General: No tenderness.      Comments: Thoracic and lumbar spine back no tender points.    No leg edema or calf tenderness   Lymphadenopathy:      Cervical: No cervical adenopathy.   Skin:     General: Skin is warm and dry.      Findings: No rash.   Neurological:      Mental Status: He is alert and oriented to person, place, and time.      Cranial Nerves: Cranial nerves are intact. No cranial nerve deficit.      Sensory: Sensation is intact. No sensory deficit.      Motor: Motor function is intact.      Coordination: Coordination is intact.      Gait: Gait is intact.      Deep Tendon Reflexes:      Reflex Scores:       Patellar reflexes are 2+ on the right side and 2+ on the left side.     Comments: Big toe  strength is normal.   Psychiatric:         Attention and Perception: Attention and perception normal.         Mood and Affect: Mood and affect normal.          Speech: Speech normal.         Behavior: Behavior normal.         Thought Content: Thought content normal.         Cognition and Memory: Cognition and memory normal.         Judgment: Judgment normal.         ASSESSMENT/PLAN:  1. Annual physical exam  Yearly glucose check and lipid check and urinalysis at this time.  May need back x-rays if the back pain returns.  Back strengthening exercises sheet given and explained at length.    Need colonoscopy records from [place].  As ibuprofen did not help may be Aleve 2 twice a day after food if needed.      Return if symptoms worsen or fail to improve.     Patient Instructions   Yearly lipids and glucose check and urinalysis.    Back strengthening exercise program.    Proper back posturing with any lifting.    Aleve 2 twice a day after food if needed.         Electronically signed by Gillermo Murdoch, MD on 01/01/2019 at 3:47 PM     This dictation was generated by voice recognition computer software. Although all attempts are made to edit the dictation for accuracy, there may be errors in the transcription that are not intended.

## 2019-10-11 ENCOUNTER — Ambulatory Visit
Admit: 2019-10-11 | Discharge: 2019-10-11 | Payer: PRIVATE HEALTH INSURANCE | Attending: Internal Medicine | Primary: Internal Medicine

## 2019-10-11 DIAGNOSIS — Z0000 Encounter for general adult medical examination without abnormal findings: Secondary | ICD-10-CM

## 2019-10-11 NOTE — Patient Instructions (Signed)
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    Need colonoscopy records otherwise he does need to schedule colonoscopy-very very important to prevent colon cancer.    Regular exercise program and lean meats and low-carb diet to lose weight less than 170 pounds.

## 2019-10-11 NOTE — Progress Notes (Signed)
10/11/2019    Trevor Crosby (DOB: 1970-12-02) is a 48 y.o. male, here for evaluation of the following medical concerns:    Chief Complaint   Patient presents with   ??? Annual Exam       Annual physical--extensive chart review done and he does need to lose 10 pounds at least.    Family history colon cancer at age 16 and he has had colonoscopy referral on review of the chart in March 2018 but it is not sure if he is done at that time or not and he is going to try to get those records for [place].  If he has not done it then he is due for colonoscopy at this time.  Then will make a referral for colonoscopy    He is going to bring his annual physical form and then we can order more blood work according to that.  Review of Systems   Constitutional: Negative for appetite change, chills, fever and unexpected weight change.   HENT: Negative for congestion, ear discharge, ear pain, nosebleeds, rhinorrhea, sinus pressure, sinus pain, sore throat and trouble swallowing.    Eyes: Negative for pain and discharge.   Respiratory: Negative for cough, chest tightness, shortness of breath and wheezing.    Cardiovascular: Negative for chest pain, palpitations and leg swelling.   Gastrointestinal: Negative for abdominal pain, blood in stool, nausea and vomiting.   Endocrine: Negative for polydipsia and polyphagia.   Genitourinary: Negative for difficulty urinating, enuresis, flank pain and hematuria.   Musculoskeletal: Negative for myalgias.   Skin: Negative for rash.   Neurological: Negative for facial asymmetry, weakness, light-headedness, numbness and headaches.   Psychiatric/Behavioral: Negative for confusion.       No current outpatient medications on file prior to visit.     No current facility-administered medications on file prior to visit.       No Known Allergies  History reviewed. No pertinent past medical history.  Past Surgical History:   Procedure Laterality Date   ??? COLONOSCOPY  2016 probably    Most likely -No polyps   ???  FACIAL COSMETIC SURGERY      fx facial bone ; fracture with basketball injury      Social History     Tobacco Use   ??? Smoking status: Never Smoker   ??? Smokeless tobacco: Never Used   Substance Use Topics   ??? Alcohol use: Not Currently      Family History   Problem Relation Age of Onset   ??? Colon Cancer Mother 34   ??? High Blood Pressure Father         Vitals:    10/11/19 0949   BP: 124/78   Pulse: 68   Resp: 16   Temp: 97.3 ??F (36.3 ??C)   SpO2: 97%   Weight: 182 lb (82.6 kg)     Estimated body mass index is 27.67 kg/m?? as calculated from the following:    Height as of 01/01/19: 5\' 8"  (1.727 m).    Weight as of this encounter: 182 lb (82.6 kg).    Physical Exam  Vitals signs and nursing note reviewed.   Constitutional:       General: He is not in acute distress.  HENT:      Head: Normocephalic and atraumatic.      Right Ear: Tympanic membrane, ear canal and external ear normal.      Left Ear: Tympanic membrane, ear canal and external ear normal.   Eyes:  General: Lids are normal.      Conjunctiva/sclera: Conjunctivae normal.      Pupils: Pupils are equal, round, and reactive to light.   Neck:      Musculoskeletal: Neck supple.      Thyroid: No thyromegaly.      Vascular: No JVD.      Trachea: No tracheal deviation.   Cardiovascular:      Rate and Rhythm: Normal rate and regular rhythm.      Heart sounds: Normal heart sounds. No gallop.    Pulmonary:      Effort: Pulmonary effort is normal. No respiratory distress.      Breath sounds: Normal breath sounds. No wheezing or rales.   Abdominal:      General: Bowel sounds are normal.      Palpations: Abdomen is soft. There is no mass.      Tenderness: There is no abdominal tenderness.   Musculoskeletal:         General: No tenderness.      Comments: No leg edema or calf tenderness   Lymphadenopathy:      Cervical: No cervical adenopathy.   Skin:     General: Skin is warm and dry.      Findings: No rash.   Neurological:      General: No focal deficit present.      Mental  Status: He is alert and oriented to person, place, and time.      Cranial Nerves: No cranial nerve deficit.      Sensory: No sensory deficit.   Psychiatric:         Mood and Affect: Mood normal.         Behavior: Behavior normal.         Thought Content: Thought content normal.         Judgment: Judgment normal.         ASSESSMENT/PLAN:  1. Annual physical exam    - Lipid Panel; Future  - Comprehensive Metabolic Panel; Future      Return in about 1 year (around 10/13/2020) for Annual physical.     Patient Instructions   Fasting blood work tomorrow at [facility] at [place].    Need colonoscopy records otherwise he does need to schedule colonoscopy-very very important to prevent colon cancer.    Regular exercise program and lean meats and low-carb diet to lose weight less than 170 pounds.       Electronically signed by Gillermo Murdoch, MD on 10/11/2019 at 10:19 AM     This dictation was generated by voice recognition computer software. Although all attempts are made to edit the dictation for accuracy, there may be errors in the transcription that are not intended.

## 2019-10-17 ENCOUNTER — Encounter

## 2019-10-18 ENCOUNTER — Encounter

## 2019-10-18 LAB — LIPID PANEL
Cholesterol, Total: 206 mg/dL — ABNORMAL HIGH (ref 0–199)
HDL: 62 mg/dL — ABNORMAL HIGH (ref 40–60)
LDL Calculated: 133 mg/dL — ABNORMAL HIGH (ref <100)
Triglycerides: 55 mg/dL (ref 0–150)
VLDL Cholesterol Calculated: 11 mg/dL

## 2019-10-18 LAB — COMPREHENSIVE METABOLIC PANEL
ALT: 25 U/L (ref 10–40)
AST: 23 U/L (ref 15–37)
Albumin/Globulin Ratio: 1.5 (ref 1.1–2.2)
Albumin: 4.4 g/dL (ref 3.4–5.0)
Alkaline Phosphatase: 71 U/L (ref 40–129)
Anion Gap: 9 (ref 3–16)
BUN: 16 mg/dL (ref 7–20)
CO2: 27 mmol/L (ref 21–32)
Calcium: 9.1 mg/dL (ref 8.3–10.6)
Chloride: 104 mmol/L (ref 99–110)
Creatinine: 0.8 mg/dL — ABNORMAL LOW (ref 0.9–1.3)
GFR African American: >60 (ref >60)
GFR Non-African American: >60 (ref >60)
Globulin: 2.9 g/dL
Glucose: 100 mg/dL — ABNORMAL HIGH (ref 70–99)
Potassium: 4.8 mmol/L (ref 3.5–5.1)
Sodium: 140 mmol/L (ref 136–145)
Total Bilirubin: 0.4 mg/dL (ref 0.0–1.0)
Total Protein: 7.3 g/dL (ref 6.4–8.2)

## 2019-12-07 NOTE — Telephone Encounter (Signed)
I received a call from [facility] stated that he needed a new referral for a new Gastroenterologist  because they are not excepting any new patients at this time. Please call patient when he is able to pick up the new referral.

## 2021-08-20 ENCOUNTER — Telehealth
Admit: 2021-08-20 | Discharge: 2021-08-20 | Payer: PRIVATE HEALTH INSURANCE | Attending: Student in an Organized Health Care Education/Training Program | Primary: Internal Medicine

## 2021-08-20 ENCOUNTER — Inpatient Hospital Stay: Admit: 2021-08-20 | Payer: PRIVATE HEALTH INSURANCE | Primary: Internal Medicine

## 2021-08-20 DIAGNOSIS — R059 Cough, unspecified: Secondary | ICD-10-CM

## 2021-08-20 MED ORDER — BENZONATATE 200 MG PO CAPS
200 MG | ORAL_CAPSULE | Freq: Three times a day (TID) | ORAL | 0 refills | Status: AC | PRN
Start: 2021-08-20 — End: 2021-08-27

## 2021-08-20 NOTE — Telephone Encounter (Signed)
Received call from Karle Starch at [facility] with Red Flag Complaint.    Subjective: Caller states "He has had a cough since he had Covid a few months back and he is feeling worse."     Current Symptoms: cough    Onset: 1 week ago; worsening    Associated Symptoms: Pain under ribcage when coughing    Pain Severity: 5/10; sharp; constant    Temperature: Denies    What has been tried: negative Covid-19 test, Delysm    Recommended disposition: See in Office Today    Care advice provided, patient verbalizes understanding; denies any other questions or concerns; instructed to call back for any new or worsening symptoms.    Patient/Caller agrees with recommended disposition; Probation officer provided warm transfer to [employer] at [facility] for appointment scheduling     Attention Provider:  Thank you for allowing me to participate in the care of your patient.  The patient was connected to triage in response to information provided to the ECC/PSC.  Please do not respond through this encounter as the response is not directed to a shared pool.    Reason for Disposition   SEVERE coughing spells (e.g., whooping sound after coughing, vomiting after coughing)    Protocols used: Cough-ADULT-[place]

## 2021-08-20 NOTE — Progress Notes (Signed)
Ranson Mcmaster (DOB:  [birth year]) is a Established patient, here for evaluation of the following:    Assessment & Plan   Below is the assessment and plan developed based on review of pertinent history, physical exam, labs, studies, and medications.  1. Cough  -     XR CHEST (2 VW); Future  2. Chest pain, pleuritic  Return if symptoms worsen or fail to improve.       Subjective   HPI  The patient presented today through virtual visit with a complaint of cough and chest discomfort.  Reported about a week ago he did have some URI.  He did have 2 negative home test for COVID, one was taken on Monday, the other was taken on Wednesday.  He said report that he i 2 COVID-vaccine shots but is due for booster.  Reports that most of his symptom of URI improved except cough.  However about 2 days ago he started having a bit bouts of cough and have sharp pain on his left side with cough.  His cough seem to be improved, patient said now it is somewhat productive occasionally he will cough up brownish sputum.   Denies any fever, chills, shortness of breath, diarrhea, change in taste or smell.  No sick contact    Review of Systems   Constitutional:  Negative for activity change, appetite change, chills, diaphoresis, fatigue, fever and unexpected weight change.   HENT:  Negative for trouble swallowing and voice change.    Eyes:  Negative for photophobia and visual disturbance.   Respiratory:  Positive for cough and chest tightness. Negative for apnea, choking, shortness of breath, wheezing and stridor.    Cardiovascular:  Negative for chest pain, palpitations and leg swelling.   Gastrointestinal:  Negative for abdominal distention, abdominal pain, constipation, diarrhea, nausea and vomiting.   Genitourinary:  Negative for difficulty urinating and dysuria.   Skin:  Negative for rash and wound.   Neurological:  Negative for dizziness, weakness and light-headedness.   Psychiatric/Behavioral:  Negative for agitation and behavioral  problems.         Objective   Patient-Reported Vitals  Patient-Reported Temperature: 98.6  Patient-Reported Weight: 168  Patient-Reported Height: 5'9       Physical Exam  [INSTRUCTIONS:  "[x] " Indicates a positive item  "[] " Indicates a negative item  -- DELETE ALL ITEMS NOT EXAMINED]    Constitutional: [x]  Appears well-developed and well-nourished [x]  No apparent distress      []  Abnormal -     Mental status: [x]  Alert and awake  [x]  Oriented to person/place/time [x]  Able to follow commands    []  Abnormal -     Eyes:   EOM    [x]   Normal    []  Abnormal -   Sclera  [x]   Normal    []  Abnormal -          Discharge [x]   None visible   []  Abnormal -     HENT: [x]  Normocephalic, atraumatic  []  Abnormal -   [x]  Mouth/Throat: Mucous membranes are moist    External Ears [x]  Normal  []  Abnormal -    Neck: [x]  No visualized mass []  Abnormal -     Pulmonary/Chest: [x]  Respiratory effort normal   [x]  No visualized signs of difficulty breathing or respiratory distress        []  Abnormal -      Musculoskeletal:   [x]  Normal gait with no signs of ataxia         [  x] Normal range of motion of neck        []  Abnormal -     Neurological:        [x]  No Facial Asymmetry (Cranial nerve 7 motor function) (limited exam due to video visit)          [x]  No gaze palsy        []  Abnormal -          Skin:        [x]  No significant exanthematous lesions or discoloration noted on facial skin         []  Abnormal -            Psychiatric:       [x]  Normal Affect []  Abnormal -        [x]  No Hallucinations    Other pertinent observable physical exam findings:-         On this date 08/20/2021 I have spent 20 minutes reviewing previous notes, test results and face to face (virtual) with the patient discussing the diagnosis and importance of compliance with the treatment plan as well as documenting on the day of the visit.    [place] Patient, was evaluated through a synchronous (real-time) audio-video encounter. The patient (or guardian if applicable) is  aware that this is a billable service, which includes applicable co-pays. This Virtual Visit was conducted with patient's (and/or legal guardian's) consent. The visit was conducted pursuant to the emergency declaration under the [place], [place] waiver authority and the [employer] Act.  Patient identification was verified, and a caregiver was present when appropriate.   The patient was located at Home: [address]  [place] [address].   Provider was located at [place] ([place] Dept): [address], [place] [address].        --Jhonnie Garner, MD

## 2021-08-20 NOTE — Assessment & Plan Note (Signed)
Will obtain x-ray to rule out pneumothorax

## 2021-08-20 NOTE — Assessment & Plan Note (Addendum)
Post viral reactive bronchitis, improving  Tessalon Perle

## 2021-08-25 ENCOUNTER — Ambulatory Visit
Admit: 2021-08-25 | Discharge: 2021-08-25 | Payer: PRIVATE HEALTH INSURANCE | Attending: Internal Medicine | Primary: Internal Medicine

## 2021-08-25 DIAGNOSIS — Z0000 Encounter for general adult medical examination without abnormal findings: Secondary | ICD-10-CM

## 2021-08-25 NOTE — Progress Notes (Signed)
Well Adult Note  Name: Trevor Crosby Today???s Date: 08/25/2021   MRN: [id number] Sex: Male   Age: 50 y.o. Ethnicity: Non-Hispanic / Non Latino   DOB: 07-01-1971 Race: Black / African American      [facility] is here for well adult exam.  History:  Wellness exam - Patient presents today for annual physical. Patient  reports feeling well. Patient has a normal appetite. Patient  eats 5+ servings of vegetables/fruits each day. Patient prepares food at home multiple times/day, eats in restaurants rarely. Patient  states that he sleeps 5-6 hours of  on average. In regards to emotional health, patient  denies depression or anxiety. Libido is considered to be normal. In regards to bowel habits, patient  has no complaints. Regarding urination, no complaints. Patient reports they feels safe at home, uses seat belts and has smoke detectors. Patient has a good work-life balance, and is happy with his job.    Health Maintenance    Annual retinal eye exam - Patient due  will need to schedule   Annual Dentist visit - Patient due  will need to schedule   Tobacco smoking  -  NO  Alcohol Misuse - NO  Illicit Drug Use- NO  Healthy Diet and physical activity -  YES  Obesity Screen- screened  08/25/2021  Wears seat belt- YES  End of life directives discussed with patient.-Mentions he does not have  a will/or/an advanced directives. Was instructed to start process looking into preparing his will an advanced directives.       Review of Systems   Constitutional:  Negative for activity change, appetite change, chills, fatigue, fever and unexpected weight change.   HENT:  Negative for congestion, ear pain, postnasal drip, sinus pressure, sore throat, tinnitus and trouble swallowing.    Eyes:  Negative for pain and redness.   Respiratory:  Negative for cough, chest tightness, shortness of breath and wheezing.    Cardiovascular:  Negative for chest pain, palpitations and leg swelling.   Gastrointestinal:  Negative for abdominal distention,  abdominal pain, blood in stool, constipation, diarrhea, nausea and vomiting.   Endocrine: Negative for cold intolerance, heat intolerance and polydipsia.   Genitourinary:  Negative for decreased urine volume, dysuria, flank pain, frequency, hematuria and urgency.   Musculoskeletal:  Negative for arthralgias, back pain, joint swelling, neck pain and neck stiffness.   Skin:  Negative for color change and rash.   Neurological:  Negative for dizziness, weakness, numbness and headaches.   Hematological:  Negative for adenopathy.   Psychiatric/Behavioral:  Negative for behavioral problems, sleep disturbance and suicidal ideas. The patient is not nervous/anxious.      No Known Allergies      Prior to Visit Medications    Medication Sig Taking? Authorizing Provider   benzonatate (TESSALON) 200 MG capsule Take 1 capsule by mouth 3 times daily as needed for Cough  Jhonnie Garner, MD       No past medical history on file.    Past Surgical History:   Procedure Laterality Date    COLONOSCOPY  2016 probably    Most likely -No polyps    FACIAL COSMETIC SURGERY      fx facial bone ; fracture with basketball injury         Family History   Problem Relation Age of Onset    Colon Cancer Mother 35    High Blood Pressure Father        Social History     Tobacco Use  Smoking status: Never    Smokeless tobacco: Never   Vaping Use    Vaping Use: Never used   Substance Use Topics    Alcohol use: Not Currently    Drug use: Never       Objective   BP 132/78 (Site: Left Upper Arm, Position: Sitting, Cuff Size: Large Adult)    Pulse 71    Resp 14    Ht 5' 9.5" (1.765 m)    Wt 172 lb 12.8 oz (78.4 kg)    SpO2 98%    BMI 25.15 kg/m??   Wt Readings from Last 3 Encounters:   08/25/21 172 lb 12.8 oz (78.4 kg)   10/11/19 182 lb (82.6 kg)   01/01/19 184 lb (83.5 kg)     There were no vitals filed for this visit.      Physical Exam  Constitutional:       General: He is not in acute distress.     Appearance: Normal appearance. He is not ill-appearing.    HENT:      Head: Normocephalic and atraumatic.      Right Ear: Tympanic membrane, ear canal and external ear normal. There is no impacted cerumen.      Left Ear: Tympanic membrane, ear canal and external ear normal. There is no impacted cerumen.      Mouth/Throat:      Mouth: Mucous membranes are moist.      Pharynx: No oropharyngeal exudate or posterior oropharyngeal erythema.   Eyes:      Extraocular Movements: Extraocular movements intact.      Conjunctiva/sclera: Conjunctivae normal.      Pupils: Pupils are equal, round, and reactive to light.   Neck:      Vascular: No carotid bruit.   Cardiovascular:      Rate and Rhythm: Normal rate and regular rhythm.      Pulses: Normal pulses.      Heart sounds: Normal heart sounds. No murmur heard.    No gallop.   Pulmonary:      Effort: Pulmonary effort is normal.      Breath sounds: Normal breath sounds. No wheezing, rhonchi or rales.   Abdominal:      General: Abdomen is flat. Bowel sounds are normal. There is no distension.      Palpations: Abdomen is soft.      Tenderness: There is no abdominal tenderness. There is no guarding or rebound.   Musculoskeletal:         General: No swelling or tenderness. Normal range of motion.      Cervical back: No tenderness.   Lymphadenopathy:      Cervical: No cervical adenopathy.   Skin:     Findings: No erythema, lesion or rash.   Neurological:      General: No focal deficit present.      Mental Status: He is alert and oriented to person, place, and time. Mental status is at baseline.      Cranial Nerves: No cranial nerve deficit.      Motor: No weakness.   Psychiatric:         Mood and Affect: Mood normal.         Behavior: Behavior normal.         Thought Content: Thought content normal.         Judgment: Judgment normal.         Assessment   Plan   1. Encounter for well adult exam without abnormal  findings  Assessment & Plan:   Patient comes in for wellness exam   we discussed age appropriate USPSTF screens  Over 75% of the  visit was spent counselling patient on appropriate lifestyle choices.     Orders:  -     Tdap (age 78y and older) IM (Boostrix)  -     Lipid Panel; Future  -     Hepatitis C Antibody; Future  -     HIV Screen; Future  -     PSA Screening; Future  -     CBC with Auto Differential; Future  -     Comprehensive Metabolic Panel; Future  -     Hemoglobin A1C; Future  -     Vitamin D 25 Hydroxy; Future  2. Family history of prostate cancer in father  -     PSA Screening; Future  3. Need for prophylactic vaccination with tetanus-diphtheria (Td)  -     Tdap (age 13y and older) IM (Boostrix)  4. Screening for hyperlipidemia  -     Lipid Panel; Future  5. Other problems related to lifestyle  -     Hepatitis C Antibody; Future  6. Screening for HIV without presence of risk factors  -     HIV Screen; Future  7. Need for hepatitis C screening test  -     Hepatitis C Antibody; Future  8. OSA (obstructive sleep apnea)  -     Baseline Diagnostic Sleep Study; Future  9. Vitamin D deficiency  -     Vitamin D 25 Hydroxy; Future  10. Impaired glucose regulation  -     Hemoglobin A1C; Future     Well adult exam  -  Anticipatory Guidance  Injury Prevention  Lap-shoulder belts, Smoke detectors, Carbon monoxide detectors,  removal if appropriate and  Occupational risk counseling  Substance Abuse  - Tobacco/alcohol/drug cessation or never starting any of those. Include pharmacotherapy, social support for cessation if applicable to patient, and skills training/problem solving. Availability of treatment for abuse.    Sexual Behavior  - STD prevention; abstinence; avoid high-risk behavior; condoms/male barrier with spermicide,  Contraception   Diet and Exercise   - Limit fat and cholesterol; maintain caloric balance; emphasized grains, fruits and vegetables. Moderation in sodium/caffeine intake, saturated fat and cholesterol, caloric balance, sufficient intake of fresh fruits, vegetables, fiber, calcium. Regular physical activity at least 150  minutes per week to maintain activity. Stressed the importance of regular exercise.    Protection from UV Light: Discussed using Hats and sun block when exposed to direct sunlight.  Abuse and Violence: violence prevention at home, school and in social situations  Dental Health: Discussed importance of regular tooth brushing, flossing, and dental visits.  Immunizations reviewed : Discussed with patient    Personalized Preventive Plan   Current Health Maintenance Status  Immunization History   Administered Date(s) Administered    Tdap (Boostrix, Adacel) 03/06/2010        Health Maintenance   Topic Date Due    COVID-19 Vaccine (1) Never done    HIV screen  Never done    Hepatitis C screen  Never done    Colorectal Cancer Screen  Never done    DTaP/Tdap/Td vaccine (2 - Td or Tdap) 03/06/2020    Flu vaccine (1) Never done    Depression Screen  08/20/2022    Lipids  10/16/2024    Hepatitis A vaccine  Aged Out    Hepatitis B vaccine  Aged Out    Hib vaccine  Aged Out    Meningococcal (ACWY) vaccine  Aged Out    Pneumococcal 0-64 years Vaccine  Aged Out     Recommendations for [employer] Due: see orders and patient instructions/AVS.    Return in about 1 year (around 08/25/2022).        Advance Care Planning   Advanced Care Planning: Discussed the patient???s choices for care and treatment in case of a health event that adversely affects decision-making abilities. Also discussed the patient???s long-term treatment options. Reviewed with the patient the appropriate state-specific advance directive documents. Reviewed the process of designating a competent adult as an Agent (or Attorney-in-fact) that could take make health care decisions for the patient if incompetent. Patient was asked to complete the declaration forms, either acknowledge the forms by a public notary or an eligible witness and provide a signed copy to the practice office.  Time spent (minutes): 5 mins

## 2021-08-25 NOTE — Patient Instructions (Signed)
Advance Directives: Care Instructions  Overview  An advance directive is a legal way to state your wishes at the end of your life. It tells your family and your doctor what to do if you can't say what you want.  There are two main types of advance directives. You can change them any time your wishes change.  Living will.  This form tells your family and your doctor your wishes about life support and other treatment. The form is also called a declaration.  Medical power of attorney.  This form lets you name a person to make treatment decisions for you when you can't speak for yourself. This person is called a health care agent (health care proxy, health care surrogate). The form is also called a durable power of attorney for health care.  If you do not have an advance directive, decisions about your medical care may be made by a family member, or by a doctor or a judge who doesn't know you.  It may help to think of an advance directive as a gift to the people who care for you. If you have one, they won't have to make tough decisions by themselves.  Follow-up care is a key part of your treatment and safety. Be sure to make and go to all appointments, and call your doctor if you are having problems. It's also a good idea to know your test results and keep a list of the medicines you take.  What should you include in an advance directive?  Many states have a unique advance directive form. (It may ask you to address specific issues.) Or you might use a universal form that's approved by many states.  If your form doesn't tell you what to address, it may be hard to know what to include in your advance directive. Use the questions below to help you get started.  Who do you want to make decisions about your medical care if you are not able to?  What life-support measures do you want if you have a serious illness that gets worse over time or can't be cured?  What are you most afraid of that might happen? (Maybe you're  afraid of having pain, losing your independence, or being kept alive by machines.)  Where would you prefer to die? (Your home? A hospital? A nursing home?)  Do you want to donate your organs when you die?  Do you want certain religious practices performed before you die?  When should you call for help?  Be sure to contact your doctor if you have any questions.  Where can you learn more?  Go to [email or web address] and sign in to your MyChart account. Enter R264 in the [place] box to learn more about "Advance Directives: Care Instructions."     If you do not have an account, please click on the "Sign Up Now" link.  Current as of: May 14, 2021??????????????????????????????Content Version: 13.4  ?? 2006-2022 Healthwise, Incorporated.   Care instructions adapted under license by [facility]. If you have questions about a medical condition or this instruction, always ask your healthcare professional. [place] any warranty or liability for your use of this information.           Learning About Medical Power of Attorney  What is a medical power of attorney?     A medical power of attorney, also called a durable power of attorney for health care, is one type of the legal forms called advance  directives. It lets you name the person you want to make treatment decisions for you if you can't speak or decide for yourself. The person you choose is called your health care agent. This person is also called a health care proxy or health care surrogate.  A medical power of attorney may be called something else in your state.  How do you choose a health care agent?  Choose your health care agent carefully. This person may or may not be a family member.  Talk to the person before you make your final decision. Make sure he or she is comfortable with this responsibility.  It's a good idea to choose someone who:  Is at least 49 years old.  Knows you well and understands what makes life  meaningful for you.  Understands your religious and moral values.  Will do what you want, not what he or she wants.  Will be able to make difficult choices at a stressful time.  Will be able to refuse or stop treatment, if that is what you would want, even if you could die.  Will be firm and confident with health professionals if needed.  Will ask questions to get needed information.  Lives near you or agrees to travel to you if needed.  Your family may help you make medical decisions while you can still be part of that process. But it's important to choose one person to be your health care agent in case you aren't able to make decisions for yourself.  If you don't fill out the legal form and name a health care agent, the decisions your family can make may be limited.  A health care agent may be called something else in your state.  Who will make decisions for you if you don't have a health care agent?  If you don't have a health care agent or a living will, you may not get the care you want. Decisions may be made by family members who disagree about your medical care. Or decisions may be made by a medical professional who doesn't know you well. In some cases, a judge makes the decisions.  When you name a health care agent, it is very clear who has the power to make health decisions for you.  How do you name a health care agent?  You name your health care agent on a legal form. This form is usually called a medical power of attorney. Ask your hospital, state bar association, or office on aging where to find these forms.  You must sign the form to make it legal. Some states require you to get the form notarized. This means that a person called a notary public watches you sign the form and then he or she signs the form. Some states also require that two or more witnesses sign the form.  Be sure to tell your family members and doctors who your health care agent is.  Where can you learn more?  Go to  [email or web address] and sign in to your MyChart account. Enter P737 in the [place] box to learn more about "[place]."     If you do not have an account, please click on the "Sign Up Now" link.  Current as of: September 03, 2020??????????????????????????????Content Version: 13.4  ?? 2006-2022 Healthwise, Incorporated.   Care instructions adapted under license by [facility]. If you have questions about a medical condition or this instruction, always ask your healthcare professional. [place],  Incorporated disclaims any warranty or liability for your use of this information.           Learning About Living Eugenie Birks  What is a living will?     A living will, also called a declaration, is a legal form. It tells your family and your doctor your wishes when you can't speak for yourself. It's used by the health professionals who will treat you as you near the end of your life or if you get seriously hurt or ill.  If you put your wishes in writing, your loved ones and others will know what kind of care you want. They won't need to guess. This can ease your mind and be helpful to others. And you can change or cancel your living will at any time.  A living will is not the same as an estate or property will. An estate will explains what you want to happen with your money and property after you die.  How do you use it?  Keep these facts in mind about how a living will is used.  Your living will is used only if you can't speak or make decisions for yourself. Most often, one or more doctors must certify that you can't speak or decide for yourself before your living will takes effect.  If you get better and can speak for yourself again, you can accept or refuse any treatment. It doesn't matter what you said in your living will.  Some states may limit your right to refuse treatment in certain cases. For example, you may need to clearly state in your living will that you don't want  artificial hydration and nutrition, such as being fed through a tube.  Is a living will a legal document?  A living will is a legal document. Each state has its own laws about living wills. And a living will may be called something else in your state.  Here are some things to know about living wills.  You don't need an attorney to complete a living will. But legal advice can be helpful if your state's laws are unclear. It can also help if your health history is complicated or your family can't agree on what should be in your living will.  You can change your living will at any time. Some people find that their wishes about end-of-life care change as their health changes. If you make big changes to your living will, complete a new form.  If you move to another state, make sure that your living will is legal in the state where you now live. In most cases, doctors will respect your wishes even if you have a form from a different state.  You might use a universal form that has been approved by many states. This kind of form can sometimes be filled out and stored online. Your digital copy will then be available wherever you have a connection to the internet. The doctors and nurses who need to treat you can find it right away.  Your state may offer an online registry. This is another place where you can store your living will online.  It's a good idea to get your living will notarized. This means using a person called a notary public to watch two people sign, or witness, your living will.  What should you know when you create a living will?  Here are some questions to ask yourself as you make your living will.  Do you know enough about life  support methods that might be used? If not, talk to your doctor so you know what might be done if you can't breathe on your own, your heart stops, or you can't swallow.  What things would you still want to be able to do after you receive life-support methods? Would you want to be able  to walk? To speak? To eat on your own? To live without the help of machines?  Do you want certain religious practices performed if you become very ill?  If you have a choice, where do you want to be cared for? In your home? At a hospital or nursing home?  If you have a choice at the end of your life, where would you prefer to die? At home? In a hospital or nursing home? Somewhere else?  Would you prefer to be buried or cremated?  Do you want your organs to be donated after you die?  What should you do with your living will?  Make sure that your family members and your health care agent have copies of your living will (also called a declaration).  Give your doctor a copy of your living will. Ask to have it kept as part of your medical record. If you have more than one doctor, make sure that each one has a copy.  Put a copy of your living will where it can be easily found. For example, some people may put a copy on their refrigerator door. If you are using a digital copy, be sure your doctor, family members, and health care agent know how to find and access it.  Where can you learn more?  Go to [email or web address] and sign in to your MyChart account. Enter [phone] in the [place] box to learn more about "[place]."     If you do not have an account, please click on the "Sign Up Now" link.  Current as of: May 14, 2021??????????????????????????????Content Version: 13.4  ?? 2006-2022 Healthwise, Incorporated.   Care instructions adapted under license by [facility]. If you have questions about a medical condition or this instruction, always ask your healthcare professional. [place] any warranty or liability for your use of this information.           Well Visit, Ages 75 to 73: Care Instructions  Well visits can help you stay healthy. Your doctor has checked your overall health and may have suggested ways to take good care of yourself. Your doctor also may  have recommended tests. You can help prevent illness with healthy eating, good sleep, vaccinations, regular exercise, and other steps.  Get the tests that you and your doctor decide on. Depending on your age and risks, examples might include screening for diabetes; hepatitis C; HIV; and cervical, breast, lung, and colon cancer. Screening helps find diseases before any symptoms appear.  Eat healthy foods. Choose fruits, vegetables, whole grains, lean protein, and low-fat dairy foods. Limit saturated fat and reduce salt.  Limit alcohol. Men should have no more than 2 drinks a day. Women should have no more than 1. For some people, no alcohol is the best choice.  Exercise. Get at least 30 minutes of exercise on most days of the week. Walking can be a good choice.  Reach and stay at your healthy weight. This will lower your risk for many health problems.  Take care of your mental health. Try to stay connected with friends, family, and community, and find ways to manage stress.  If you're feeling depressed or hopeless, talk to someone. A counselor can help. If you don't have a counselor, talk to your doctor.  Talk to your doctor if you think you may have a problem with alcohol or drug use. This includes prescription medicines and illegal drugs.  Avoid tobacco and nicotine: Don't smoke, vape, or chew. If you need help quitting, talk to your doctor.  Practice safer sex. Getting tested, using condoms or dental dams, and limiting sex partners can help prevent STIs.  Use birth control if it's important to you to prevent pregnancy. Talk with your doctor about your choices and what might be best for you.  Prevent problems where you can. Protect your skin from too much sun, wash your hands, brush your teeth twice a day, and wear a seat belt in the car.  Where can you learn more?  Go to [email or web address] and sign in to your MyChart account. Enter P072 in the [place] box to learn more about  "Well Visit, Ages 18 to 71: Care Instructions."     If you do not have an account, please click on the "Sign Up Now" link.  Current as of: February 04, 2021??????????????????????????????Content Version: 13.4  ?? 2006-2022 Healthwise, Incorporated.   Care instructions adapted under license by [facility]. If you have questions about a medical condition or this instruction, always ask your healthcare professional. [place] any warranty or liability for your use of this information.           Learning About Changing a Habit by Setting Goals  How can you change a habit?     If you've decided to change a habit--whether it's quitting smoking, lowering your blood pressure, becoming more active, or doing something else to improve your health--congratulations! Making that decision is the first step toward making a change.  What happens next? Have a reason. Set goals you can reach. Prepare for slip-ups. And get support.  What's your reason?  Your reason for wanting to change a habit is really important. Maybe you want to quit smoking so that you can avoid future health problems. Or maybe you want to eat a healthier diet so you can lose weight. If you have high blood pressure, your reason may be clear: to lower your blood pressure. Maybe you smoke and want to save money on cigarettes.  You need to feel ready to make a change. If you don't feel ready now, that's okay. You can still be thinking and planning. When you truly want to make changes, you're ready for the next step.  It's not easy to change habits--but you can do it. Taking the time to really think about what will motivate or inspire you will help you reach your goals.  How do you set goals?  Setting goals can help a lot when you're trying to make a healthy change.  Focus on small goals. This will help you reach larger goals over time. With smaller goals, you'll have success more often, which will help you stay with it. For example, your large goal may be to  lose 20 pounds. Your small goal could be to lose 5.  Write down your goals. This will help you remember, and you'll have a clearer idea of what you want to achieve. Use a journal or notebook to record your goals. Hang up your plan where you will see it often as a reminder of what you're trying to do.  Make your goals specific. Specific goals  help you measure your progress. For example, setting a goal to eat one extra serving of vegetables a day is better than a general goal to "eat more vegetables."  Focus on one goal at a time. By doing this, you're less likely to feel overwhelmed and then give up.  When you reach a goal, reward yourself.  Celebrate your new behavior and success for several days, and then think about setting your next goal.  How can you prepare for slip-ups?  It's perfectly normal to try to change a habit, go along fine for a while, and then have a setback. Lots of people try and try again before they reach their goals.  What are the things that might cause a setback for you? If you have tried to change a habit before, think about what helped you and what got in your way.  By thinking about these barriers now, you can plan ahead for how to deal with them if they happen.  There will be times when you slip up and don't make your goal for the week. When that happens, don't get mad at yourself. Learn from the experience. Ask yourself what got in the way of reaching your goal. Positive thinking goes a long way when you're making lifestyle changes.  How can you get support?  Get a partner. It's motivating to know that someone is trying to make the same change that you're making, like being more active or changing your eating habits. You have someone who is counting on you to help them succeed. That person can also remind you how far you've come.  Get friends and family involved. They can exercise with you. Or they can encourage you by saying how they admire what you are doing. Family members can join you  in your healthy eating efforts. Don't be afraid to tell family and friends that their encouragement makes a big difference to you.  Join a class or support group. People in these groups often have some of the same barriers you have. They can give you support when you don't feel like staying with your plan. They can boost your morale when you need a lift. You'll also find a number of online support groups.  Encourage yourself. When you feel like giving up, don't waste energy feeling bad about yourself. Remember your reason for wanting to change, think about the progress you've made, and give yourself a pep talk and a pat on the back.  Get professional help. A dietitian can help you make your diet healthier while still allowing you to eat foods that you enjoy. A trainer or physical therapist can help design an exercise program that is fun and easy to stay on. A counselor, a Education officer, museum, or your doctor can help you overcome hurdles, reduce stress, or quit smoking.  Where can you learn more?  Go to [email or web address] and sign in to your MyChart account. Enter [phone] in the [place] box to learn more about "Learning About Changing a Habit by Setting Goals."     If you do not have an account, please click on the "Sign Up Now" link.  Current as of: January 07, 2021??????????????????????????????Content Version: 13.4  ?? 2006-2022 Healthwise, Incorporated.   Care instructions adapted under license by [facility]. If you have questions about a medical condition or this instruction, always ask your healthcare professional. [place] any warranty or liability for your use of this information.  A Healthy Lifestyle: Care Instructions  A healthy lifestyle can help you feel good, have more energy, and stay at a weight that's healthy for you. You can share a healthy lifestyle with your friends and family. And you can do it on your own.  Eat meals with your friends or family.  You could try cooking together.  Plan activities with other people. Go for a walk with a friend, try a free online fitness class, or join a sports league.  Eat a variety of healthy foods. These include fruits, vegetables, whole grains, low-fat dairy, and lean protein.  Choose healthy portions of food. You can use the Nutrition Facts label on food packages as a guide.  Eat more fruits and vegetables. You could add vegetables to sandwiches or add fruit to cereal.  Drink water when you are thirsty. Limit soda, juice, and sports drinks.  Try to exercise most days. Aim for at least 2?? hours of exercise each week.  Keep moving. Work in the garden or take your dog on a walk. Use the stairs instead of the elevator.  If you use tobacco or nicotine, try to quit. Ask your doctor about programs and medicines to help you quit.  Limit alcohol. Men should have no more than 2 drinks a day. Women should have no more than 1. For some people, no alcohol is the best choice.  Follow-up care is a key part of your treatment and safety. Be sure to make and go to all appointments, and call your doctor if you are having problems. It's also a good idea to know your test results and keep a list of the medicines you take.  Where can you learn more?  Go to [email or web address] and sign in to your MyChart account. Enter [phone] in the [place] box to learn more about "A Healthy Lifestyle: Care Instructions."     If you do not have an account, please click on the "Sign Up Now" link.  Current as of: February 04, 2021??????????????????????????????Content Version: 13.4  ?? 2006-2022 Healthwise, Incorporated.   Care instructions adapted under license by [facility]. If you have questions about a medical condition or this instruction, always ask your healthcare professional. [place] any warranty or liability for your use of this information.      Heart-Healthy Diet   Sodium, Fat, and Cholesterol Controlled Diet        What Is a Heart Healthy Diet?   A heart-healthy diet is one that limits sodium , certain types of fat , and cholesterol . This type of diet is recommended for:   People with any form of cardiovascular disease (eg, coronary heart disease , peripheral vascular disease , previous heart attack , previous stroke )   People with risk factors for cardiovascular disease, such as high blood pressure , high cholesterol , or diabetes   Anyone who wants to lower their risk of developing cardiovascular disease   Sodium    Sodium is a mineral found in many foods. In general, most people consume much more sodium than they need. Diets high in sodium can increase blood pressure and lead to edema (water retention). On a heart-healthy diet, you should consume no more than 2,300 mg (milligrams) of sodium per dayabout the amount in one teaspoon of table salt. The foods highest in sodium include table salt (about 50% sodium), processed foods, convenience foods, and preserved foods.   Cholesterol    Cholesterol is a fat-like, waxy substance in your  blood. Our bodies make some cholesterol. It is also found in animal products, with the highest amounts in fatty meat, egg yolks, whole milk, cheese, shellfish, and organ meats. On a heart-healthy diet, you should limit your cholesterol intake to less than 200 mg per day.   It is normal and important to have some cholesterol in your bloodstream. But too much cholesterol can cause plaque to build up within your arteries, which can eventually lead to a heart attack or stroke.   The two types of cholesterol that are most commonly referred to are:   Low-density lipoprotein (LDL) cholesterol  Also known as bad cholesterol, this is the cholesterol that tends to build up along your arteries. Bad cholesterol levels are increased by eating fats that are saturated or hydrogenated. Optimal level of this cholesterol is less than 100. Over 130 starts to get risky for heart disease.   High-density  lipoprotein (HDL) cholesterol  Also known as good cholesterol, this type of cholesterol actually carries cholesterol away from your arteries and may, therefore, help lower your risk of having a heart attack. You want this level to be high (ideally greater than 60). It is a risk to have a level less than 40. You can raise this good cholesterol by eating olive oil, canola oil, avocados, or nuts. Exercise raises this level, too.   Fat    Fat is calorie dense and packs a lot of calories into a small amount of food. Even though fats should be limited due to their high calorie content, not all fats are bad. In fact, some fats are quite healthful. Fat can be broken down into four main types.   The good-for-you fats are:   Monounsaturated fat  found in oils such as olive and canola, avocados, and nuts and natural nut butters; can decrease cholesterol levels, while keeping levels of HDL cholesterol high   Polyunsaturated fat  found in oils such as safflower, sunflower, soybean, corn, and sesame; can decrease total cholesterol and LDL cholesterol   Omega-3 fatty acids  particularly those found in fatty fish (such as salmon, trout, tuna, mackerel, herring, and sardines); can decrease risk of arrhythmias, decrease triglyceride levels, and slightly lower blood pressure   The fats that you want to limit are:   Saturated fat  found in animal products, many fast foods, and a few vegetables; increases total blood cholesterol, including LDL levels   Animal fats that are saturated include: butter, lard, whole-milk dairy products, meat fat, and poultry skin   Vegetable fats that are saturated include: hydrogenated shortening, palm oil, coconut oil, cocoa butter   Hydrogenated or trans fat  found in margarine and vegetable shortening, most shelf stable snack foods, and fried foods; increases LDL and decreases HDL     It is generally recommended that you limit your total fat for the day to less than 30% of your total calories. If you  follow an 1800-calorie heart healthy diet, for example, this would mean 60 grams of fat or less per day.   Saturated fat and trans fat in your diet raises your blood cholesterol the most, much more than dietary cholesterol does. For this reason, on a heart-healthy diet, less than 7% of your calories should come from saturated fat and ideally 0% from trans fat. On an 1800-calorie diet, this translates into less than 14 grams of saturated fat per day, leaving 46 grams of fat to come from mono- and polyunsaturated fats.   Food Choices on a Heart  Healthy Diet   Food Category   Foods Recommended   Foods to Avoid   Grains   Breads and rolls without salted tops Most dry and cooked cereals Unsalted crackers and breadsticks Low-sodium or homemade breadcrumbs or stuffing All rice and pastas   Breads, rolls, and crackers with salted tops High-fat baked goods (eg, muffins, donuts, pastries) Quick breads, self-rising flour, and biscuit mixes Regular bread crumbs Instant hot cereals Commercially prepared rice, pasta, or stuffing mixes   Vegetables   Most fresh, frozen, and low-sodium canned vegetables Low-sodium and salt-free vegetable juices Canned vegetables if unsalted or rinsed   Regular canned vegetables and juices, including sauerkraut and pickled vegetables Frozen vegetables with sauces Commercially prepared potato and vegetable mixes   Fruits   Most fresh, frozen, and canned fruits All fruit juices   Fruits processed with salt or sodium   Milk   Nonfat or low-fat (1%) milk Nonfat or low-fat yogurt Cottage cheese, low-fat ricotta, cheeses labeled as low-fat and low-sodium   Whole milk Reduced-fat (2%) milk Malted and chocolate milk Full fat yogurt Most cheeses (unless low-fat and low salt) Buttermilk (no more than 1 cup per week)   Meats and Beans   Lean cuts of fresh or frozen beef, veal, lamb, or pork (look for the word loin) Fresh or frozen poultry without the skin Fresh or frozen fish and some shellfish Egg whites and  egg substitutes (Limit whole eggs to three per week) Tofu Nuts or seeds (unsalted, dry-roasted), low-sodium peanut butter Dried peas, beans, and lentils   Any smoked, cured, salted, or canned meat, fish, or poultry (including bacon, chipped beef, cold cuts, hot dogs, sausages, sardines, and anchovies) Poultry skins Breaded and/or fried fish or meats Canned peas, beans, and lentils Salted nuts   Fats and Oils   Olive oil and canola oil Low-sodium, low-fat salad dressings and mayonnaise   Butter, margarine, coconut and palm oils, bacon fat   Snacks, Sweets, and Condiments   Low-sodium or unsalted versions of broths, soups, soy sauce, and condiments Pepper, herbs, and spices; vinegar, lemon, or lime juice Low-fat frozen desserts (yogurt, sherbet, fruit bars) Sugar, cocoa powder, honey, syrup, jam, and preserves Low-fat, trans-fat free cookies, cakes, and pies Graham and animal crackers, fig bars, ginger snaps   High-fat desserts Broth, soups, gravies, and sauces, made from instant mixes or other high-sodium ingredients Salted snack foods Canned olives Meat tenderizers, seasoning salt, and most flavored vinegars   Beverages   Low-sodium carbonated beverages Tea and coffee in moderation Soy milk   Commercially softened water   Suggestions   Make whole grains, fruits, and vegetables the base of your diet.    Choose heart-healthy fats such as canola, olive, and flaxseed oil, and foods high in heart-healthy fats, such as nuts, seeds, soybeans, tofu, and fish.    Eat fish at least twice per week; the fish highest in omega-3 fatty acids and lowest in mercury include salmon, herring, mackerel, sardines, and canned chunk light tuna. If you eat fish less than twice per week or have high triglycerides, talk to your doctor about taking fish oil supplements.    Read food labels.   For products low in fat and cholesterol, look for fat free, low-fat, cholesterol free, saturated fat free, and trans fat freeAlso scan the Nutrition  Facts Label, which lists saturated fat, trans fat, and cholesterol amounts.   For products low in sodium, look for sodium free, very low sodium, low sodium, no added salt, and  unsalted   Skip the salt when cooking or at the table; if food needs more flavor, get creative and try out different herbs and spices. Garlic and onion also add substantial flavor to foods.    Trim any visible fat off meat and poultry before cooking, and drain the fat off after browning.    Use cooking methods that require little or no added fat, such as grilling, boiling, baking, poaching, broiling, roasting, steaming, stir-frying, and sauting.    Avoid fast food and convenience food. They tend to be high in saturated and trans fat and have a lot of added salt.    Talk to a registered dietitian for individualized diet advice.      Last Reviewed: March 2011 Suzanne Boron, MS, MPH, RD   Updated: 02/24/2010     WELL ADULT LIFESTYLE INSTRUCTIONS:    Pick a day in the next week to spend an hour reviewing the information below then:     1) determine your health goals for the year   2) determine what changes you need to achieve those goals   3) design your daily routine, shopping habits etc to implement those changes        Default Right Action (no choices)       Make it EASY to do the RIGHT THINGS.   4) I invite you to send me your plans via mychart so I can continue to help you       with them    Examine your lifestyle with an emphasis on BARRIERS to bad and good habits and how you can design your life to make better choices.    If you want to feel better these are the FUNDAMENTAL PILLARS of Wellness:    1)  You can choose to Get 150 min/week of moderate exercise (can talk but can't        sing) or 75 min/week of vigorous exercise (can't talk).   This will enhance your sense of well being (Exercise is as good as medicine for   depression.)    2)  You can choose to Get 7-9 hours of sleep per night    Detoxifies your brain, reduces risk of  dementia    3)  You can choose to Strength Train 2 x a week on non-consecutive days   This will improve function and reduce risk of injury.   Body weight type exercises   such as Yoga and Pilates are excellent choices.    4)  You can choose Good Nutrition.  Only eat your goal weight (in lbs) x 10        calories/day and get 5 servings of Vegetables/day   Plant based diets reduce risk of heart attack/stroke and will help you feel full on   less food.   Avoid highly processed foods and processed carbohydrates.    5)  You can choose Moderate alcohol intake < 1-2 drinks/day   Alcohol will disrupt your sleep and add calories to your day.    6)  You can choose to Develop a Charismatic/Supportive relationship.     This will strengthen your resilience for the ups and downs.    7)  You can choose to Practice Mindfulness.     An hour a day of prayer/meditation/gratitude will change your life!    If you are trying to lose weight, here are some recommendations for weight loss:  Not every weight loss program is appropriate for everybody...  good online sources include  Noom (more social with daily check ins), Lifesum (similar but less social) and Naturally slim, as well as Brandneu ($1500)    The GI Diet or "Primal diet", Intermittent fasting can also be effective choices.    If you have diabetes treated with insulin be sure to ask for specific guidance around meals.    Take your desired weight in pounds and multiply by 10 and that is your average daily calorie allowance.  For example if you wish to weigh 170 lb x 10 = 1700 cal/day (this is how to gradually lose the weight and maintain your desired weight).    Avoid soda/coke and all "wet carbs" => Drink ice water instead    Drink a large glass of ice water before meals and EAT SLOWLY (talk while you eat)!    Rethink your hunger => it means your losing weight.    Minimize highly processed carbohydrates as they stimulate your appetite:  Specifically cut back on Bread, Rice, Pasta  and Potatoes    Avoid eating calories after 6 pm        DASH Diet: After Your Visit  Your Care Instructions  The DASH diet is an eating plan that can help lower your blood pressure. DASH stands for Dietary Approaches to Stop Hypertension. Hypertension is high blood pressure.  The DASH diet focuses on eating foods that are high in calcium, potassium, and magnesium. These nutrients can lower blood pressure. The foods that are highest in these nutrients are fruits, vegetables, low-fat dairy products, nuts, seeds, and legumes. But taking calcium, potassium, and magnesium supplements instead of eating foods that are high in those nutrients does not have the same effect. The DASH diet also includes whole grains, fish, and poultry.  The DASH diet is one of several lifestyle changes your doctor may recommend to lower your high blood pressure. Your doctor may also want you to decrease the amount of sodium in your diet. Lowering sodium while following the DASH diet can lower blood pressure even further than just the DASH diet alone.  Follow-up care is a key part of your treatment and safety. Be sure to make and go to all appointments, and call your doctor if you are having problems. It???s also a good idea to know your test results and keep a list of the medicines you take.  How can you care for yourself at home?  Following the DASH diet  Eat 4 to 5 servings of fruit each day. A serving is 1 medium-sized piece of fruit, ?? cup chopped or canned fruit, 1/4 cup dried fruit, or 4 ounces (?? cup) of fruit juice. Choose fruit more often than fruit juice.  Eat 4 to 5 servings of vegetables each day. A serving is 1 cup of lettuce or raw leafy vegetables, ?? cup of chopped or cooked vegetables, or 4 ounces (?? cup) of vegetable juice. Choose vegetables more often than vegetable juice.  Get 2 to 3 servings of low-fat and fat-free dairy each day. A serving is 8 ounces of milk, 1 cup of yogurt, or 1 ?? ounces of cheese.  Eat 7 to 8 servings of  grains each day. A serving is 1 slice of bread, 1 ounce of dry cereal, or ?? cup of cooked rice, pasta, or cooked cereal. Try to choose whole-grain products as much as possible.  Limit lean meat, poultry, and fish to 6 ounces each day. Six ounces is about the size of two decks of cards.  Eat 4 to  5 servings of nuts, seeds, and legumes (cooked dried beans, lentils, and split peas) each week. A serving is 1/3 cup of nuts, 2 tablespoons of seeds, or ?? cup cooked dried beans or peas.  Limit sweets and added sugars to 5 servings or less a week. A serving is 1 tablespoon jelly or jam, ?? cup sorbet, or 1 cup of lemonade.  Tips for success  Start small. Do not try to make dramatic changes to your diet all at once. You might feel that you are missing out on your favorite foods and then be more likely to not follow the plan. Make small changes, and stick with them. Once those changes become habit, add a few more changes.  Try some of the following:  Make it a goal to eat a fruit or vegetable at every meal and at snacks. This will make it easy to get the recommended amount of fruits and vegetables each day.  Try yogurt topped with fruit and nuts for a snack or healthy dessert.  Add lettuce, tomato, cucumber, and onion to sandwiches.  Combine a ready-made pizza crust with low-fat mozzarella cheese and lots of vegetable toppings. Try using tomatoes, squash, spinach, broccoli, carrots, cauliflower, and onions.  Have a variety of cut-up vegetables with a low-fat dip as an appetizer instead of chips and dip.  Sprinkle sunflower seeds or chopped almonds over salads. Or try adding chopped walnuts or almonds to cooked vegetables.  Try some vegetarian meals using beans and peas. Add garbanzo or kidney beans to salads. Make burritos and tacos with mashed pinto beans or black beans    ?? 2006-2012 [employer]. Care instructions adapted under license by [facility]. This care instruction is for use with your  licensed healthcare professional. If you have questions about a medical condition or this instruction, always ask your healthcare professional. [place] any warranty or liability for your use of this information.  Content Version: 9.4.94723; Last Revised: February 11, 2011                Alcohol Abuse and Alcoholism   (Alcohol Dependence; Alcohol Use Disorder)       Definition   Alcohol abuse is excessive or problematic alcohol consumption. It can progress to alcoholism.   Alcoholism is chronic alcohol abuse that results in a physical dependence on alcohol (withdrawal symptoms) and an inability to stop or limit drinking.   Causes   Several factors can contribute to alcohol abuse and alcoholism, including:   Genes   Brain chemicals that may be different than normal   Social pressure   Emotional stress   Pain   Depression and other mental health problems   Problem drinking behaviors learned from family or friends   Risk Factors   These factors increase your chance of developing alcoholism. Tell your doctor if you have any of these risk factors:   Sex: male   Family members who abuse alcohol (especially men whose fathers or brothers are alcoholic)   Starting to use alcohol at an early age (younger than 40)   Using illicit drugs or non-medical use of prescription drugs   Peer pressure   Easy access to alcoholic beverages   Psychiatric disorders, such as depression or anxiety   Smoking   Symptoms   It is common to deny an alcohol problem. Alcohol abuse can occur without physical dependence.   Alcohol abuse symptoms include:   Repeated work, school, or home problems due to drinking  Risking physical safety   Recurring trouble with the law, often including drinking and driving   Continuing to drink despite alcohol-related difficulties   Symptoms of alcoholism include:   Craving a drink   Unable to stop or limit drinking   Needing greater amounts of alcohol to feel the same effect   Giving up  activities in order to drink or recover from alcohol   Drinking that continues even when it causes or worsens health problems   Wanting to stop or reduce drinking, but not being able   Withdrawal symptoms if alcohol is stopped include:   Nausea   Sweating   Shaking   Anxiety   Increased blood pressure   Seizures ( delirium tremens [DTs])   The brain, nervous system, heart, liver, stomach, gastrointestinal tract, and pancreas can all be damaged by alcoholism.     Some of the Organs Damaged in Alcohol Abuse        2011 [place].   Diagnosis   Doctors ask a series of questions to assess possible alcohol-related problems, including:   Have you tried to reduce your drinking?   Have you felt bad about drinking?   Have you been annoyed by another person's criticism of your drinking?   Do you drink in the morning to steady your nerves or cure a hangover?   Do you have problems with a job, your family, or the law?   Do you drive under the influence of alcohol?   Blood tests may be done to:   Look at the size of your red blood cells and to check for a substance called carbohydrate-deficient transferrin   Check for alcohol-related liver disease and other health problems   Treatment   Treatment for alcohol abuse or dependence is aimed at teaching patients how to manage the disease. Most professionals believe that this means giving up alcohol completely and permanently.   The first and most important step is recognizing a problem exists. Successful treatment depends on your desire to change. Your doctor can help you withdraw from alcohol safely. This could require hospitalization in a detoxification center. They will carefully monitor you for side effects. You may need medication while you are undergoing detoxification.   Treatments include:   Medications    Drugs can help relieve some of the symptoms of withdrawal and help prevent relapse. The doctor may prescribe medication to reduce cravings for alcohol.    Medications used to treat alcoholism and to try to prevent drinking include:   Naltrexone (ReVia, Vivitrol)blocks the high that makes you crave alcohol   Disulfiram (Antabuse)makes you very sick if you drink alcohol   Acamprosate (Campral)reduces your craving for alcohol   A study showed that an anticonvulsant drug, topiramate (Topamax), may reduce alcohol dependence.   Education and Counseling    Therapy helps you to recognize alcohol's dangers. Education raises awareness of underlying issues and lifestyles that promote drinking. In therapy, you work to improve coping skills and learn other ways of dealing with stress or pain.   Mentoring and Community Help    Alcoholics Anonymous (AA) helps many people to stop drinking and stay sober. Members meet regularly and support each other. Your family members may also benefit from attending meetings of Al-Anon. Living with an alcoholic can be a painful, stressful situation.   Here are some general statistics on treatment outcomes of individuals one year after attempting to stop drinking:   1/3 remained abstinent   1/3 resumed drinking  but at a lower level   1/3 relapsed completely   If you are diagnosed with alcohol abuse or alcoholism, follow your doctor's instructions .   Prevention   Realizing that alcohol causes problems helps some people avoid it. Suggestions to decrease the risk of alcohol abuse and dependence include:   Socialize without alcohol.   Avoid going to bars.   Do not keep alcohol in your home.   Avoid situations and people that encourage drinking.   Make new nondrinking friends.   Do fun things that do not involve alcohol.   Avoid reaching for a drink when stressed or upset.   Limit your alcohol intake to a moderate level.   Moderate is two or fewer drinks per day for men and one or fewer for women and older adults   A 12-ounce bottle of beer, a five-ounce glass of wine, or 1.5 ounces of liquor is considered one drink   If you are a parent, having a  good relationship with your children may reduce their risk of alcohol abuse.   Most professionals who treat alcohol abuse and dependence believe that complete abstinence is the only effective prevention.     Last Reviewed: September 2010 Karn Cassis, MD, PhD, MPH   Updated: 08/18/2009     Patient information: Weight loss treatments    INTRODUCTION??--??Obesity is a major international problem, and Americans are among the heaviest people in the world. The percentage of obese people in the [place] has risen steadily.  Many people find that although they initially lose weight by dieting, they quickly regain the weight after the diet ends. Because it so hard to keep weight off over time, it is important to have as much information and support as possible before starting a diet. You are most likely to be successful in losing weight and keeping it off when you believe that your body weight can be controlled.  STARTING A WEIGHT LOSS PROGRAM??--??Some people like to talk to their doctor or nurse to get help choosing the best plan, monitoring progress, and getting advice and support along the way.  To know what treatment (or combination of treatments) will work best, determine your body mass index (BMI) and waist circumference (measurement). The BMI is calculated from your height and weight.  A person with a BMI between 25 and 29.9 is considered overweight   A person with a BMI of 30 or greater is considered to be obese  A waist circumference greater than 35 inches (88 cm) in women and 40 inches (102 cm) in men increases the risk of obesity-related complications, such as heart disease and diabetes. People who are obese and who have a larger waist size may need more aggressive weight loss treatment than others. Talk to your doctor or nurse for advice.  Types of treatment??--??Based on your measurements and your medical history, your doctor or nurse can determine what combination of weight loss treatments would work best for  you. Treatments may include changes in lifestyle, exercise, dieting, and, in some cases, weight loss medicines or weight loss surgery. Weight loss surgery, also called bariatric surgery, is reserved for people with severe obesity who have not responded to other weight loss treatments.   SETTING A WEIGHT LOSS GOAL??--??It is important to set a realistic weight loss goal. Your first goal should be to avoid gaining more weight and staying at your current weight (or within 5 percent). Many people have a "dream" weight that is difficult or impossible to achieve.  People at high risk of developing diabetes who are able to lose 5 percent of their body weight and maintain this weight will reduce their risk of developing diabetes by about 50 percent and reduce their blood pressure. This is a success.  Losing more than 15 percent of your body weight and staying at this weight is an extremely good result, even if you never reach your "dream" or "ideal" weight.  LIFESTYLE CHANGES??--??Programs that help you to change your lifestyle are usually run by psychologists or other professionals. The goals of lifestyle changes are to help you change your eating habits, become more active, and be more aware of how much you eat and exercise, helping you to make healthier choices.  This type of treatment can be broken down into three steps:  The triggers that make you want to eat   Eating   What happens after you eat  Triggers to eat??--??Determining what triggers you to eat involves figuring out what foods you eat and where and when you eat. To figure out what triggers you to eat, keep a record for a few days of everything you eat, the places where you eat, how often you eat, and the emotions you were feeling when you ate.  For some people, the trigger is related to a certain time of Marik or night. For others, the trigger is related to a certain place, like sitting at a desk working.  Eating??--??You can change your eating habits by breaking the  chain of events between the trigger for eating and eating itself. There are many ways to do this. For instance, you can:  Limit where you eat to a few places (eg, dining room)   Restrict the number of utensils (eg, only a fork) used for eating   Drink a sip of water between each bite   Chew your food a certain number of times   Get up and stop eating every few minutes  What happens after you eat??--??Rewarding yourself for good eating behaviors can help you to develop better habits. This is not a reward for weight loss; instead, it is a reward for changing unhealthy behaviors.  Do not use food as a reward. Some people find money, clothing, or personal care (eg, a hair cut, manicure, or massage) to be effective rewards. Treat yourself immediately after making better eating choices to reinforce the value of the good behavior.  You need to have clear behavior goals, and you must have a time frame for reaching your goals. Reward small changes along the way to your final goal.  Other factors that contribute to successful weight loss??--??Changing your behavior involves more than just changing unhealthy eating habits; it also involves finding people around you to support your weight loss, reducing stress, and learning to be strong when tempted by food.  Establish a "buddy" system -- Having a friend or family member available to provide support and reinforce good behavior is very helpful. The support person needs to understand your goals.   Learn to be strong -- Learning to be strong when tempted by food is an important part of losing weight. As an example, you will need to learn how to say "no" and continue to say no when urged to eat at parties and social gatherings. Develop strategies for events before you go, such as eating before you go or taking low-calorie snacks and drinks with you.   Develop a support system -- Having a support system is helpful when losing weight. This  is why many commercial groups are successful.  Family support is also essential; if your family does not support your efforts to lose weight, this can slow your progress or even keep you from losing weight.   Positive thinking -- People often have conversations with themselves in their head; these conversations can be positive or negative. If you eat a piece of cake that was not planned, you may respond by thinking, "Oh, you stupid idiot, you've blown your diet!" and as a result, you may eat more cake.  A positive thought for the same event could be, "Well, I ate cake when it was not on my plan. Now I should do something to get back on track." A positive approach is much more likely to be successful than a negative one.  Reduce stress -- Although stress is a part of everyday life, it can trigger uncontrolled eating in some people. It is important to find a way to get through these difficult times without eating or by eating low-calorie food, like raw vegetables. It may be helpful to imagine a relaxing place that allows you to temporarily escape from stress. With deep breaths and closed eyes, you can imagine this relaxing place for a few minutes.   Self-help programs -- Self-help programs like [employer] Watchers??, Overeaters Anonymous??, and Take Off Pounds Sensibly (TOPS)?? work for some people. As with all weight loss programs, you are most likely to be successful with these plans if you make long-term changes in how you eat.  CHOOSING A DIET??--??A calorie is a unit of energy found in food. Your body needs calories to function. The goal of any diet is to burn up more calories than you eat.   How quickly you lose weight depends upon several factors, such as your age, gender, and starting weight.  Older people have a slower metabolism than young people, so they lose weight more slowly.   Men lose more weight than women of similar height and weight when dieting because they use more energy.   People who are extremely overweight lose weight more quickly than those who  are only mildly overweight.  Try not to drink alcohol or drinks with added sugar, and most sweets (candy, cakes, cookies), since they rarely contain important nutrients.  Portion-controlled diets??--??One simple way to diet is to buy packaged foods, like frozen low-calorie meals or meal-replacement canned drinks. A typical meal plan for one day may include:  A meal-replacement drink or breakfast bar for breakfast   A meal-replacement drink or a frozen low-calorie (250 to 350 calories) meal for lunch   A frozen low-calorie meal or other prepackaged, calorie-controlled meal, along with extra vegetables for dinner  This would give you 1000 to 1500 calories per day.  Low-fat diet??--??To reduce the amount of fat in your diet, you can:  Eat low-fat foods. Low-fat foods are those that contain less than 30 percent of calories from fat. Fat is listed on the food facts label (figure 1).   Count fat grams. For a 1500 calorie diet, this would mean about 45 g or fewer of fat per day.  Low-carbohydrate diet??--??Low- and very-low-carbohydrate diets (eg, Atkins diet, [place]) have become popular ways to lose weight quickly.  With a very-low-carbohydrate diet, you eat between 0 and 60 grams of carbohydrates per day (a standard diet contains 200 to 300 grams of carbohydrates)   With a low-carbohydrate diet, you eat between 60 and 130 grams of carbohydrates per day  Carbohydrates are found  in fruits, vegetables, and grains (including breads, rice, pasta, and cereal), alcoholic beverages, and in dairy products. Meat and fish do not contain carbohydrates.  Side effects of very-low-carbohydrate diets can include constipation, headache, bad breath, muscle cramps, diarrhea, and weakness.  Mediterranean diet??--??The term "Mediterranean diet" refers to a way of eating that is common in olive[place]growing regions around the [place]. Although there is some variation in Mediterranean diets, there are some similarities. Most  Mediterranean diets include:  A high level of monounsaturated fats (from olive or canola oil, walnuts, pecans, almonds) and a low level of saturated fats (from butter)   A high amount of vegetables, fruits, legumes, and grains (7 to 10 servings of fruits and vegetables per day)   A moderate amount of milk and dairy products, mostly in the form of cheese. Use low-fat dairy products (skim milk, fat-free yogurt, low-fat cheese).   A relatively low amount of red meat and meat products. Substitute fish or poultry for red meat.   For those who drink alcohol, a modest amount (mainly as red wine) may help to protect against cardiovascular disease. A modest amount is up to one (4 ounce) glass per day for women and up to two glasses per day for men.  Which diet is best???--??Studies have compared different diets, including:  Very-low-carbohydrate (Atkins???)   Macronutrient balance controlling glycemic load (Zone??)   Reduced-calorie (Weight Watchers??)   Very-low-fat ([place])  No one diet is "best" for weight loss. Any diet will help you to lose weight if you stick with the diet. Therefore, it is important to choose a diet that includes foods you like.  Fad diets??--??Fad diets often promise quick weight loss (more than 1 to 2 pounds per week) and may claim that you do not need to exercise or give up favorite foods. Some fad diets cost a lot of money, because you have to pay for seminars or pills. Fad diets generally lack any scientific evidence that they are safe and effective, but instead rely on "before" and "after" photos or testimonials.  Diets that sound too good to be true usually are. These plans are a waste of time and money and are not recommended. A doctor, nurse, or nutritionist can help you find a safe and effective way to lose weight and keep it off.  WEIGHT LOSS MEDICINES??--??Taking a weight loss medicine may be helpful when used in combination with diet, exercise, and lifestyle changes. However, it is important to  understand the risks and benefits of these medicines. It is also important to be realistic about your goal weight using a weight loss medicine; you may not reach your "dream" weight, but you may be able to reduce your risk of diabetes or heart disease.   Weight loss medicines may be recommended for people who have not been able to lose weight with diet and exercise who have a:  BMI of 30 or more    BMI between 27 and 29.9 and have other medical problems, such as diabetes, high cholesterol, or high blood pressure  Two weight loss medicines are approved in the [place] for long-term use. These are sibutramine??and orlistat.  Other weight loss medicines (phentermine, diethylpropion) are available but are only approved for short-term use (up to 12 weeks).  Sibutramine??--??Sibutramine??(Meridia??, Reductil??) is a medicine that reduces your appetite. In people who take the medicine for one year, the average weight loss is 10 percent of the initial body weight (5 percent more than those who took a placebo  treatment).  Side effects of sibutramine??include insomnia, dry mouth, and constipation. Increases in blood pressure can occur. Therefore, blood pressure is usually monitored during treatment. There is no evidence that sibutramine causes heart or lung problems (like dexfenfluramine and fenfluramine (Phen/Fen)). However, experts agree that sibutramine should not used by people with coronary heart disease, heart failure, uncontrolled hypertension, stroke, irregular heart rhythms, or peripheral vascular disease (poor circulation in the legs).  Orlistat??--??Orlistat??(Xenical?? 120 mg capsules) is a medicine that reduces the amount of fat your body absorbs from the foods you eat. A lower-dose version is now available without a prescription (Alli?? 60 mg capsules) in many countries, including the [place]. The medicine is recommended three times per [place], taken with a meal; you can skip a dose if you skip a meal or if the meal  contains no fat.  After one year of treatment with orlistat, the average weight loss is approximately 8 to 10 percent of initial body weight (4 percent more than in those who took a placebo). Cholesterol levels often improve, and blood pressure sometimes falls. In people with diabetes, orlistat may help control blood sugar levels.  Side effects occur in 15 to 10 percent of people and may include stomach cramps, gas, diarrhea, leakage of stool, or oily stools. These problems are more likely when you take orlistat??with a high-fat meal (if more than 30 percent of calories in the meal are from fat). Side effects usually improve as you learn to avoid high-fat foods. Severe liver injury has been reported rarely in patients taking orlistat, but it is not known if orlistat caused the liver problems.  Diet supplements??--??Diet supplements are widely used by people who are trying to lose weight, although the safety and efficacy of these supplements are often unproven. A few of the more common diet supplements are discussed below; none of these are recommended because they have not been studied carefully, and there is no proof they are safe or effective.  Chitosan??and wheat dextrin are ineffective for weight loss, and their use is not recommended.   Ephedra, a compound related to ephedrine, is no longer available in the [place] due to safety concerns. Many nonprescription diet pills previously contained ephedra. Although some studies have shown that ephedra helps with weight loss, there can be serious side effects (psychiatric symptoms, palpitations, and stomach upset), including death.   There are not enough data about the safety and efficacy of chromium, ginseng, glucomannan, green tea, hydroxycitric acid, L carnitine, psyllium, pyruvate supplements, [place] wort, and conjugated linoleic acid.   Two supplements from [place], [place] Sim (also known as the [place] pill) and Herbathin dietary supplement, have  been shown to contain prescription drugs.   Hoodia gordonii is a dietary supplement derived from a plant in [place]. It is not recommended because there is no proof that it is safe or effective.   Bitter orange (Citrus aurantium) can increase your heart rate and blood pressure and is not recommended.  SHOULD I HAVE SURGERY TO LOSE WEIGHT? -- Weight loss surgery is recommended ONLY for people with one of the following:  Severe obesity (body mass index above 40) (calculator 1 and calculator 2) who have not responded to diet, exercise, or weight loss medicines   Body mass index between 35 and 40, along with a serious medical problem (including diabetes, severe joint pain, or sleep apnea) that would improve with weight loss  You should be sure that you understand the potential risks and benefits of  weight loss surgery. You must be motivated and willing to make lifelong changes in how you eat to reach and maintain a healthier weight after surgery. You must also be realistic about weight loss after surgery (see 'Effectiveness of weight loss surgery' below).  PREPARING FOR WEIGHT LOSS SURGERY -- Most people who have weight loss surgery will meet with several specialists before surgery is scheduled. This often includes a dietitian, mental health counselor, a doctor who specializes in care of obese people, and a surgeon who performs weight loss surgery (bariatric surgeon). You may need to work with these providers for several weeks or months before surgery.  The nutritionist will explain what and how much you will be able to eat after surgery. You may also need to lose a small amount of weight before surgery.   The mental health specialist will help you to cope with stress and other factors that can make it harder to lose weight or trigger you to eat   The medical doctor will determine whether you need other tests, counseling, or treatment before surgery. He or she might also help you begin a medical weight loss program  so that you can lose some weight before surgery.   The bariatric surgeon will meet with you to discuss the surgeries available to treat obesity. He or she will also make sure you are a good candidate for surgery.   TYPES OF WEIGHT LOSS SURGERY -- There are several types of weight loss surgeries, the most common being lap banding, gastric bypass, and gastric sleeve.  Lap banding -- Laparoscopic adjustable gastric banding (LAGB), or lap banding, is a surgery that uses an adjustable band around the opening to the stomach (figure 1). This reduces the amount of food that you can eat at one time.  Lap banding is done through small incisions, with a laparoscope. The band can be adjusted after surgery, allowing you to eat more or less food. Adjustments to the size and tightness of the band are made by using a needle to add or remove fluid from a port (a small container under the skin that is connected to the band). Adding fluid to the band makes it tighter which restricts the amount of food you can eat and may help you to lose more weight.  Lap banding is a popular choice because it is relatively simple to perform, can be adjusted or removed, and has a low risk of serious complications immediately after surgery. However, weight loss with the lap band depends on your ability to follow the program closely.  You will need to prepare nutritious meals that "work with" the band, not against it. For example, the lap band will not work well if you eat or drink a large amount of liquid calories (like ice cream). The band will not help you to feel full when you eat/drink liquid calories.  Weight loss ranges from 45 to 75 percent after two years. As an example, a person who is 120 pounds overweight could expect to lose approximately 54 to 90 pounds in the two years after lap banding.  Gastric bypass -- Roux-en-Y gastric bypass, also called gastric bypass, helps you to lose weight by reducing the amount of food you can eat and reducing  the number of calories and nutrients you absorb from the food you eat.  To perform gastric bypass, a surgeon creates a small stomach pouch by dividing the stomach and attaching it to the small intestine. This helps you to lose weight in  two ways:  The smaller stomach can hold less food than before surgery. This causes you to feel full after eating a very small amount of food or liquid. Over time, the pouch might stretch, allowing you to eat more food.   The body absorbs fewer calories, since food bypasses most of the stomach as well as the upper small intestine. This new arrangement seems to decrease your appetite and change how you break down foods by changing the release of various hormones.  Gastric bypass can be performed as open surgery (through an incision on the abdomen) or laparoscopically, which uses smaller incisions and smaller instruments. Both the laparoscopic and open techniques have risks and benefits. You and your surgeon should work together to decide which surgery, if any, is right for you.  Gastric bypass has a high success rate, and people lose an average of 62 to 68 percent of their excess body weight in the first year. Weight loss typically levels off after one to two years, with an overall excess weight loss between 50 and 75 percent. For a person who is 120 pounds overweight, an average of 60 to 90 pounds of weight loss would be expected.  Gastric sleeve -- Gastric sleeve, also known as sleeve gastrectomy, is a surgery that reduces the size of the stomach and makes it into a narrow tube (figure 3). The new stomach is much smaller and produces less of the hormone (ghrelin) that causes hunger, helping you feel satisfied with less food.  Sleeve gastrectomy is safer than gastric bypass because the intestines are not rearranged, and there is less chance of malnutrition. It also appears to control hunger better than lap banding. It might be safer than the lap banding because no foreign materials  are used.  The gastric sleeve has a good success rate, and people lose an average of 33 percent of their excess body weight in the first year. For a person who is 120 pounds overweight, this would mean losing about 40 pounds in the first year.  WEIGHT LOSS SURGERY COMPLICATIONS -- A variety of complications can occur with weight loss surgery. The risks of surgery depend upon which surgery you have and any medical problems you had before surgery. Some of the more common early surgical complications (one to six weeks after surgery) include:  Bleeding   Infection   Blockage or tear in the bowels   Need for further surgery  Important medical complications after surgery can include blood clots in the legs or lungs, heart attack, pneumonia, and urinary tract infection.   Complications are less likely when surgery is performed in centers that are experienced in weight loss surgery. In general, centers with experience in weight loss surgery have:  Board-certified doctors and surgeons   A team of support staff (dietitians, counselors, nurses)   Long-term follow-up after surgery   Hospital staff experienced with the care of weight loss patients. This includes nurses who are trained in the care of patients immediately after surgery and anesthesiologists who are experienced in caring for the morbidly obese.  EFFECTIVENESS OF WEIGHT LOSS SURGERY -- The goal of weight loss surgery is to reduce the risk of illness or death associated with obesity. Weight loss surgery can also help you to feel and look better, reduce the amount of money you spend on medicines, and cut down on sick days.   As an example, weight loss surgery can improve health problems related to obesity (diabetes, high blood pressure, high cholesterol, sleep apnea)  to the point that you need less or no medicine.  Finally, weight loss surgery might reduce your risk of developing heart disease, cancer, and certain infections.  AFTER WEIGHT LOSS SURGERY -- You will  need to stay in the hospital until your team feels that it is safe for you to leave (on average, one to three days). Do not drive if you are taking prescription pain medicine. Begin exercising as soon as possible once you have healed; most weight loss centers will design an exercise program for you.  Once you are home, it is important to eat and drink exactly what your doctor and dietitian recommend. You will see your doctor, nurse, and dietitian on a regular basis after surgery to monitor your health, diet, and weight loss.   You will be able to slowly increase how much you eat over time, although it will always be important to:  Eat small, frequent meals and not skip meals   Chew your food slowly and completely   Avoid eating while "distracted" (such as eating while watching TV)   Stop eating when you feel full   Drink liquids at least 30 minutes before or after eating   Avoid foods high in fat or sugar   Take vitamin supplements, as recommended  It can take several months to learn to listen to your body so that you know when you are hungry and when you are full. You may dislike foods you previously loved, and you may begin to prefer new foods. This can be a frustrating process for some people, so talk to your dietitian if you are having trouble.  It usually takes between one and two years to lose weight after surgery. After reaching their goal weight, some people have plastic surgery (called "body contouring") to remove excess skin from the body, particularly in the abdominal area.  Before you decide to have weight loss surgery, you must commit to staying healthy for life. This includes following up with your healthcare team, exercising most days of the week, and eating a sensible diet every day. It can be difficult to develop new eating and exercise habits after weight loss surgery, and you will have to work hard to stick to your goals.  Recovering from surgery and losing weight can be stressful and emotional,  and it is important to have the support of family and friends. Working with a Education officer, museum, therapist, or support group can help you through the ups and downs.  WHERE TO GET MORE INFORMATION??--??Your healthcare provider is the best source of information for questions and concerns related to your medical problem.  This article will be updated as needed every four months on our Web site ([email or web address])    High-Fiber Diet     What Is Fiber?   Dietary fiber is a form of carbohydrate found in plants that cannot be digested by humans. All plants contain fiber, including fruits, vegetables, grains, and legumes. Fiber is often classified into two categories: soluble and insoluble.   Soluble fiber draws water into the bowel and can help slow digestion. Examples of foods that are high in soluble fiber include oatmeal, oat bran, barley, legumes (eg, beans and peas), apples, and strawberries.   Insoluble fiber speeds digestion and can add bulk to the stool. Examples of foods that are high in insoluble fiber include whole-wheat products, wheat bran, cauliflower, green beans, and potatoes.   Why Follow a High-Fiber Diet?   A high-fiber diet is often recommended to prevent and  treat constipation , hemorrhoids , diverticulitis , and irritable bowel syndrome . Eating a high-fiber diet can also help improve your cholesterol levels, lower your risk of coronary heart disease , reduce your risk of type 2 diabetes , and lower your weight. For people with type 1 or 2 diabetes, a high-fiber diet can also help stabilize blood sugar levels.   How Much Fiber Should I Eat?   A high-fiber diet should contain  20-35 grams  of fiber a day. This is actually the amount recommended for the general adult population; however, most Americans eat only 15 grams of fiber per day.   Digestion of Fiber   Eating a higher fiber diet than usual can take some getting used to by your body's digestive system. To avoid the side effects of sudden  increases in dietary fiber (eg, gas, cramping, bloating, and diarrhea), increase fiber gradually and be sure to drink plenty of fluids every day.   Tips for Increasing Fiber Intake   Whenever possible, choose whole grains over refined grains (eg, brown rice instead of white rice, whole-wheat bread instead of white bread).    Include a variety of grains in your diet, such as wheat, rye, barley, oats, quinoa, and bulgur.    Eat more vegetarian-based meals. Here are some ideas: black bean burgers, eggplant lasagna, and veggie tofu stir-fry.    Choose high-fiber snacks, such as fruits, popcorn, whole-grain crackers, and nuts.    Make whole-grain cereal or whole-grain toast part of your daily breakfast regime.    When eating out, whether ordering a sandwich or dinner, ask for extra vegetables.    When baking, replace part of the white flour with whole-wheat flour. Whole-wheat flour is particularly easy to incorporate into a recipe.    High-Fiber Diet Eating Guide   Food Category   Foods Recommended   Notes   Grains   Whole-grain breads, muffins, bagels, or pita bread Rye bread Whole-wheat crackers or crisp breads Whole-grain or bran cereals Oatmeal, oat bran, or grits Wheat germ Whole-wheat pasta and brown rice   Read the ingredients list on food labels. Look for products that list "whole" as the first ingredient (eg, whole-wheat, whole oats). Choose cereals with at least 2 grams of fiber per serving.   Vegetables   All vegetables, especially asparagus, bean sprouts, broccoli, Brussels sprouts, cabbage, carrots, cauliflower, celery, corn, greens, green beans, green pepper, onions, peas, potatoes (with skin), snow peas, spinach, squash, sweet potatoes, tomatoes, zucchini   For maximum fiber intake, eat the peels of fruits and vegetablesjust be sure to wash them well first.   Fruits   All fruits, especially apples, berries, grapefruits, mangoes, nectarines, oranges, peaches, pears, dried fruits (figs, dates, prunes,  raisins)   Choose raw fruits and vegetables over juice, cooked, or cannedraw fruit has more fiber. Dried fruit is also a good source of fiber.   Milk   With the exception of yogurt containing inulin (a type of fiber), dairy foods provide little fiber.   Add more fiber by topping your yogurt or cottage cheese with fresh fruit, whole grain or bran cereals, nuts, or seeds.   Meats and Beans   All beans and peas, especially Garbanzo beans, kidney beans, lentils, lima beans, split peas, and pinto beans All nuts and seeds, especially almonds, peanuts, [place] nuts, cashews, peanut butter, walnuts, sesame and sunflower seeds All meat, poultry, fish, and eggs   Increase fiber in meat dishes by adding pinto beans, kidney beans, black-eyed peas, bran,  or oatmeal. If you are following a low-fat diet, use nuts and seeds only in moderation.   Fats and Oils   All in moderation   Fats and oils do not provide fiber   Snacks, Sweets, and Condiments   Fruit Nuts Popcorn, whole-wheat pretzels, or trail mix made with dried fruits, nuts, and seeds Cakes, breads, and cookies made with oatmeal or whole-wheat flour   Most snack foods do not provide much fiber. Choose snacks with at least 2 grams of fiber per serving.     Last Reviewed: March 2011 Suzanne Boron, MS, MPH, RD   Updated: 02/24/2010       Safer Sex: After Your Visit  Your Care Instructions  Safer sex is a way to reduce your risk of getting an infection spread through sex. It can also help prevent pregnancy. Most infections that are spread through sex, also called sexually transmitted infections or STIs, can be cured. But some can decrease your chances of getting pregnant if they are not treated early. Others, such as herpes, have no cure. And some, such as HIV, can be deadly.  Several products can help you practice safer sex and reduce your chance of STIs. One of the best is a condom. There are condoms for men and for women. The male condom is a tube of soft plastic with a closed  end that is placed deep into the vagina. You can use a special rubber sheet (dental dam) for protection during oral sex. Latex gloves can keep your hands from touching blood, semen, or other body fluids that can carry infections.  Remember that birth control methods such as diaphragms, IUDs, foams, and birth control pills do not stop you from getting STIs.  Follow-up care is a key part of your treatment and safety. Be sure to make and go to all appointments, and call your doctor if you are having problems. It???s also a good idea to know your test results and keep a list of the medicines you take.  How can you care for yourself at home?  Think about getting shots to prevent hepatitis A and hepatitis B. These two diseases can be spread through sex. You also can get hepatitis A if you eat infected food.  Use condoms or male condoms each time and every time you have sex.  Learn the right way to use a male condom:  Condoms come in several sizes. Make sure you use the right size. A condom that is too small can break easily. A condom that is too big can slip off during sex. Use a new condom each time you have sex.  Be careful not to poke a hole in the condom when you open the wrapper.  Squeeze the tip of the condom to keep out air.  Pull down the loose skin (foreskin) from the head of an uncircumcised penis.  While squeezing the tip of the condom, unroll it all the way down to the base of the firm penis.  Never use petroleum jelly (such as Vaseline), grease, hand lotion, baby oil, or anything with oil in it. These products can make holes in the condom.  After sex, hold the condom on your penis as you remove your penis from your partner. This will keep semen from spilling out of the condom.  Learn to use a male condom:  You can put in a male condom up to 8 hours before sex.  Squeeze the smaller ring at the closed end and insert it  deep into the vagina. The larger ring at the open end should stay outside the  vagina.  During sex, make sure the penis goes into the condom.  After the penis is removed, close the open end of the condom by twisting it. Remove the condom.  Do not use a male condom and male condom at the same time.  Do not have sex with anyone who has symptoms of an STI, such as sores on the genitals or mouth. The herpes virus that causes cold sores can spread to and from the penis and vagina.  Do not drink a lot of alcohol or use drugs before sex. This can cause you to let down your guard and not practice safer sex.  Having one sex partner (who does not have STIs and does not have sex with anyone else) is a sure way to avoid STIs.  Talk to your partner before you have sex. Find out if he or she has or is at risk for any STI. Keep in mind that a person may be able to spread an STI even if he or she does not have symptoms. You and your partner may want to get an HIV test. You should get tested again 6 months later.    ?? 2006-2012 Health[employer], Incorporated. Care instructions adapted under license by [facility]. This care instruction is for use with your licensed healthcare professional. If you have questions about a medical condition or this instruction, always ask your healthcare professional. [place] any warranty or liability for your use of this information.  Content Version: 9.4.94723; Last Revised: December 17, 2010                Keep Your Memory Hervey Ard       Many factors can affect your ability to remembera hectic lifestyle, aging, stress, chronic disease, and certain medicines. But, there are steps you can take to sharpen your mind and help preserve your memory.   Challenge Your Brain   Regularly challenging your mind may help keeps it in top shape. Good mental exercises include:   Crossword puzzlesUse a dictionary if you need it; you will learn more that way.   Brainteasers Try some!   Crafts, such as wood working and Financial risk analyst, such as gardening and  Research officer, political party old friends or join groups to meet new ones.   Reading   Learning a new language   Taking a class, whether it be art history or tai chi   TravelingExperience the food, history, and culture of your destination   Learning to use a computer   Going to museums, the theater, or thought-provoking movies   Changing things in your daily life, such as reversing your pattern in the grocery store or brushing your teeth using your nondominant hand   Use Memory Aids   There is no need to remember every detail on your own. These memory aids can help:   Calendars and day planners   Electronic organizers to store all sorts of helpful informationThese devices can "beep" to remind you of appointments.   A book of days to record birthdays, anniversaries, and other occasions that occur on the same date every year   Detailed "to-do" lists and strategically placed sticky notes   Quick "study" sessionsBefore a gathering, review who will be there so their names will be fresh in your mind.   Establish routinesFor example, keep your keys, wallet, and umbrella in the  same place all the time or take medicine with your 8:00 AM glass of juice   Live a Healthy Life   Many actions that will keep your body strong will do the same for your mind. For example:   Talk to Your Doctor About Herbs and Supplements    Malnutrition and vitamin deficiencies can impair your mental function. For example, vitamin B12 deficiency can cause a range of symptoms, including confusion. But, what if your nutritional needs are being met? Can herbs and supplements still offer a benefit? Researchers have investigated a range of natural remedies, such as ginkgo , ginseng , and the supplement phosphatidylserine (PS). So far, though, the evidence is inconsistent as to whether these products can improve memory or thinking.   If you are interested in taking herbs and supplements, talk to your doctor first because they may interact  with other medicines that you are taking.   Exercise Regularly    Among the many benefits of regular exercise are increased blood flow to the brain and decreased risk of certain diseases that can interfere with memory function. One study found that even moderate exercise has a beneficial effect. Examples of "moderate" exercise include:   Playing 18 holes of golf once a week, without a cart   Playing tennis twice a week   Walking one mile per day   Manage Stress    It can be tough to remember what is important when your mind is cluttered. Make time for relaxation. Choose activities that calm you down, and make it routine.   Manage Chronic Conditions    Side effects of high blood pressure , diabetes, and heart disease can interfere with mental function. Many of the lifestyle steps discussed here can help manage these conditions. Strive to eat a healthy diet, exercise regularly, get stress under control, and follow your doctor's advice for your condition.   Minimize Medications    Talk to your doctor about the medicines that you take. Some may be unnecessary. Also, healthy lifestyle habits may lower the need for certain drugs.     Last Reviewed: April 2010 Lajuana Carry, MD   Updated: 03/11/2009          Advance Care Planning: Care Instructions  Your Care Instructions     It can be hard to live with an illness that cannot be cured. But if your health is getting worse, you may want to make decisions about end-of-life care. Planning for the end of your life does not mean that you are giving up. It is a way to make sure that your wishes are met. Clearly stating your wishes can make it easier for your loved ones. Making plans while you are still able may also ease your mind and make your final days less stressful and more meaningful.  Follow-up care is a key part of your treatment and safety. Be sure to make and go to all appointments, and call your doctor if you are having problems. It's also a good idea to know your test  results and keep a list of the medicines you take.  What can you do to plan for the end of life?  You can bring these issues up with your doctor. You do not need to wait until your doctor starts the conversation. You might start with, "What makes life worth living for me is. . ." And then follow it with, "I would not be willing to live with . . ." When you complete this  sentence it helps your doctor understand your wishes.  Talk openly and honestly with your doctor. This is the best way to understand the decisions you will need to make as your health changes. Know that you can always change your mind.  Ask your doctor about commonly used life-support measures. These include tube feedings, breathing machines, and fluids given through a vein (IV). Understanding these treatments will help you decide whether you want them.  You may choose to have these life-supporting treatments for a limited time. This allows a trial period to see whether they will help you. You may also decide that you want your doctor to take only certain measures to keep you alive. It may help to think about the big picture, like what makes life worth living for you or what your values and goals are.  Talk to your doctor about how long you are likely to live. Your doctor may be able to give you an idea of what usually happens with your specific illness.  Think about preparing papers that state your wishes. These papers are called advance directives. If you do this early and review them often, there will not be any confusion about what you want. You can change your instructions at any time.  Which papers should you prepare?  Advance directives are legal papers that tell doctors how you want to be cared for at the end of your life. You do not need a lawyer to write these papers. Ask your doctor or your state health department for information on how to write your advance directives. They may have the forms for each of these types of papers. Make sure  your doctor has a copy of these on file, and give a copy to a family member or close friend.  Consider a do-not-resuscitate order (DNR). This order asks that no extra treatments be done if your heart stops or you stop breathing. Extra treatments may include cardiopulmonary resuscitation (CPR), electrical shock to restart your heart, or a machine to breathe for you. If you decide to have a DNR order, ask your doctor to explain and write it. Place the order in your home where everyone can easily see it.  Consider a living will. A living will explains your wishes about life support and other treatments at the end of your life if you become unable to speak for yourself. Living wills tell doctors to use or not use treatments that would keep you alive. You must have one or two witnesses or a notary present when you sign this form. A living will may be called something else in your state.  Consider a medical power of attorney. This form allows you to name a person to make decisions about your care if you are not able to. Most people ask a close friend or family member. Talk to this person about the kinds of treatments you want and those that you do not want. Make sure this person understands your wishes. A medical power of attorney may be called something else in your state.  These legal papers are simple to change. Tell your doctor what you want to change, and ask him or her to make a note in your medical file. Give your family updated copies of the papers.  Where can you learn more?  Go to [email or web address] and sign in to your MyChart account. Enter P184 in the [place] box to learn more about "Advance Care Planning: Care Instructions."     If you  do not have an account, please click on the "Sign Up Now" link.  Current as of: September 03, 2020??????????????????????????????Content Version: 13.4  ?? 2006-2022 Healthwise, Incorporated.   Care instructions adapted under license by [facility]. If you  have questions about a medical condition or this instruction, always ask your healthcare professional. [place] any warranty or liability for your use of this information.           Learning About Living Eugenie Birks  What is a living will?     A living will, also called a declaration, is a legal form. It tells your family and your doctor your wishes when you can't speak for yourself. It's used by the health professionals who will treat you as you near the end of your life or if you get seriously hurt or ill.  If you put your wishes in writing, your loved ones and others will know what kind of care you want. They won't need to guess. This can ease your mind and be helpful to others. And you can change or cancel your living will at any time.  A living will is not the same as an estate or property will. An estate will explains what you want to happen with your money and property after you die.  How do you use it?  Keep these facts in mind about how a living will is used.  Your living will is used only if you can't speak or make decisions for yourself. Most often, one or more doctors must certify that you can't speak or decide for yourself before your living will takes effect.  If you get better and can speak for yourself again, you can accept or refuse any treatment. It doesn't matter what you said in your living will.  Some states may limit your right to refuse treatment in certain cases. For example, you may need to clearly state in your living will that you don't want artificial hydration and nutrition, such as being fed through a tube.  Is a living will a legal document?  A living will is a legal document. Each state has its own laws about living wills. And a living will may be called something else in your state.  Here are some things to know about living wills.  You don't need an attorney to complete a living will. But legal advice can be helpful if your state's laws are unclear. It can also  help if your health history is complicated or your family can't agree on what should be in your living will.  You can change your living will at any time. Some people find that their wishes about end-of-life care change as their health changes. If you make big changes to your living will, complete a new form.  If you move to another state, make sure that your living will is legal in the state where you now live. In most cases, doctors will respect your wishes even if you have a form from a different state.  You might use a universal form that has been approved by many states. This kind of form can sometimes be filled out and stored online. Your digital copy will then be available wherever you have a connection to the internet. The doctors and nurses who need to treat you can find it right away.  Your state may offer an online registry. This is another place where you can store your living will online.  It's a good idea to get  your living will notarized. This means using a person called a notary public to watch two people sign, or witness, your living will.  What should you know when you create a living will?  Here are some questions to ask yourself as you make your living will.  Do you know enough about life support methods that might be used? If not, talk to your doctor so you know what might be done if you can't breathe on your own, your heart stops, or you can't swallow.  What things would you still want to be able to do after you receive life-support methods? Would you want to be able to walk? To speak? To eat on your own? To live without the help of machines?  Do you want certain religious practices performed if you become very ill?  If you have a choice, where do you want to be cared for? In your home? At a hospital or nursing home?  If you have a choice at the end of your life, where would you prefer to die? At home? In a hospital or nursing home? Somewhere else?  Would you prefer to be buried or  cremated?  Do you want your organs to be donated after you die?  What should you do with your living will?  Make sure that your family members and your health care agent have copies of your living will (also called a declaration).  Give your doctor a copy of your living will. Ask to have it kept as part of your medical record. If you have more than one doctor, make sure that each one has a copy.  Put a copy of your living will where it can be easily found. For example, some people may put a copy on their refrigerator door. If you are using a digital copy, be sure your doctor, family members, and health care agent know how to find and access it.  Where can you learn more?  Go to [email or web address] and sign in to your MyChart account. Enter [phone] in the [place] box to learn more about "[place]."     If you do not have an account, please click on the "Sign Up Now" link.  Current as of: May 14, 2021??????????????????????????????Content Version: 13.4  ?? 2006-2022 Healthwise, Incorporated.   Care instructions adapted under license by [facility]. If you have questions about a medical condition or this instruction, always ask your healthcare professional. [place] any warranty or liability for your use of this information.           Learning About Medical Power of Attorney  What is a medical power of attorney?     A medical power of attorney, also called a durable power of attorney for health care, is one type of the legal forms called advance directives. It lets you name the person you want to make treatment decisions for you if you can't speak or decide for yourself. The person you choose is called your health care agent. This person is also called a health care proxy or health care surrogate.  A medical power of attorney may be called something else in your state.  How do you choose a health care agent?  Choose your health care agent carefully. This  person may or may not be a family member.  Talk to the person before you make your final decision. Make sure he or she is comfortable with this responsibility.  It's a good idea  to choose someone who:  Is at least 49 years old.  Knows you well and understands what makes life meaningful for you.  Understands your religious and moral values.  Will do what you want, not what he or she wants.  Will be able to make difficult choices at a stressful time.  Will be able to refuse or stop treatment, if that is what you would want, even if you could die.  Will be firm and confident with health professionals if needed.  Will ask questions to get needed information.  Lives near you or agrees to travel to you if needed.  Your family may help you make medical decisions while you can still be part of that process. But it's important to choose one person to be your health care agent in case you aren't able to make decisions for yourself.  If you don't fill out the legal form and name a health care agent, the decisions your family can make may be limited.  A health care agent may be called something else in your state.  Who will make decisions for you if you don't have a health care agent?  If you don't have a health care agent or a living will, you may not get the care you want. Decisions may be made by family members who disagree about your medical care. Or decisions may be made by a medical professional who doesn't know you well. In some cases, a judge makes the decisions.  When you name a health care agent, it is very clear who has the power to make health decisions for you.  How do you name a health care agent?  You name your health care agent on a legal form. This form is usually called a medical power of attorney. Ask your hospital, state bar association, or office on aging where to find these forms.  You must sign the form to make it legal. Some states require you to get the form notarized. This means that a person called a  notary public watches you sign the form and then he or she signs the form. Some states also require that two or more witnesses sign the form.  Be sure to tell your family members and doctors who your health care agent is.  Where can you learn more?  Go to [email or web address] and sign in to your MyChart account. Enter P737 in the [place] box to learn more about "[place]."     If you do not have an account, please click on the "Sign Up Now" link.  Current as of: September 03, 2020??????????????????????????????Content Version: 13.4  ?? 2006-2022 Healthwise, Incorporated.   Care instructions adapted under license by [facility]. If you have questions about a medical condition or this instruction, always ask your healthcare professional. [place] any warranty or liability for your use of this information.

## 2021-08-25 NOTE — Assessment & Plan Note (Signed)
Patient comes in for wellness exam   we discussed age appropriate USPSTF screens  Over 75% of the visit was spent counselling patient on appropriate lifestyle choices.

## 2021-08-26 ENCOUNTER — Encounter

## 2021-08-26 LAB — COMPREHENSIVE METABOLIC PANEL
ALT: 21 U/L (ref 10–40)
AST: 31 U/L (ref 15–37)
Albumin/Globulin Ratio: 1.3 (ref 1.1–2.2)
Albumin: 4.4 g/dL (ref 3.4–5.0)
Alkaline Phosphatase: 102 U/L (ref 40–129)
Anion Gap: 15 (ref 3–16)
BUN: 12 mg/dL (ref 7–20)
CO2: 25 mmol/L (ref 21–32)
Calcium: 9.5 mg/dL (ref 8.3–10.6)
Chloride: 101 mmol/L (ref 99–110)
Creatinine: 1 mg/dL (ref 0.9–1.3)
GFR African American: >60 (ref >60)
GFR Non-African American: >60 (ref >60)
Glucose: 94 mg/dL (ref 70–99)
Potassium: 4 mmol/L (ref 3.5–5.1)
Sodium: 141 mmol/L (ref 136–145)
Total Bilirubin: 0.4 mg/dL (ref 0.0–1.0)
Total Protein: 7.9 g/dL (ref 6.4–8.2)

## 2021-08-26 LAB — CBC WITH AUTO DIFFERENTIAL
Basophils %: 0.8 %
Basophils Absolute: 0 10*3/uL (ref 0.0–0.2)
Eosinophils %: 0.6 %
Eosinophils Absolute: 0 10*3/uL (ref 0.0–0.6)
Hematocrit: 39.2 % — ABNORMAL LOW (ref 40.5–52.5)
Hemoglobin: 12.7 g/dL — ABNORMAL LOW (ref 13.5–17.5)
Lymphocytes %: 20.1 %
Lymphocytes Absolute: 0.8 10*3/uL — ABNORMAL LOW (ref 1.0–5.1)
MCH: 27.2 pg (ref 26.0–34.0)
MCHC: 32.3 g/dL (ref 31.0–36.0)
MCV: 84.2 fL (ref 80.0–100.0)
MPV: 9.3 fL (ref 5.0–10.5)
Monocytes %: 10.2 %
Monocytes Absolute: 0.4 10*3/uL (ref 0.0–1.3)
Neutrophils %: 68.3 %
Neutrophils Absolute: 2.6 10*3/uL (ref 1.7–7.7)
Platelets: 212 10*3/uL (ref 135–450)
RBC: 4.66 M/uL (ref 4.20–5.90)
RDW: 13.9 % (ref 12.4–15.4)
WBC: 3.8 10*3/uL — ABNORMAL LOW (ref 4.0–11.0)

## 2021-08-26 LAB — LIPID PANEL
Cholesterol, Total: 178 mg/dL (ref 0–199)
HDL: 59 mg/dL (ref 40–60)
LDL Calculated: 111 mg/dL — ABNORMAL HIGH (ref <100)
Triglycerides: 41 mg/dL (ref 0–150)
VLDL Cholesterol Calculated: 8 mg/dL

## 2021-08-26 LAB — VITAMIN D 25 HYDROXY: Vit D, 25-Hydroxy: 31.6 ng/mL (ref >=30)

## 2021-08-26 LAB — HEPATITIS C ANTIBODY: Hep C Ab Interp: NONREACTIVE

## 2021-08-27 LAB — HEMOGLOBIN A1C
Hemoglobin A1C: 5.6 %
eAG: 114 mg/dL

## 2021-08-27 LAB — HIV SCREEN
HIV ANTIGEN: NONREACTIVE
HIV Ag/Ab: NONREACTIVE
HIV-1 Antibody: NONREACTIVE
HIV-2 Ab: NONREACTIVE

## 2021-08-27 LAB — PSA SCREENING: PSA: 1.61 ng/mL (ref 0.00–4.00)

## 2021-08-28 ENCOUNTER — Encounter

## 2021-08-28 NOTE — Other (Signed)
Please let patient know that their bad cholesterol (LDL) is 111.  They should be under 100 his 10 year cardiovascular disease risk score ??(Which is the % risk of having a cardiac event in the next 10 years) is ??4.9%.  Patient could treat his LDL with low-fat diet an exercise.    Patient hemoglobin was a little bit low we will order a FIT test.  His white count was a little low but this could be monitored

## 2022-06-22 ENCOUNTER — Inpatient Hospital Stay: Admit: 2022-06-23 | Payer: PRIVATE HEALTH INSURANCE | Primary: Internal Medicine

## 2022-06-22 ENCOUNTER — Emergency Department: Admit: 2022-06-22 | Payer: PRIVATE HEALTH INSURANCE | Primary: Internal Medicine

## 2022-06-22 ENCOUNTER — Ambulatory Visit
Admit: 2022-06-22 | Discharge: 2022-06-22 | Payer: PRIVATE HEALTH INSURANCE | Attending: Internal Medicine | Primary: Internal Medicine

## 2022-06-22 ENCOUNTER — Inpatient Hospital Stay
Admit: 2022-06-22 | Discharge: 2022-06-23 | Disposition: A | Discharge status: Home / Self Care | Payer: PRIVATE HEALTH INSURANCE | Admitting: Internal Medicine

## 2022-06-22 DIAGNOSIS — R1900 Intra-abdominal and pelvic swelling, mass and lump, unspecified site: Secondary | ICD-10-CM

## 2022-06-22 DIAGNOSIS — R198 Other specified symptoms and signs involving the digestive system and abdomen: Secondary | ICD-10-CM

## 2022-06-22 DIAGNOSIS — C480 Malignant neoplasm of retroperitoneum: Principal | ICD-10-CM

## 2022-06-22 LAB — MICROSCOPIC URINALYSIS
Bacteria, UA: NONE SEEN /HPF
Epithelial Cells, UA: 0 /HPF (ref 0–5)
Hyaline Casts, UA: 2 /LPF (ref 0–8)
RBC, UA: 1 /HPF (ref 0–4)
WBC, UA: 1 /HPF (ref 0–5)

## 2022-06-22 LAB — COMPREHENSIVE METABOLIC PANEL W/ REFLEX TO MG FOR LOW K
ALT: 15 U/L (ref 10–40)
AST: 18 U/L (ref 15–37)
Albumin/Globulin Ratio: 0.7 — ABNORMAL LOW (ref 1.1–2.2)
Albumin: 3.4 g/dL (ref 3.4–5.0)
Alkaline Phosphatase: 93 U/L (ref 40–129)
Anion Gap: 8 (ref 3–16)
BUN: 8 mg/dL (ref 7–20)
CO2: 27 mmol/L (ref 21–32)
Calcium: 9.2 mg/dL (ref 8.3–10.6)
Chloride: 101 mmol/L (ref 99–110)
Creatinine: 0.7 mg/dL — ABNORMAL LOW (ref 0.9–1.3)
Est, Glom Filt Rate: >60 (ref >60)
Glucose: 98 mg/dL (ref 70–99)
Potassium reflex Magnesium: 4.8 mmol/L (ref 3.5–5.1)
Sodium: 136 mmol/L (ref 136–145)
Total Bilirubin: 0.4 mg/dL (ref 0.0–1.0)
Total Protein: 8.2 g/dL (ref 6.4–8.2)

## 2022-06-22 LAB — URINALYSIS WITH REFLEX TO CULTURE
Bilirubin Urine: NEGATIVE
Blood, Urine: NEGATIVE
Glucose, Ur: NEGATIVE mg/dL
Leukocyte Esterase, Urine: NEGATIVE
Nitrite, Urine: NEGATIVE
Specific Gravity, UA: 1.025 (ref 1.005–1.030)
Urobilinogen, Urine: 1 E.U./dL (ref <2.0)
pH, UA: 6 (ref 5.0–8.0)

## 2022-06-22 LAB — CBC WITH AUTO DIFFERENTIAL
Basophils %: 0.4 %
Basophils Absolute: 0 10*3/uL (ref 0.0–0.2)
Eosinophils %: 2.8 %
Eosinophils Absolute: 0.1 10*3/uL (ref 0.0–0.6)
Hematocrit: 33.7 % — ABNORMAL LOW (ref 40.5–52.5)
Hemoglobin: 10.6 g/dL — ABNORMAL LOW (ref 13.5–17.5)
Lymphocytes %: 14 %
Lymphocytes Absolute: 0.6 10*3/uL — ABNORMAL LOW (ref 1.0–5.1)
MCH: 25.3 pg — ABNORMAL LOW (ref 26.0–34.0)
MCHC: 31.6 g/dL (ref 31.0–36.0)
MCV: 80.1 fL (ref 80.0–100.0)
MPV: 8.4 fL (ref 5.0–10.5)
Monocytes %: 7.9 %
Monocytes Absolute: 0.3 10*3/uL (ref 0.0–1.3)
Neutrophils %: 74.9 %
Neutrophils Absolute: 3.1 10*3/uL (ref 1.7–7.7)
Platelets: 289 10*3/uL (ref 135–450)
RBC: 4.21 M/uL (ref 4.20–5.90)
RDW: 14.6 % (ref 12.4–15.4)
WBC: 4.2 10*3/uL (ref 4.0–11.0)

## 2022-06-22 LAB — PROTIME-INR
INR: 1.1 (ref 0.84–1.16)
Protime: 14.2 s (ref 11.5–14.8)

## 2022-06-22 LAB — LIPASE: Lipase: 18 U/L (ref 13.0–60.0)

## 2022-06-22 MED ORDER — ENOXAPARIN SODIUM 40 MG/0.4ML IJ SOSY
40 MG/0.4ML | Freq: Every day | INTRAMUSCULAR | Status: AC
Start: 2022-06-22 — End: 2022-06-23
  Administered 2022-06-22: 20:00:00 40 mg via SUBCUTANEOUS

## 2022-06-22 MED ORDER — NORMAL SALINE FLUSH 0.9 % IV SOLN
0.9 % | Freq: Two times a day (BID) | INTRAVENOUS | Status: AC
Start: 2022-06-22 — End: 2022-06-23
  Administered 2022-06-23 (×2): 10 mL via INTRAVENOUS

## 2022-06-22 MED ORDER — ACETAMINOPHEN 650 MG RE SUPP
650 | Freq: Four times a day (QID) | RECTAL | Status: DC | PRN
Start: 2022-06-22 — End: 2022-06-23

## 2022-06-22 MED ORDER — SODIUM CHLORIDE 0.9 % IV SOLN
0.9 % | INTRAVENOUS | Status: AC | PRN
Start: 2022-06-22 — End: 2022-06-23

## 2022-06-22 MED ORDER — ACETAMINOPHEN 325 MG PO TABS
325 | Freq: Four times a day (QID) | ORAL | Status: DC | PRN
Start: 2022-06-22 — End: 2022-06-23

## 2022-06-22 MED ORDER — POLYETHYLENE GLYCOL 3350 17 G PO PACK
17 g | Freq: Every day | ORAL | Status: AC | PRN
Start: 2022-06-22 — End: 2022-06-23

## 2022-06-22 MED ORDER — ONDANSETRON HCL 4 MG/2ML IJ SOLN
4 MG/2ML | Freq: Four times a day (QID) | INTRAMUSCULAR | Status: AC | PRN
Start: 2022-06-22 — End: 2022-06-23

## 2022-06-22 MED ORDER — ONDANSETRON 4 MG PO TBDP
4 MG | Freq: Three times a day (TID) | ORAL | Status: AC | PRN
Start: 2022-06-22 — End: 2022-06-23

## 2022-06-22 MED ORDER — IOPAMIDOL 76 % IV SOLN
76 % | Freq: Once | INTRAVENOUS | Status: AC | PRN
Start: 2022-06-22 — End: 2022-06-22
  Administered 2022-06-22: 15:00:00 75 mL via INTRAVENOUS

## 2022-06-22 MED ORDER — NORMAL SALINE FLUSH 0.9 % IV SOLN
0.9 % | INTRAVENOUS | Status: AC | PRN
Start: 2022-06-22 — End: 2022-06-23

## 2022-06-22 MED ORDER — LACTATED RINGERS IV SOLN
INTRAVENOUS | Status: AC
Start: 2022-06-22 — End: 2022-06-23
  Administered 2022-06-22 – 2022-06-23 (×2): via INTRAVENOUS

## 2022-06-22 MED FILL — LOVENOX 40 MG/0.4ML IJ SOSY: 40 MG/0.4ML | INTRAMUSCULAR | Qty: 0.4

## 2022-06-22 NOTE — Consults (Signed)
[facility]   CONSULT NOTE    06/22/2022 9:03 PM    Patient Information: Trevor Crosby   Date of Admit:  06/22/2022  Primary Care Physician:  Michaela Corner. Gilford Rile, MD  Requesting Physician:  Solon Augusta, MD    Reason for consult:    Hematology/oncology consulted for large retroperitoneal mass    Chief complaint:    Hematology/oncology consulted for large retroperitoneal mass     History of Present Illness:    51 year old male with no significant past medical history who presents to the hospital for progressively worsening abdominal distention and bloating.  The patient states that this has been ongoing for at least 6 months but his wife at bedside noted that his abdomen has been getting more and more distended over the last 2 years.  Upon presenting to the hospital he underwent a CT scan of the abdomen and pelvis which showed a large mass in the right retroperitoneum surrounding the right kidney measuring 30 centimeters in maximal diameter for which hematology/oncology was consulted     Past Medical History:     has no past medical history on file.         Past Surgical History:    Past Surgical History:   Procedure Laterality Date    COLONOSCOPY  2016 probably    Most likely -No polyps    FACIAL COSMETIC SURGERY      fx facial bone ; fracture with basketball injury            Current Medications:     sodium chloride flush  5-40 mL IntraVENous 2 times per day    enoxaparin  40 mg SubCUTAneous Daily           Allergies:    No Known Allergies     Social History:    reports that he has never smoked. He has never used smokeless tobacco. He reports current alcohol use of about 2.0 standard drinks per week. He reports that he does not use drugs.         Family History:     family history includes Colon Cancer in his mother; High Blood Pressure in his father.         ROS:  14 point review of systems performed negative except for as documented in the HPI        PHYSICAL EXAM:        Vitals:    06/22/22 1624    BP: (!) 140/96   Pulse: 76   Resp: 16   Temp: 98.7 F (37.1 C)   SpO2: 98%          CONSTITUTIONAL: awake, alert, cooperative, no apparent distress     EYES: No Conjunctivitis, No icterus    ENT: Normocephalic, atraumatic, No external ear deformities    NECK: No palpable adenopathy    HEMATOLOGIC/LYMPHATIC: no cervical, supraclavicular or axillary lymphadenopathy     LUNGS:  clear to auscultation no added sounds    CARDIOVASCULAR: regular rate and rhythm, normal S1 and S2, no murmur noted    ABDOMEN: Distended palpable hard mass     MUSCULOSKELETAL: full range of motion noted, tone is normal    NEUROLOGIC: AAO X 3, No gross focal neurological deficits    EXTREMITIES: no LE edema             DATA:  CBC:   Lab Results   Component Value Date/Time    WBC 4.2 06/22/2022 10:52 AM    RBC  4.21 06/22/2022 10:52 AM    HGB 10.6 06/22/2022 10:52 AM    HCT 33.7 06/22/2022 10:52 AM    MCV 80.1 06/22/2022 10:52 AM    MCH 25.3 06/22/2022 10:52 AM    MCHC 31.6 06/22/2022 10:52 AM    RDW 14.6 06/22/2022 10:52 AM    PLT 289 06/22/2022 10:52 AM    MPV 8.4 06/22/2022 10:52 AM     BMP:  Lab Results   Component Value Date/Time    NA 136 06/22/2022 10:52 AM    K 4.8 06/22/2022 10:52 AM    CL 101 06/22/2022 10:52 AM    CO2 27 06/22/2022 10:52 AM    BUN 8 06/22/2022 10:52 AM    CREATININE 0.7 06/22/2022 10:52 AM    CALCIUM 9.2 06/22/2022 10:52 AM    GFRAA >60 08/26/2021 11:06 AM    LABGLOM >60 06/22/2022 10:52 AM    GLUCOSE 98 06/22/2022 10:52 AM     Magnesium: No results found for: MG  LIVER PROFILE:   Recent Labs     06/22/22  1052   AST 18   ALT 15   LIPASE 18.0   BILITOT 0.4   ALKPHOS 93     PT/INR:    Lab Results   Component Value Date/Time    PROTIME 14.2 06/22/2022 10:52 AM    INR 1.10 06/22/2022 10:52 AM           IMPRESSION/RECOMMENDATIONS:      51 year old male with no significant past medical history who presents to the hospital for progressively worsening abdominal distention.      #Large retroperitoneal mass highly concerning  for malignancy  -Ongoing abdominal distention for the past 6 months  -Wife at bedside states that she notices husband's abdomen getting progressively more distended over the last 2 years  -CT scan of the abdomen and pelvis showed large right-sided retroperitoneal mass measuring 30 cm in maximal diameter  -CT-guided biopsy  -CT of the chest, bone scan to complete staging  -Follow-up on above results      Maudie Mercury MD  [facility]

## 2022-06-22 NOTE — H&P (Signed)
HOSPITALISTS HISTORY AND PHYSICAL    06/22/2022 2:40 PM    Patient Information:  Trevor Crosby is a 51 y.o. male [id number]  PCP:  Dwayne O. Gilford Rile, MD (Tel: [phone] )    Chief complaint:    Chief Complaint   Patient presents with    Abdominal Pain     Bloating & distention with weight loss, since February.       History of Present Illness:  Trevor Crosby is a 51 y.o. male who complains of bloating and abdominal distention since February has had 15 pound weight loss unintentional over the last 6 months does have some reflux.  No nausea vomiting no constipation.  Patient does have a mother with a history of colon cancer at 40 patient has had colonoscopies which were negative.  Patient does not smoke or drink.      REVIEW OF SYSTEMS:   Constitutional: Negative for fever,chills or night sweats  ENT: Negative for rhinorrhea, epistaxis, hoarseness, sore throat.  Respiratory: Negative for shortness of breath,wheezing  Cardiovascular: Negative for chest pain, palpitations   Gastrointestinal: see above  Genitourinary: Negative for polyuria, dysuria   Hematologic/Lymphatic: Negative for bleeding tendency, easy bruising  Musculoskeletal: Negative for myalgias and arthralgias  Neurologic: Negative for confusion,dysarthria.  Skin: Negative for itching,rash  Psychiatric: Negative for depression,anxiety, agitation.  Endocrine: Negative for polydipsia,polyuria,heat /cold intolerance.    Past Medical History:  denies  Past Surgical History:   has a past surgical history that includes Facial cosmetic surgery and Colonoscopy (2016 probably).     Medications:  No current facility-administered medications on file prior to encounter.     Current Outpatient Medications on File Prior to Encounter   Medication Sig Dispense Refill    Multiple Vitamin (ONE-A-DAY MENS) TABS Take 1 tablet by mouth daily         Allergies:  No Known Allergies      Social History:  Patient Lives a thome   reports that he has never smoked. He has never used smokeless tobacco. He reports current alcohol use of about 2.0 standard drinks per week. He reports that he does not use drugs.     Family History:  family history includes Colon Cancer in his mother; High Blood Pressure in his father. , at home    Physical Exam:  BP (!) 130/91   Pulse 85   Temp 98.4 F (36.9 C)   Resp 15   Wt 159 lb (72.1 kg)   SpO2 100%   BMI 23.14 kg/m     General appearance:  Appears comfortable. AAOx3  HEENT: atraumatic, Pupils equal, muscous membranes moist, no masses appreciated  Cardiovascular: Regular rate and rhythm no murmurs appreciated  Respiratory: CTAB no wheezing  Gastrointestinal: Abdomen + distention BS_+ NT  EXT: no edema  Neurology: no gross focal deficts  Psychiatry: Appropriate affect. Not agitated  Skin: Warm, dry, no rashes appreciated    Labs:  CBC:   Lab Results   Component Value Date/Time    WBC 4.2 06/22/2022 10:52 AM    RBC 4.21 06/22/2022 10:52 AM    HGB 10.6 06/22/2022 10:52 AM    HCT 33.7 06/22/2022 10:52 AM    MCV 80.1 06/22/2022 10:52 AM    MCH 25.3 06/22/2022 10:52 AM    MCHC 31.6 06/22/2022 10:52 AM    RDW 14.6 06/22/2022 10:52 AM    PLT 289 06/22/2022 10:52 AM    MPV 8.4 06/22/2022 10:52 AM     BMP:    Lab  Results   Component Value Date/Time    NA 136 06/22/2022 10:52 AM    K 4.8 06/22/2022 10:52 AM    CL 101 06/22/2022 10:52 AM    CO2 27 06/22/2022 10:52 AM    BUN 8 06/22/2022 10:52 AM    CREATININE 0.7 06/22/2022 10:52 AM    CALCIUM 9.2 06/22/2022 10:52 AM    GFRAA >60 08/26/2021 11:06 AM    LABGLOM >60 06/22/2022 10:52 AM    GLUCOSE 98 06/22/2022 10:52 AM     CT ABDOMEN PELVIS W IV CONTRAST Additional Contrast? None   Final Result   There is a the large mass noted in the the right retroperitoneum, surrounding   the right kidney.  It measures approximately 28 x 19 x 30 cm. It has both   soft tissue components and fatty components, in keeping with a  liposarcoma.   No evidence of metastatic disease.      Cysts noted in the liver.             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 *        Assessment/Plan:   Large right retroperitoneum mass: reviewed ct  - onc consult  - wil llikely need biopsy    Anemia check iron b12 folate      DVT prophylaxis lovenox  Code status full code        Admit as inpatient I anticipate hospitalization spanning more than two midnights for investigation and treatment of the above medically necessary diagnoses.    Please note that some part of this chart was generated using [employer]. Although every effort was made to ensure the accuracy of this automated transcription, some errors in transcription may have occurred inadvertently. If you may need any clarification, please do not hesitate to contact me through [facility].       Solon Augusta, MD    06/22/2022 2:40 PM

## 2022-06-22 NOTE — ED Notes (Signed)
Report given to Almyra Free RN at this time, all questions answered during handoff report, VSS at this time.     Ronnald Collum, RN  06/22/22 1441

## 2022-06-22 NOTE — ED Provider Notes (Incomplete)
In addition to the advanced practice provider, I personally saw Jammal Kham and performed a substantive portion of the visit including all aspects of the medical decision making.    Briefly, this is a 51 y.o. male here for ***.    On exam, ***    EKG  EKG was reviewed by emergency department physician in the absence of a cardiologist    Narrow complex sinus rhythm, rate ***, normal axis, normal PR and QRS intervals, normal Qtc, no ST elevations or depressions, normal t-wave morphology, impression NSR, no STEMI      Screenings   Glasgow Coma Scale  Eye Opening: Spontaneous  Best Verbal Response: Oriented  Best Motor Response: Obeys commands  Glasgow Coma Scale Score: 15      Is this patient to be included in the SEP-1 Core Measure due to severe sepsis or septic shock?   {Sep1YesOrNo:39718}      MDM  ***    I Dr. Lina Sar am the primary clinician of record.    Critical Care:  ***    Patient Referrals:  No follow-up provider specified.    Discharge Medications:  Current Discharge Medication List          FINAL IMPRESSION  1. Retroperitoneal mass        Blood pressure (!) 140/96, pulse 76, temperature 98.7 F (37.1 C), temperature source Oral, resp. rate 16, weight 159 lb (72.1 kg), SpO2 98 %.     For further details of [facility] emergency department encounter, please see documentation by advanced practice provider, Paris Lore, PA.    Valetta Fuller, DO (electronically signed)  Attending Emergency Physician

## 2022-06-22 NOTE — Progress Notes (Signed)
Trevor Crosby is 51 y.o. male who p/w Bloated, Heartburn, and Leg Pain    51 year old male with history of progressive abdominal distention.  Patient said over the last few months he has noted that his abdomen has become more protuberant.  Patient also mentions that his veins on his abdominal wall have been more prominent.  This is associated with heart burn symptoms, patient also complaining of weight loss of about 13 pounds since 9/22.  Patient mentions he drinks a couple beers on the weekends with friends, denies alcohol dependency.  Denies yellowing of his eyes or skin.  Denies fever/chills/nausea/vomiting.  Denies coffee-ground emesis emesis or hematemesis.  Patient will like to know what the next step is and treatment options are.       The problem and medicine lists and chart were reviewed in detail.      Review of Systems   Constitutional:  Negative for activity change, appetite change, chills, fatigue, fever and unexpected weight change.   HENT:  Negative for congestion, ear pain, postnasal drip, sinus pressure, sore throat, tinnitus and trouble swallowing.    Eyes:  Negative for pain and redness.   Respiratory:  Negative for cough, chest tightness, shortness of breath and wheezing.    Cardiovascular:  Negative for chest pain, palpitations and leg swelling.   Gastrointestinal:  Positive for abdominal distention. Negative for abdominal pain, blood in stool, constipation, diarrhea, nausea and vomiting.   Endocrine: Negative for cold intolerance, heat intolerance and polydipsia.   Genitourinary:  Negative for decreased urine volume, dysuria, flank pain, frequency, hematuria and urgency.   Musculoskeletal:  Negative for arthralgias, back pain, joint swelling, neck pain and neck stiffness.   Skin:  Negative for color change and rash.   Neurological:  Negative for dizziness, weakness, numbness and headaches.   Hematological:  Negative for adenopathy.   Psychiatric/Behavioral:  Negative for behavioral  problems, sleep disturbance and suicidal ideas. The patient is not nervous/anxious.       BP (!) 114/58 (Site: Left Upper Arm, Position: Sitting, Cuff Size: Medium Adult)   Pulse 84   Resp 14   Ht 5' 9.5" (1.765 m)   Wt 159 lb 12.8 oz (72.5 kg)   SpO2 98%   BMI 23.26 kg/m    Physical Exam  Constitutional:       General: He is not in acute distress.     Appearance: Normal appearance. He is not ill-appearing.   HENT:      Head: Normocephalic and atraumatic.      Right Ear: Ear canal normal.      Left Ear: Ear canal normal.      Mouth/Throat:      Mouth: Mucous membranes are moist.      Pharynx: No oropharyngeal exudate or posterior oropharyngeal erythema.   Eyes:      Extraocular Movements: Extraocular movements intact.      Conjunctiva/sclera: Conjunctivae normal.      Pupils: Pupils are equal, round, and reactive to light.   Neck:      Vascular: No carotid bruit.   Cardiovascular:      Rate and Rhythm: Normal rate and regular rhythm.      Pulses: Normal pulses.      Heart sounds: Normal heart sounds. No murmur heard.    No gallop.   Pulmonary:      Effort: Pulmonary effort is normal.      Breath sounds: Normal breath sounds. No wheezing, rhonchi or rales.   Abdominal:  General: Abdomen is flat. There is distension.      Palpations: There is mass.      Tenderness: There is no abdominal tenderness. There is no guarding.      Comments: Protuberant abdomen with prominent veins to abdominal wall.  Abdomen firm, ?Organomegaly versus mass.  Difficult to perceive ascites   Musculoskeletal:         General: No swelling or tenderness. Normal range of motion.      Cervical back: No tenderness.   Lymphadenopathy:      Cervical: No cervical adenopathy.   Skin:     Findings: No erythema, lesion or rash.   Neurological:      General: No focal deficit present.      Mental Status: He is alert and oriented to person, place, and time. Mental status is at baseline.      Cranial Nerves: No cranial nerve deficit.      Motor:  No weakness.   Psychiatric:         Mood and Affect: Mood normal.         Behavior: Behavior normal.         Thought Content: Thought content normal.         Judgment: Judgment normal.        ASSESSMENT/PLAN:  1. Protuberant abdomen  Assessment & Plan:   Patient referred to [facility] ED for further assessment  Given his presentation  I believe this warrants hospital work-up  Patient will need CT abdomen/pelvis imaging and lab work  Patient will likely be admitted depending on the findings but will defer to [facility] [place] to give report on the patient and let them know that he will be arriving via private vehicle      Return in about 2 weeks (around 07/06/2022) for Hospital follow-up.     This note was generated in part or in whole with voice recognition software.  Voice recognition is usually quite accurate but there are transcription errors that can often occur.  All attempts were made to correct these errors I apologized for any  typographical errors that were not detected and corrected.     Electronically signed by Michaela Corner. Gilford Rile, MD on 06/22/2022 at 10:00 AM.

## 2022-06-22 NOTE — ED Provider Notes (Cosign Needed)
[facility]  EMERGENCY DEPARTMENT ENCOUNTER        Pt Name: Trevor Crosby  MRN: [id number]  Birthdate [birth year]  Date of evaluation: 06/22/2022  Provider: Dalene Carrow, PA-C  PCP: Montine Circle Dan Humphreys, MD  Note Started: 11:19 AM EDT 06/22/22       I have seen and evaluated this patient with my supervising physician PENDL, JOSHUA D.      CHIEF COMPLAINT       Chief Complaint   Patient presents with    Abdominal Pain     Bloating & distention with weight loss, since February.         HISTORY OF PRESENT ILLNESS: 1 or more Elements     History From: patient  Limitations to history : None    Trevor Crosby is a 51 y.o. male who presents to the emergency department with a chief complaint of abdominal bloating and distention.  He began noticing this in February and is gradually gotten worse.  He states he occasionally gets some discomfort in his upper abdomen after he eats or when he is bending over or doing push-ups but denies any pain just sitting there at this time.  Denies vomiting, chest pain, shortness of breath, fevers, dysuria, hematuria, diarrhea or bloody stool.  He states he has been having more frequent smaller bowel movements.  He does have some family history in his mother of colorectal cancer but he had a colonoscopy actually last September which she states was unremarkable and he denies any personal history of malignancy.  Denies any ongoing medical issues.  Denies smoking or illicit drug use.  He states he does have a couple beers weekly but denies overuse of alcohol.    Nursing Notes were all reviewed and agreed with or any disagreements were addressed in the HPI.    REVIEW OF SYSTEMS :      Review of Systems    Positives and Pertinent negatives as per HPI.     SURGICAL HISTORY     Past Surgical History:   Procedure Laterality Date    COLONOSCOPY  2016 probably    Most likely -No polyps    FACIAL COSMETIC SURGERY      fx facial bone ; fracture with basketball injury       CURRENTMEDICATIONS        Current Discharge Medication List        CONTINUE these medications which have NOT CHANGED    Details   Multiple Vitamin (ONE-A-DAY MENS) TABS Take 1 tablet by mouth daily             ALLERGIES     Patient has no known allergies.    FAMILYHISTORY       Family History   Problem Relation Age of Onset    Colon Cancer Mother     High Blood Pressure Father         SOCIAL HISTORY       Social History     Tobacco Use    Smoking status: Never    Smokeless tobacco: Never   Vaping Use    Vaping Use: Never used   Substance Use Topics    Alcohol use: Yes     Alcohol/week: 2.0 standard drinks     Types: 2 Cans of beer per week    Drug use: Never       SCREENINGS        Glasgow Coma Scale  Eye Opening: Spontaneous  Best  Verbal Response: Oriented  Best Motor Response: Obeys commands  Glasgow Coma Scale Score: 15                CIWA Assessment  BP: (!) 140/96  Pulse: 76           PHYSICAL EXAM  1 or more Elements     ED Triage Vitals   BP Temp Temp src Pulse Respirations SpO2 Height Weight - Scale   06/22/22 1033 06/22/22 1031 -- 06/22/22 1031 06/22/22 1031 06/22/22 1031 -- 06/22/22 1031   (!) 143/95 98.4 F (36.9 C)  88 18 100 %  159 lb (72.1 kg)       Physical Exam  Vitals and nursing note reviewed.   Constitutional:       Appearance: He is well-developed. He is not diaphoretic.   HENT:      Head: Atraumatic.      Nose: Nose normal.   Eyes:      General:         Right eye: No discharge.         Left eye: No discharge.   Cardiovascular:      Rate and Rhythm: Normal rate and regular rhythm.      Heart sounds: No murmur heard.    No friction rub. No gallop.   Pulmonary:      Effort: Pulmonary effort is normal. No respiratory distress.      Breath sounds: No stridor. No wheezing, rhonchi or rales.   Abdominal:      General: Bowel sounds are normal. There is distension.      Palpations: Abdomen is soft. There is no mass.      Tenderness: There is no abdominal tenderness. There is no guarding or rebound.      Hernia: No hernia is  present.      Comments: Large amount of distention noted throughout the abdomen without guarding, rebound or rigidity.   Musculoskeletal:         General: No swelling. Normal range of motion.      Cervical back: Normal range of motion.   Skin:     General: Skin is warm and dry.      Findings: No erythema or rash.   Neurological:      Mental Status: He is alert and oriented to person, place, and time.      Cranial Nerves: No cranial nerve deficit.   Psychiatric:         Behavior: Behavior normal.           DIAGNOSTIC RESULTS   LABS:    Labs Reviewed   CBC WITH AUTO DIFFERENTIAL - Abnormal; Notable for the following components:       Result Value    Hemoglobin 10.6 (*)     Hematocrit 33.7 (*)     MCH 25.3 (*)     Lymphocytes Absolute 0.6 (*)     All other components within normal limits   COMPREHENSIVE METABOLIC PANEL W/ REFLEX TO MG FOR LOW K - Abnormal; Notable for the following components:    Creatinine 0.7 (*)     Albumin/Globulin Ratio 0.7 (*)     All other components within normal limits   URINALYSIS WITH REFLEX TO CULTURE - Abnormal; Notable for the following components:    Ketones, Urine TRACE (*)     Protein, UA TRACE (*)     All other components within normal limits   LIPASE   PROTIME-INR   MICROSCOPIC URINALYSIS  IRON AND TIBC   VITAMIN B12 & FOLATE       When ordered only abnormal lab results are displayed. All other labs were within normal range or not returned as of this dictation.    EKG: When ordered, EKG's are interpreted by the Emergency Department Physician in the absence of a cardiologist.  Please see their note for interpretation of EKG.    RADIOLOGY:   Non-plain film images such as CT, Ultrasound and MRI are read by the radiologist. Plain radiographic images are visualized and preliminarily interpreted by the ED Provider with the below findings:    CT imaging reviewed by myself and interpreted by radiologist reveals a large right-sided mass that is displacing the right kidney.    Interpretation  per the Radiologist below, if available at the time of this note:    CT ABDOMEN PELVIS W IV CONTRAST Additional Contrast? None   Final Result   There is a the large mass noted in the the right retroperitoneum, surrounding   the right kidney.  It measures approximately 28 x 19 x 30 cm. 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   No evidence of metastatic disease.      Cysts noted in the liver.           No results found.    No results found.    PROCEDURES   Unless otherwise noted below, none     Procedures    CRITICAL CARE TIME (.cctime)       PAST MEDICAL HISTORY      has no past medical history on file.     EMERGENCY DEPARTMENT COURSE and DIFFERENTIAL DIAGNOSIS/MDM:   Vitals:    Vitals:    06/22/22 1430 06/22/22 1530 06/22/22 1543 06/22/22 1624   BP: (!) 130/91 (!) 141/87  (!) 140/96   Pulse: 85 81  76   Resp: 15 16 16 16    Temp:  98.4 F (36.9 C)  98.7 F (37.1 C)   TempSrc:  Oral  Oral   SpO2: 100% 98% 98% 98%   Weight:           Patient was given the following medications:  Medications   lactated ringers IV soln infusion ( IntraVENous New Bag 06/22/22 1544)   sodium chloride flush 0.9 % injection 5-40 mL (has no administration in time range)   sodium chloride flush 0.9 % injection 5-40 mL (has no administration in time range)   0.9 % sodium chloride infusion (has no administration in time range)   enoxaparin (LOVENOX) injection 40 mg (40 mg SubCUTAneous Given 06/22/22 1544)   ondansetron (ZOFRAN-ODT) disintegrating tablet 4 mg (has no administration in time range)     Or   ondansetron (ZOFRAN) injection 4 mg (has no administration in time range)   polyethylene glycol (GLYCOLAX) packet 17 g (has no administration in time range)   acetaminophen (TYLENOL) tablet 650 mg (has no administration in time range)     Or   acetaminophen (TYLENOL) suppository 650 mg (has no administration in time range)   iopamidol (ISOVUE-370) 76 % injection 75 mL (75 mLs IntraVENous Given  06/22/22 1128)             Is this patient to be included in the SEP-1 Core Measure due to severe sepsis or septic shock?   No   Exclusion criteria - the patient is NOT to be included for SEP-1 Core Measure due to:  Infection is not suspected    Chronic Conditions affecting care:    has no past medical history on file.    CONSULTS: (Who and What was discussed)  IP CONSULT TO HEM/ONC  IP CONSULT TO HOSPITALIST  Dr. Johny Blamer - Heme/Onc  Dr. Vincente Poli -hospitalist    Social Determinants Significantly Affecting Health : None    Records Reviewed (External and Source)     CC/HPI Summary, DDx, ED Course, and Reassessment: Patient presented with abdominal bloating.  Occasionally has some discomfort just with eating and certain movements.  Abdominal exam is benign besides a large amount of distention noted.  He states he has been having smaller bowel movements.  Does not take any medication on a daily basis.  Is any history of overuse of alcohol or illicit drug use.  He has a large retroperitoneal mass that is actually shifting and displacing his right kidney and renal artery.  We discussed this with hematology oncology who would like sampling performed first.  Low suspicion for obstruction or perforated viscus.  Given the size of this and patient not having good follow-up discussed with hospitalist for admission.  Patient was stable at time of admission.    Disposition Considerations (tests considered but not done, Admit vs D/C, Shared Decision Making, Pt Expectation of Test or Tx.):        I am the Primary Clinician of Record.  FINAL IMPRESSION      1. Retroperitoneal mass          DISPOSITION/PLAN     DISPOSITION Admitted 06/22/2022 02:04:55 PM      PATIENT REFERRED TO:  No follow-up provider specified.    DISCHARGE MEDICATIONS:  Current Discharge Medication List          DISCONTINUED MEDICATIONS:  Current Discharge Medication List                 (Please note that portions of this note were completed with a voice recognition  program.  Efforts were made to edit the dictations but occasionally words are mis-transcribed.)    Dalene Carrow, PA-C (electronically signed)        Dalene Carrow, PA-C  06/22/22 1648

## 2022-06-22 NOTE — Progress Notes (Cosign Needed)
Pharmacy Home Medication Reconciliation Note    A medication reconciliation has been completed for Trevor Crosby 02-Aug-1971    Pharmacy: [facility] [address] [place], [place]  Information provided by: patient    The patient's home medication list is as follows:  No current facility-administered medications on file prior to encounter.     Current Outpatient Medications on File Prior to Encounter   Medication Sig Dispense Refill    Multiple Vitamin (ONE-A-DAY MENS) TABS Take 1 tablet by mouth daily         Of note, patient takes no prescription meds or OTCs currently other than a men's multivitamin.    Timing of last doses updated.    Thank you,  Judeth Cornfield A. Nereida Schepp, CPhT

## 2022-06-22 NOTE — ED Notes (Signed)
ED TO INPATIENT SBAR HANDOFF    Patient Name: Trevor Crosby   DOB:  03-05-1971  51 y.o.   MRN:  [id number]  Preferred Name  [facility]  ED Room #:  ED-0010/10  Family/Caregiver Present no   Restraints no   Sitter no   Sepsis Risk Score Sepsis Risk Score: 1.01    Situation  Code Status: No Order No additional code details.    Allergies: Patient has no known allergies.  Weight: Patient Vitals for the past 96 hrs (Last 3 readings):   Weight   06/22/22 1031 159 lb (72.1 kg)     Arrived from: home  Chief Complaint:   Chief Complaint   Patient presents with    Abdominal Pain     Bloating & distention with weight loss, since February.       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 *    Imaging:   CT ABDOMEN PELVIS W IV CONTRAST Additional Contrast? None   Final Result   There is a the large mass noted in the the right retroperitoneum, surrounding   the right kidney.  It measures approximately 28 x 19 x 30 cm. 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   No evidence of metastatic disease.      Cysts noted in the liver.           Abnormal labs:   Abnormal Labs Reviewed   CBC WITH AUTO DIFFERENTIAL - Abnormal; Notable for the following components:       Result Value    Hemoglobin 10.6 (*)     Hematocrit 33.7 (*)     MCH 25.3 (*)     Lymphocytes Absolute 0.6 (*)     All other components within normal limits   COMPREHENSIVE METABOLIC PANEL W/ REFLEX TO MG FOR LOW K - Abnormal; Notable for the following components:    Creatinine 0.7 (*)     Albumin/Globulin Ratio 0.7 (*)     All other components within normal limits   URINALYSIS WITH REFLEX TO CULTURE - Abnormal; Notable for the following components:    Ketones, Urine TRACE (*)     Protein, UA TRACE (*)     All other components within normal limits     Critical values: no     Abnormal Assessment Findings: Abnormal large abdomen, distended and  hard.    Background  History: History reviewed. No pertinent past medical history.    Assessment    Vitals/MEWS:    Level of Consciousness: Alert (0)   Vitals:    06/22/22 1300 06/22/22 1315 06/22/22 1400 06/22/22 1430   BP: (!) 137/96 134/89 (!) 137/94 (!) 130/91   Pulse: 88 87 91 85   Resp: '22 22 18 15   '$ Temp:       SpO2: 100% 100% 100% 100%   Weight:         FiO2 (%): none.  O2 Flow Rate: O2 Device: None (Room air)    Cardiac Rhythm: Cardiac Rhythm: Sinus rhythm  Pain Assessment: 0-10 '[x]'$  Verbal '[]'$  Rodena Goldmann Scale  Pain Scale: Pain Assessment  Pain Assessment: 0-10  Pain Level: 0  Patient's Stated Pain Goal: 4  Last documented pain score (0-10 scale) Pain Level: 0  Last documented pain medication administered: none.  Mental Status: oriented, alert, coherent, logical, thought processes intact, and able to concentrate  and follow conversation  Orientation Level: Orientation Level: Oriented X4  NIH Score:    C-SSRS: Risk of Suicide: No Risk  Bedside swallow:    Glasgow Coma Scale (GCS): Glasgow Coma Scale  Eye Opening: Spontaneous  Best Verbal Response: Oriented  Best Motor Response: Obeys commands  Glasgow Coma Scale Score: 15  Active LDA's:   Peripheral IV 06/22/22 Right Antecubital (Active)     PO Status: Regular  Pertinent or High Risk Medications/Drips: no   If Yes, please provide details: none.  Pending Blood Product Administration: no       You may also review the ED PT Care Timeline found under the Summary Nursing Index tab.    Recommendation    Pending orders None.  Plan for Discharge (if known): home.  Additional Comments: none.   If any further questions, please call Sending RN at [phone].    Electronically signed by: Electronically signed by Ronnald Collum, RN on 06/22/2022 at 2:37 PM       Ronnald Collum, RN  06/22/22 1440

## 2022-06-22 NOTE — Assessment & Plan Note (Addendum)
Patient referred to [facility] ED for further assessment  Given his presentation  I believe this warrants hospital work-up  Patient will need CT abdomen/pelvis imaging and lab work  Patient will likely be admitted depending on the findings but will defer to [facility] [place] to give report on the patient and let them know that he will be arriving via private vehicle

## 2022-06-22 NOTE — ACP (Advance Care Planning) (Signed)
Advanced Care Planning Note.    Purpose of Encounter: Advanced care planning in light of hospitalization  Parties In Attendance: Patient,    Decisional Capacity: Yes  Subjective: Patient  understand that this conversation is to address long term care goal  Objective: Admitted to hospital with large right retroperitoneal mass  Goals of Care Determination: Patient would pursue CPR and Intubation if required. No tracheostomy or long term ventilation if required.     Code Status: full code  Time spent on Advanced care Planning: 17 minutes  Advanced Care Planning Documents: documented patient's wishes, would like Sister [name] Lett to make medical decisions if unable to make decisions    Solon Augusta, MD  06/22/2022 2:43 PM

## 2022-06-22 NOTE — Progress Notes (Signed)
Pt off the unit to CT.

## 2022-06-23 ENCOUNTER — Inpatient Hospital Stay: Admit: 2022-06-23 | Payer: PRIVATE HEALTH INSURANCE | Primary: Internal Medicine

## 2022-06-23 LAB — CBC WITH AUTO DIFFERENTIAL
Basophils %: 1 %
Basophils Absolute: 0 10*3/uL (ref 0.0–0.2)
Eosinophils %: 5.1 %
Eosinophils Absolute: 0.2 10*3/uL (ref 0.0–0.6)
Hematocrit: 32 % — ABNORMAL LOW (ref 40.5–52.5)
Hemoglobin: 10.3 g/dL — ABNORMAL LOW (ref 13.5–17.5)
Lymphocytes %: 18.4 %
Lymphocytes Absolute: 0.7 10*3/uL — ABNORMAL LOW (ref 1.0–5.1)
MCH: 25.5 pg — ABNORMAL LOW (ref 26.0–34.0)
MCHC: 32.3 g/dL (ref 31.0–36.0)
MCV: 79 fL — ABNORMAL LOW (ref 80.0–100.0)
MPV: 8.4 fL (ref 5.0–10.5)
Monocytes %: 9.7 %
Monocytes Absolute: 0.3 10*3/uL (ref 0.0–1.3)
Neutrophils %: 65.8 %
Neutrophils Absolute: 2.4 10*3/uL (ref 1.7–7.7)
Platelets: 281 10*3/uL (ref 135–450)
RBC: 4.05 M/uL — ABNORMAL LOW (ref 4.20–5.90)
RDW: 14.4 % (ref 12.4–15.4)
WBC: 3.6 10*3/uL — ABNORMAL LOW (ref 4.0–11.0)

## 2022-06-23 LAB — BASIC METABOLIC PANEL W/ REFLEX TO MG FOR LOW K
Anion Gap: 8 (ref 3–16)
BUN: 9 mg/dL (ref 7–20)
CO2: 28 mmol/L (ref 21–32)
Calcium: 9.2 mg/dL (ref 8.3–10.6)
Chloride: 102 mmol/L (ref 99–110)
Creatinine: 0.7 mg/dL — ABNORMAL LOW (ref 0.9–1.3)
Est, Glom Filt Rate: >60 (ref >60)
Glucose: 102 mg/dL — ABNORMAL HIGH (ref 70–99)
Potassium reflex Magnesium: 3.9 mmol/L (ref 3.5–5.1)
Sodium: 138 mmol/L (ref 136–145)

## 2022-06-23 LAB — IRON AND TIBC
Iron Saturation: 16 % — ABNORMAL LOW (ref 20–50)
Iron: 33 ug/dL — ABNORMAL LOW (ref 59–158)
TIBC: 208 ug/dL — ABNORMAL LOW (ref 260–445)

## 2022-06-23 LAB — VITAMIN B12 & FOLATE
Folate: 14.29 ng/mL (ref 4.78–24.20)
Vitamin B-12: 482 pg/mL (ref 211–911)

## 2022-06-23 MED ORDER — MIDAZOLAM HCL 5 MG/ML IJ SOLN
5 MG/ML | INTRAMUSCULAR | Status: AC
Start: 2022-06-23 — End: ?

## 2022-06-23 MED ORDER — IOPAMIDOL 76 % IV SOLN
76 % | Freq: Once | INTRAVENOUS | Status: AC | PRN
Start: 2022-06-23 — End: 2022-06-22
  Administered 2022-06-23: 01:00:00 75 mL via INTRAVENOUS

## 2022-06-23 MED ORDER — NORMAL SALINE FLUSH 0.9 % IV SOLN
0.9 % | INTRAVENOUS | Status: DC | PRN
Start: 2022-06-23 — End: 2022-06-23
  Administered 2022-06-23: 13:00:00 10 mL via INTRAVENOUS

## 2022-06-23 MED ORDER — FENTANYL CITRATE (PF) 100 MCG/2ML IJ SOLN
100 MCG/2ML | INTRAMUSCULAR | Status: AC
Start: 2022-06-23 — End: ?

## 2022-06-23 MED ORDER — SODIUM CHLORIDE 0.9 % IV SOLN
0.9 % | INTRAVENOUS | Status: AC
Start: 2022-06-23 — End: ?

## 2022-06-23 MED ORDER — TECHNETIUM TC 99M MEDRONATE IV KIT
Freq: Once | INTRAVENOUS | Status: AC | PRN
Start: 2022-06-23 — End: 2022-06-23
  Administered 2022-06-23: 13:00:00 25.38 via INTRAVENOUS

## 2022-06-23 MED FILL — SODIUM CHLORIDE 0.9 % IV SOLN: 0.9 % | INTRAVENOUS | Qty: 500

## 2022-06-23 MED FILL — FENTANYL CITRATE (PF) 100 MCG/2ML IJ SOLN: 100 MCG/2ML | INTRAMUSCULAR | Qty: 2

## 2022-06-23 MED FILL — MIDAZOLAM HCL 5 MG/ML IJ SOLN: 5 MG/ML | INTRAMUSCULAR | Qty: 1

## 2022-06-23 NOTE — Discharge Summary (Signed)
Hospital Medicine Discharge Summary    Patient: Trevor Crosby     Gender: male  DOB: 01-03-1971   Age: 51 y.o.  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 Dennison Nancy, will discharge and fu with him in the office for results and further work up    Discharge Exam:    BP (!) 144/83   Pulse 94   Temp 97.8 F (36.6 C) (Oral)   Resp 16   Ht '5\' 9"'$  (1.753 m)   Wt 159 lb (72.1 kg)   SpO2 97%   BMI 23.48 kg/m   General appearance:  Appears comfortable. AAOx3  HEENT: atraumatic, Pupils equal, muscous membranes moist, no masses appreciated  Cardiovascular: Regular rate and rhythm no murmurs appreciated  Respiratory: CTAB no wheezing  Gastrointestinal: Abdomen + distention BS+ NT  EXT: no edema  Neurology: no gross focal deficts  Psychiatry: Appropriate affect. Not agitated  Skin: Warm, dry, no rashes appreciated    Discharge Medications:   Current Discharge Medication List        Current Discharge Medication List        Current Discharge Medication List        CONTINUE these medications which have NOT CHANGED    Details   Multiple Vitamin (ONE-A-DAY MENS) TABS Take 1 tablet by mouth daily           Current Discharge Medication List          Labs: For convenience and continuity at follow-up the following most recent labs are provided:    Lab Results   Component Value Date/Time    WBC 3.6 06/23/2022 07:03 AM    HGB 10.3 06/23/2022 07:03 AM    HCT 32.0 06/23/2022 07:03 AM    MCV 79.0 06/23/2022 07:03 AM    PLT 281 06/23/2022 07:03 AM    NA 138 06/23/2022 07:03 AM    K 3.9 06/23/2022 07:03 AM    CL 102 06/23/2022 07:03 AM    CO2 28  06/23/2022 07:03 AM    BUN 9 06/23/2022 07:03 AM    CREATININE 0.7 06/23/2022 07:03 AM    CALCIUM 9.2 06/23/2022 07:03 AM    ALKPHOS 93 06/22/2022 10:52 AM    ALT 15 06/22/2022 10:52 AM    AST 18 06/22/2022 10:52 AM    BILITOT 0.4 06/22/2022 10:52 AM    LABALBU 3.4 06/22/2022 10:52 AM    LDLCALC 111 08/26/2021 11:06 AM    TRIG 41 08/26/2021 11:06 AM     Lab Results   Component Value Date    INR 1.10 06/22/2022       Radiology:  CT CHEST W CONTRAST    Result Date: 06/22/2022  EXAMINATION: CT OF THE CHEST WITH CONTRAST 06/22/2022 8:46 pm TECHNIQUE: CT of the chest was performed with the administration of intravenous contrast. Multiplanar reformatted images are provided for review. Automated exposure control, iterative reconstruction, and/or weight based adjustment of the mA/kV was utilized to reduce the radiation dose to as low as reasonably achievable. COMPARISON: None.  HISTORY: ORDERING SYSTEM PROVIDED HISTORY: cancer staging TECHNOLOGIST PROVIDED HISTORY: Reason for exam:->cancer staging Reason for Exam: Cancer staging. FINDINGS: Mediastinum: No enlarged lymphadenopathy.  Small lymph nodes are scattered. No esophageal thickening. Heart: Heart size is normal. No pericardial effusion. Great vessels: the great vessels are patent and unremarkable. Aorta: Aorta is patent and unremarkable.  Negative for aneurysm or dissection. Lungs/pleura: No visualized lung nodules.  Minimal atelectasis in the medial aspect of the right lower lobe.  The trachea and bronchi are patent. Soft Tissues: Discussed on the CT abdomen performed today Osseous: No suspicious lytic or sclerotic lesions.     Negative for metastases     CT GUIDED NEEDLE PLACEMENT    Result Date: 06/23/2022  PROCEDURE: CT GUIDED CORE BIOPSY CT GUIDED NDL PLACEMENT MODERATE CONSCIOUS SEDATION 06/23/2022 HISTORY: ORDERING SYSTEM PROVIDED HISTORY: CT Guided biopsy of retroperitoneal mass TECHNOLOGIST PROVIDED HISTORY: Reason for exam:->CT Guided biopsy of retroperitoneal  mass Reason for Exam: Guided biopsy of retroperitoneal mass PHYSICIANS: Benjamin Martinique, M.D. SEDATION: Local anesthesia CONTRAST: None TECHNIQUE: Informed consent was obtained following a detailed explanation of the procedure including risks, benefits, and alternatives. Universal protocol was followed. Sterile gowns, masks, hats and gloves utilized for maximal sterile barrier. A time-out was performed to verify the patient and procedure. A preliminary CT was performed which demonstrated the right retroperitoneal mass.  An access site was selected the area was prepped and draped in sterile fashion. The skin and soft tissues at the access site were anesthetized with lidocaine. Using CT guidance, a 17-gauge coaxial introducer needle was placed to the mass.  An 18-gauge biopsy needle was advanced through the guiding needle and 3 core biopsy sample were obtained. Samples were sent to pathology. Coaxial needle was removed.  Postprocedure images demonstrated no immediate complications at the biopsy site. The patient tolerated the procedure well. Estimated blood loss: <10 ml Dose modulation, iterative reconstruction, and/or weight based adjustment of the mA/kV was utilized to reduce the radiation dose to as low as reasonably achievable. Total exam DLP: 218.07 mGy-cm     Technically successful CT-guided biopsy of the right retroperitoneal mass.     CT ABDOMEN PELVIS W IV CONTRAST Additional Contrast? None    Result Date: 06/22/2022  EXAMINATION: CT OF THE ABDOMEN AND PELVIS WITH CONTRAST 06/22/2022 11:25 am TECHNIQUE: CT of the abdomen and pelvis was performed with the administration of intravenous contrast. Multiplanar reformatted images are provided for review. Automated exposure control, iterative reconstruction, and/or weight based adjustment of the mA/kV was utilized to reduce the radiation dose to as low as reasonably achievable. COMPARISON: None. HISTORY: ORDERING SYSTEM PROVIDED HISTORY: abdominal pain and swelling  TECHNOLOGIST PROVIDED HISTORY: Additional Contrast?->None Reason for exam:->abdominal pain and swelling Decision Support Exception - unselect if not a suspected or confirmed emergency medical condition->Emergency Medical Condition (MA) Reason for Exam: abdominal pain and swelling FINDINGS: Lower Chest: Unremarkable Organs: There is a the large mass noted in the the right retroperitoneum, surrounding the right kidney.  It measures approximately 28 x 19 x 30 cm.  It has both soft tissue components and fatty components, in keeping with a liposarcoma.  Cysts are noted in the liver.  The gallbladder, pancreas and spleen appear normal.  The adrenal glands appear normal.  Left kidney is unremarkable. GI/Bowel: The stomach, small bowel loops and colon appear normal. Pelvis: The bladder is empty.  Prostate gland and rectum are unremarkable. Peritoneum/Retroperitoneum: There is no adenopathy noted. Bones/Soft Tissues: Unremarkable     There is a the large  mass noted in the the right retroperitoneum, surrounding the right kidney.  It measures approximately 28 x 19 x 30 cm. It has both soft tissue components and fatty components, in keeping with a liposarcoma. No evidence of metastatic disease. Cysts noted in the liver.     CT BIOPSY ABDOMEN RETROPERITONEUM    Result Date: 06/23/2022  PROCEDURE: CT GUIDED CORE BIOPSY CT GUIDED NDL PLACEMENT MODERATE CONSCIOUS SEDATION 06/23/2022 HISTORY: ORDERING SYSTEM PROVIDED HISTORY: CT Guided biopsy of retroperitoneal mass TECHNOLOGIST PROVIDED HISTORY: Reason for exam:->CT Guided biopsy of retroperitoneal mass Reason for Exam: Guided biopsy of retroperitoneal mass PHYSICIANS: Benjamin Martinique, M.D. SEDATION: Local anesthesia CONTRAST: None TECHNIQUE: Informed consent was obtained following a detailed explanation of the procedure including risks, benefits, and alternatives. Universal protocol was followed. Sterile gowns, masks, hats and gloves utilized for maximal sterile barrier. A time-out  was performed to verify the patient and procedure. A preliminary CT was performed which demonstrated the right retroperitoneal mass.  An access site was selected the area was prepped and draped in sterile fashion. The skin and soft tissues at the access site were anesthetized with lidocaine. Using CT guidance, a 17-gauge coaxial introducer needle was placed to the mass.  An 18-gauge biopsy needle was advanced through the guiding needle and 3 core biopsy sample were obtained. Samples were sent to pathology. Coaxial needle was removed.  Postprocedure images demonstrated no immediate complications at the biopsy site. The patient tolerated the procedure well. Estimated blood loss: <10 ml Dose modulation, iterative reconstruction, and/or weight based adjustment of the mA/kV was utilized to reduce the radiation dose to as low as reasonably achievable. Total exam DLP: 218.07 mGy-cm     Technically successful CT-guided biopsy of the right retroperitoneal mass.         Signed:    Solon Augusta, MD   06/23/2022

## 2022-06-23 NOTE — Progress Notes (Signed)
Shift assessment complete. Routine VS obtained and stable. Patient resting in bed. Respirations even and easy. Call light in reac No needs expressed at this time. Will continue to monitor.

## 2022-06-23 NOTE — Progress Notes (Signed)
Patient discharged to home. Discharge instructions reviewed with and received by patient. He voiced understanding of instructions. IV's removed without complication and covered with a dry dressing. Patient tolerated well.

## 2022-06-23 NOTE — Brief Op Note (Signed)
Brief Postoperative Note    Trevor Crosby  Date of Birth:  06-30-1971  [id number]    Pre-operative Diagnosis:  Right retroperitoneal/abdominal mass    Post-operative Diagnosis: Same    Procedure: CT Guided Biopsy of retroperitoneal/abdominal mass    Anesthesia: Moderate Sedation    Surgeons/Assistants: Priyal Musquiz Martinique, MD    Estimated Blood Loss: < 14m    Complications: None    Specimens: Tissue from target    Findings: Technically successful CT biopsy of retroperitoneal/abdominal mass. Tissue sent to pathology.    Electronically signed by Jinna Weinman E JMartinique MD on 06/23/2022 at 11:14 AM

## 2022-06-23 NOTE — Plan of Care (Signed)
Problem: Pain  Goal: Verbalizes/displays adequate comfort level or baseline comfort level  Outcome: Progressing

## 2022-06-23 NOTE — Care Coordination-Inpatient (Signed)
Discharge Planning:    Case Manager (CM) reviewed the patient's chart to assess needs. Patient's Readmission Risk Score is 8%. Patient's medical insurance is [facility]. Patient's PCP is WALKER, DWAYNE O. No needs anticipated, at this time. CM team to follow. Staff to inform CM if additional discharge needs arise.     Electronically signed by Rosaura Carpenter on 06/23/2022 at 9:04 AM

## 2022-06-24 NOTE — Telephone Encounter (Signed)
Follow up call attempted. Left message and return call phone number.

## 2022-06-24 NOTE — Telephone Encounter (Signed)
Care Transitions Initial Follow Up Call    Outreach made within 2 business days of discharge: Yes    Patient: Trevor Crosby Patient DOB: 1971-07-06   MRN: [id number]  Reason for Admission: There are no discharge diagnoses documented for the most recent discharge.  Discharge Date: 06/23/22       Spoke with: Festus Barren    Discharge department/facility: [facility]    TCM Interactive Patient Contact:  Was patient able to fill all prescriptions: Yes  Was patient instructed to bring all medications to the follow-up visit: Yes  Is patient taking all medications as directed in the discharge summary? Yes  Does patient understand their discharge instructions: Yes  Does patient have questions or concerns that need addressed prior to 7-14 day follow up office visit: yes - 07/02/2022    Scheduled appointment with PCP within 7-14 days    Follow Up  Future Appointments   Date Time Provider [place]   08/25/2022  3:00 PM Dwayne Ofilia Neas, MD [facility] [place] [facility]       Marlowe Alt, [place]

## 2022-07-02 ENCOUNTER — Ambulatory Visit
Admit: 2022-07-02 | Discharge: 2022-07-02 | Payer: PRIVATE HEALTH INSURANCE | Attending: Internal Medicine | Primary: Internal Medicine

## 2022-07-02 DIAGNOSIS — Z09 Encounter for follow-up examination after completed treatment for conditions other than malignant neoplasm: Secondary | ICD-10-CM

## 2022-07-02 NOTE — Assessment & Plan Note (Signed)
Retroperitoneal mass, biopsies:   - Spindle cell neoplasm.     COMMENT: The differential diagnosis includes atypical lipomatous  tumor/well-differentiated liposarcoma.     Patient has been referred to heme-onc/surgical specialist at [facility]  We will source office notes from Dr. Richrd Humbles heme-onc  Follow-up with specialists as per their orders.

## 2022-07-02 NOTE — Progress Notes (Signed)
Post-Discharge Transitional Care  Follow Up      Trevor Crosby   Date of Birth:  1971/06/13    Date of Office Visit:  07/02/2022  Date of Hospital Admission: 06/22/22  Date of Hospital Discharge: 06/23/22  Risk of hospital readmission (high >=14%. Medium >=10%) :Readmission Risk Score: 7.7      Care management risk score Rising risk (score 2-5) and Complex Care (Scores >=6): No Risk Score On File     Face to face  following discharge  Call initiated 2 business days of discharge: Yes    ASSESSMENT/PLAN:   Hospital discharge follow-up  -     PR DISCHARGE MEDS RECONCILED W/ CURRENT OUTPATIENT MED LIST  Retroperitoneal mass  Assessment & Plan:   Retroperitoneal mass, biopsies:   - Spindle cell neoplasm.     COMMENT: The differential diagnosis includes atypical lipomatous  tumor/well-differentiated liposarcoma.     Patient has been referred to heme-onc/surgical specialist at [facility]  We will source office notes from Dr. Richrd Humbles heme-onc  Follow-up with specialists as per their orders.     Medical Decision Making: low complexity  Return in about 8 weeks (around 08/25/2022) for Annual physical, 15 minutes .    On this date 07/02/2022 I have spent 25 minutes reviewing previous notes, test results and face to face with the patient discussing the diagnosis and importance of compliance with the treatment plan as well as documenting on the day of the visit.       Subjective:   HPI:  Follow up of Hospital problems/diagnosis(es): Retroperitoneal mass    Inpatient course: Discharge summary reviewed- see chart.    Interval history/Current status: 51 year old male presented to the office 06/22/2022 for abdominal distention.  Said he had noticed swelling to abdomen from 6 months prior, associated with weight loss and fatigue.  Patient was admitted at [facility] for imaging and further assessment.  He was noted to have retroperitoneal mass s/p biopsy pathology report showing  - Retroperitoneal mass, biopsies:   - Spindle cell neoplasm.      COMMENT: The differential diagnosis includes atypical lipomatous   tumor/well-differentiated liposarcoma.     Patient saw heme-onc yesterday in the outpatient setting.  As per patient he has been referred to [facility] heme-onc /surgical specialist.  Today patient believes he is at his baseline, denies fever/chills, no shortness of breath with exertion.        Patient Active Problem List   Diagnosis    Chest pain, pleuritic    Protuberant abdomen    Liposarcoma [facility])    Hospital discharge follow-up    Retroperitoneal mass       Medications listed as ordered at the time of discharge from hospital     Medication List            Accurate as of July 02, 2022  9:27 AM. If you have any questions, ask your nurse or doctor.                CONTINUE taking these medications      One-A-Day Mens Tabs                Medications marked "taking" at this time  Outpatient Medications Marked as Taking for the 07/02/22 encounter (Office Visit) with Sundra Aland, MD   Medication Sig Dispense Refill    Multiple Vitamin (ONE-A-DAY MENS) TABS Take 1 tablet by mouth daily          Medications patient taking  as of now reconciled against medications ordered at time of hospital discharge: Yes    A comprehensive review of systems was negative except for what was noted in the HPI.    Objective:    BP 128/80   Pulse 80   Temp 97.9 F (36.6 C) (Temporal)   Ht '5\' 9"'$  (1.753 m)   Wt 162 lb (73.5 kg)   SpO2 98%   BMI 23.92 kg/m     Patient in no acute respiratory distress or painful distress.  Normal appearance, not ill looking    Normocephalic, PERRL, EOM movements intact, no nystagmus.    Mucous membrnes moist and pink. No scleral icterus    Vesicular BS, no wheeze ronchi, crackles or other adventitious sounds    S1S2 regular rate and rhythm, no murmur gallops or rubs    Abdomen protuberant.  Nontender mass palpated.    No signs of edema to legs or feet    Alert and Oriented to time, place and person. No focal signs or  neurological deficits. Muscle power grade 5/5 in major muscle groups of all 4 limbs. Cranial nerves III-XII intact and WNL.     An electronic signature was used to authenticate this note.  --Howie Rufus O. Gilford Rile, MD

## 2022-07-29 ENCOUNTER — Encounter

## 2022-08-03 NOTE — Other (Signed)
Formatting of this note is different from the original.  Procedure Information  Visit type: Phone  Planned Procedure: laparotomy  Date of Planned Procedure:9/12  Surgeon Name: grignol  Anesthesia Type: general  DNR Status: DNR not discussed during anesthesia pre-evaluation     Patient does not have difficulty lying flat  If yes what is the problem?    Nursing Disposition    Privacy Information   What is the best phone # to reach you at between 7am-3pm: [phone]  May leave detailed message regarding surgery details  Ok to leave message with another individual: permission to leave message   Name and relationship to patient: [place] phone number: [phone]  Assessed patient/family's compliance with suggested safety interventions     Comment:    Surgical Transportation  Ride & caregiver name day of procedure: [name]        Relationship to patient: sister      Patient History  Patient has a family doctor        Name:Dwayne Walker    Corticosteroid Use  No steroid use        Patient Surgical History  prior surgeries  Past Surgical History:  No date: VASECTOMY    Anesthetic History  Have you or a family member had a problem with Anesthesia or Surgery?    Took a long time to wake up?    No for patient  The pt or family member did not need to go to the ICU    Have you or a family member had a high fever or malignant hyperthemia?     No for patient    Did someone say that they had a hard time putting in the breathing tube or did you wake up with a VERY BAD sore throat?    No for patient        Airway    Patient does not have a history of severe pain or limited movement of the neck  Patient does not have numbness or tingling in arms or hands when they move their neck  Patient does not have a limited mouth opening or problems opening their mouth    Patient has no TMJ problem       TMJ severity:  PONV: no PONV  PONV Comment:    Sleep Apnea Assessment  Patient has Obstructive Sleep Apnea  Sleep Study  Recommended     Did patient have a sleep study: no sleep study        Sleep study date if applicable:  CPAP Recommended?        CPAP:  Patient snores loudly  Sleep History Comment: Sleep study recommended but not completed    Cardiovascular History  Have you ever had any heart problems? heart problem  not PCP or Cardiologist visit          Patient does not have pacemaker  Patient does not have an AICD  Patient does not have a history of MI or heartache    Patient has: no chest pain       Last time reported chest pain:  Patient historically has: no CHF       If CHF, when and where:  Patient has a history of: no valvular problems/murmurs      If so, what value?        Can you provide any additional details:  Cardiac history comment: Echo ordered for an irregular  HR    Respiratory History  Have you had any lung or breathing problems? no respiratory complications      If you use inhalers, do they help you?    Any triggers related to your asthma?  Do you get short of breath when you lie flat?        How long before you get short of breath?  Respiratory Comment: Able to climb a couple flights of stairs    Diabetic History  Patient has: no diabetes    Patient is:        Patient does: at home        How often:        What are the usual results?    Gastrointestinal History  Patient has no gastrointestinal problem    Gastrointestinal problem comment:  Patient has no difficulty swallowing  Patient has history of frequent: no vomiting  When was the last time you threw up or spit up?  Patient has no heartburn indicated as severe  Patient has a history of no liver disease         Hepatitis, jaundice or other liver disease comment:  Gastrointestinal comment:     Endoscopy Bowel Prep  Have you had poor bowel preparation in the past? No  Do you have a history of constipation? No  Do you take opioids? No    Renal History  Patient has no kidney problem (excluding kidney stones)     Kidney problem comment:        Patient requires         Days and location of dialysis:    Neurological History  Patient has a history of no neurological problem     MS or other neurologic problem comment:  Patient has no difficulty walking        Wheelchair or movement limitation comment:  Patient does not have a history of a stroke or bleeding in the brain    Patient has history of no TIA       TIA comment:  Patient has a history of no seizures      How long ago?        Seizure details:    Hematologic/Vascular History  Patient has no bleeding problem history    Bleeding problem details:        Patient has no sickle cell disease                     Type:  Patient has no blood clot present in their history within the leg or lung       Blood clot in lung or leg details:  Patient has no history of aortic aneurysm or other blood vessel         Aortic aneurysm or other blood vessel comment:    Other History    Patient does not have a history of Trisomy 21  Patient does not have a history of Cerebral Palsy  Patient does not have a history of Dementia  Patient does not have a history of Developmental Delay    Vitals  Wt Readings from Last 3 Encounters:   07/29/22 72.6 kg (160 lb)   07/29/22 74.6 kg (164 lb 8 oz)   07/14/22 75.5 kg (166 lb 6.4 oz)     Ht Readings from Last 3 Encounters:   07/29/22 1.753 m ('5\' 9"'$ )   07/14/22 1.753 m ('5\' 9"'$ )     There is no height or weight  on file to calculate BMI.    Lab  No results found for: "HGBA1C"    Allergies  Patient has no known allergies.    Medications   has a current medication list which includes the following prescription(s): bisacodyl, multiple vitamin, neomycin, and polyethylene glycol.    Problem List  Patient Active Problem List   Diagnosis   ? Surgery, elective   ? Sarcoma of retroperitoneum   ? PVC (premature ventricular contraction)     Smoking/Tobacco  Social History     Tobacco Use   ? Smoking status: Never   ? Smokeless tobacco: Never   Vaping Use   ? Vaping Use: Never used   Substance Use Topics   ? Alcohol use: Yes      Alcohol/week: 3.0 standard drinks of alcohol     Types: 3 Cans of beer per week       Electronically signed by Roosevelt Locks, RN at 08/03/2022  9:49 AM EDT

## 2022-08-06 ENCOUNTER — Ambulatory Visit: Admit: 2022-08-06 | Discharge: 2022-08-06 | Payer: PRIVATE HEALTH INSURANCE | Primary: Internal Medicine

## 2022-08-06 DIAGNOSIS — Z01818 Encounter for other preprocedural examination: Secondary | ICD-10-CM

## 2022-08-06 LAB — ECHOCARDIOGRAM COMPLETE 2D W DOPPLER W COLOR: Left Ventricular Ejection Fraction: 55

## 2022-08-10 NOTE — Op Note (Signed)
Formatting of this note is different from the original.  Trevor Crosby ([id number])    PRE OPERATIVE DIAGNOSIS  Retroperitoneal mass [R19.00]    POST OPERATIVE DIAGNOSIS   Post-Op Diagnosis Codes:     * Retroperitoneal mass [R19.00]     PROCEDURE PERFORMED  Procedure(s) (LRB):  LAPAROTOMY EXPLORATORY (N/A)  CYSTOURETHROSCOPY W/ INSERTION STENT URETER (N/A)  EXCISION/DESTRUCTION INTRA-ABDOMINAL CYST TUMOR OPEN W/ SCALPEL (N/A)  NEPHRECTOMY OPEN (Right)    PRIMARY CLOSURE  N/A    INTRAOPERATIVE FINDINGS  No significant abnormalities        SURGEON  Surgeon(s) and Role:  Panel 1:     * Wilmon Arms, MD - Primary  Panel 2:     * Melodye Ped, MD - Primary    ANESTHESIOLOGIST  Anesthesiologist: Lanney Gins, MD; Georgiann Hahn, MD  CRNA: Elie Goody, APRN-CRNA  Anesthesia Resident Assisting: Orpah Greek, MD    SURGICAL STAFF   Circulator: Leda Roys, RN  Relief Circulator: Solmon Ice, RN  Relief Scrub: Benjamin Stain  Scrub Person: Daleen Squibb; Lupita Shutter, RN  Resident Assisting: Oren Binet, MD; Thurston Hole, MD  Fellow: Charm Barges, MD    COMPLICATIONS  None    ESTIMATED BLOOD LOSS  None    SPECIMENS  ID Type Source Tests Collected by Time Destination   1 : retroperitoneal liposarcoma, right kidney, and adrenal Permanent SURG PATH SURG PATH REQUEST Wilmon Arms, MD 08/10/2022 [place], MD    August 11, 2022 6:34 PM      Electronically signed by Melodye Ped, MD at 08/12/2022  1:18 PM EDT

## 2022-08-10 NOTE — Nursing Note (Signed)
Formatting of this note might be different from the original.  On admission to [facility], from PACU a dual RN initial assessment of skin condition was performed by Scherrie Bateman, RN and Winferd Humphrey RN  Skin Assessment:  Not WDL-    Epidural mid back, Abd midline incision    Braden Score: 20    LDA Added:N     LDA added in PACU  Scherrie Bateman, RN    Electronically signed by Scherrie Bateman, RN at 08/10/2022  7:00 PM EDT

## 2022-08-10 NOTE — Op Note (Signed)
Formatting of this note is different from the original.  Surgical Oncology Operative Report    Patient: Trevor Crosby  MR Number: [id number]   Date of Birth: 08-10-1971  Date of Surgery: 08/10/2022     Pre-operative Diagnosis:       Retroperitoneal mass [R19.00]  Post-operative Diagnosis:     Retroperitoneal mass [R19.00]  Procedure:      Procedure(s) (LRB):  LAPAROTOMY EXPLORATORY (N/A)  CYSTOURETHROSCOPY W/ INSERTION STENT URETER (N/A)  EXCISION/DESTRUCTION INTRA-ABDOMINAL CYST TUMOR OPEN W/ SCALPEL (N/A)  NEPHRECTOMY OPEN (Right)  Surgeon:           Juliann Mule) and Role:  Panel 1:     * Wilmon Arms, MD - Primary  Panel 2:     * Melodye Ped, MD - Primary       Assistant:           Candise Bowens MD  J. Han MD  Staff:       Circulator: Leda Roys, RN  Relief Circulator: Solmon Ice, RN  Relief Scrub: Benjamin Stain  Scrub Person: Daleen Squibb; Lupita Shutter, RN  Resident Assisting: Oren Binet, MD; Thurston Hole, MD  Fellow: Charm Barges, MD  Anesthesia:General  Estimated Blood Loss:   1350 ml  Specimens:           ID Type Source Tests Collected by Time Destination   1 : retroperitoneal liposarcoma, right kidney, and adrenal Permanent SURG PATH SURG [place], MD 08/10/2022 [phone]      Drains:   none  Complications:   None  Operative Findings:   >70cm right retroperitoneal tumor encasing the kidney and abutting IVC but not adherent to IVC  Indications for procedure:  Trevor Crosby is a 51 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 treatment options were discussed and surgical resection was recommended.   lan for resection retroperitoneal mass, right nephrectomy, possible bowel resection, possible IVC resection and reconstruction.  Urology to place stents.  The risks benefits and alternatives to operative intervention were explained to the patient and the patient was willing to proceed.  Informed consent was obtained.  Procedure in detail:  Trevor Crosby was brought to the operating room and  placed on the operating room table in the supine position.  Surgical briefing was performed.  The anesthesia team performed general anesthesia and intubated the patient with a single-lumen endotracheal tube. The procedure was initiated by Dr.Singer team from urology placing bilateral ureteral stents. Please see their operative report for details. The patient was positioned supine. The abdomen was prepped and draped in the usual sterile fashion.  A routine [facility] preoperative surgical timeout was performed.  The procedure was commenced by making a midline incision. The fascia was incised. The abomen was entered and fixed retractor placed. The tumor consumed the majority of the abdomen originating from the right side. The right colon was identified on the left side of the tumor. There were thin peritoneal attacments that were taken down to mobilze it off the tumor. We continued with circumferential dissection of the tumor from the lateral abdominal wall attachments and retroperitoneal attachments. This allowed [place] to mobilize tumor and identify that it was not adherent to the distal IVC and iliacs. A portion of proximal small intestines was mobilized off tumor. Superiorly the tumor was mobilized off the retroperitoneal attachments of the liver. During this a small portion of liver capsule was disrupted. We then worked medially and identified the right kidney encased  by tumor. The renal vein and ureter were identified entering the tumor. We continued superior dissection, the adrenal was noted to be encased within tumor capsule. The adrenal vein was identified and ligated with clip. We mobilized the tumor off the infrahepatic IVC to the point it was only connected by the renal vein. The right ureter was ligated with 2-0 silk and clip. The left ureter was palpated in the retroperitoneum prior to confirm. The renal vein was dissected out and divided with vascular load of the stapler. During the dissection an arteriotomy was  made in the renal artery this was controlled with Judd-Allis and then divided with stapler. Final attachments of the tumor to the retroperitoneum were divided and the tumor was passed off for pathology.     The abdomen was inspected and irrigated, hemostasis was achieved. Trevor Crosby was placed along disrupted liver capsule and tumor bed. The fascia was then closed with #1 PDS. The skin was closed with 4-0 absorbable suture and dressed with Pirneo.     The patient was extubated and brought to the PACU in stable condition.    All counts, sharp, sponge and intrument counts were correct.    Trevor Crosby M.D.   Division Surgical Oncology  08/10/2022  10:47 AM      Electronically signed by Wilmon Arms, MD at 08/10/2022 11:52 AM EDT

## 2022-08-10 NOTE — OR Nursing (Signed)
Formatting of this note might be different from the original.  Pt denies history of chemo and radiation. Pt denies metal or foreign objects in body. Denies history of seizures, strokes, diabetes, or MI's.     Electronically signed by Gust Brooms, RN at 08/10/2022  6:27 AM EDT

## 2022-08-10 NOTE — Progress Notes (Signed)
Formatting of this note is different from the original.  Surgical Oncology Post-Op Check Note    Trevor Crosby is a 51 y.o. yr old male, who is now POD#0 s/p Procedure(s) (LRB):  LAPAROTOMY EXPLORATORY (N/A)  CYSTOURETHROSCOPY W/ INSERTION STENT URETER (N/A)  EXCISION/DESTRUCTION INTRA-ABDOMINAL CYST TUMOR OPEN W/ SCALPEL (N/A)  NEPHRECTOMY OPEN (Right).   There were no complications to the procedure, and tolerated it well.      Arrived to the floor in stable condition.      S: Patient resting comfortably in bed. No complaints of pain, nausea, vomiting, or dizziness. Patient endorses being slightly light-headed. All questions answered. No complaints.     O:   Vitals:    08/10/22 1435   BP: 111/67   Pulse: 74   Resp: 15   Temp: 97.8 F (36.6 C)   SpO2: 100%     Exam:    GEN: NAD, laying comfortably in bed   Pulm: no respiratory distress  Abd: soft, non-distended, non-tender, incision well-approximated, no drainage, clean/dry/intact  GU: Foley, pink-tinged urine   Ext: No LE swelling/edema  Neuro: No focal deficits    Assessment:  51 y.o. yr old male now POD#0 s/p Procedure(s) (LRB):  LAPAROTOMY EXPLORATORY (N/A)  CYSTOURETHROSCOPY W/ INSERTION STENT URETER (N/A)  EXCISION/DESTRUCTION INTRA-ABDOMINAL CYST TUMOR OPEN W/ SCALPEL (N/A)  NEPHRECTOMY OPEN (Right).    Currently stable on the floor.     Plan:  - DIET NPO with meds  - Pain ctrl: bupivacaine/fentayl epidural in place   - Patient advised to call with any concerns  - Will continue to monitor    Trevor Crosby, [place] Student       Electronically signed by Oliver Hum, APRN-CNP at 08/10/2022  2:45 PM EDT

## 2022-08-10 NOTE — Op Note (Signed)
Formatting of this note is different from the original.  Manuella Ghazi ([id number])    PRE OPERATIVE DIAGNOSIS  Retroperitoneal mass [R19.00]    POST OPERATIVE DIAGNOSIS   Post-Op Diagnosis Codes:     * Retroperitoneal mass [R19.00]     PROCEDURE PERFORMED  Procedure(s) (LRB):  LAPAROTOMY EXPLORATORY (N/A)  CYSTOURETHROSCOPY W/ INSERTION STENT URETER (N/A)  EXCISION/DESTRUCTION INTRA-ABDOMINAL CYST TUMOR OPEN W/ SCALPEL (N/A)  NEPHRECTOMY OPEN (Right)    PRIMARY CLOSURE  Yes    INTRAOPERATIVE FINDINGS  >70cm right retroperitoneal tumor encasing the kidney and abutting IVC but not adherent to IVC        SURGEON  Surgeon(s) and Role:  Panel 1:     * Wilmon Arms, MD - Primary  Panel 2:     * Melodye Ped, MD - Primary    ANESTHESIOLOGIST  Anesthesiologist: Lanney Gins, MD; Georgiann Hahn, MD  CRNA: Elie Goody, APRN-CRNA  Anesthesia Resident Assisting: Orpah Greek, MD    SURGICAL STAFF   Circulator: Leda Roys, RN  Relief Circulator: Solmon Ice, RN  Relief Scrub: Benjamin Stain  Scrub Person: Daleen Squibb; Lupita Shutter, RN  Resident Assisting: Oren Binet, MD; Thurston Hole, MD  Fellow: Charm Barges, MD    COMPLICATIONS  None    ESTIMATED BLOOD LOSS  1350 ml    SPECIMENS  below  ID Type Source Tests Collected by Time Destination   1 : retroperitoneal liposarcoma, right kidney, and adrenal Permanent SURG PATH SURG PATH REQUEST Wilmon Arms, MD 08/10/2022 [place], MD    August 10, 2022 10:46 AM      Electronically signed by Wilmon Arms, MD at 08/10/2022 10:47 AM EDT

## 2022-08-10 NOTE — Op Note (Signed)
Formatting of this note might be different from the original.  Operative Report  Patient Name: Trevor Crosby  MRN: [id number]  Date: 08/10/2022    Pre-operative Diagnosis: Abdominal Surgery    Post-operative Diagnosis: Abdominal Surgery    Procedure: Cystoscopy, Ureteral Catheterization (Bilateral)    Surgeon: Lonna Cobb, MD    Assistant(s): Thurston Hole, MD    Operative Indications:  Trevor Crosby is a 51 y.o. male in need of intra-operative ureteral identification. The urologic service was requested to place bilateral ureteral catheters to assist. The risks of the procedure including, but not limited to, bleeding, infection, and ureteral injury, were explained to the patient and she elected to proceed. Informed consent was obtained.     Operative Procedure:  The patient was taken to the operating room and placed supine on the operating table.  Pre-operative antibiotics were administered.  Bilateral lower extremity SCDs were placed.  After induction of general anesthesia the patient was positioned in dorsal lithotomy, prepped and draped in a sterile fashion.  A time-out was performed.      A rigid cystoscope was passed carefully via urethra into the bladder. A 5-French open-ended catheter was passed per left ureteral orifice with the assistance of a sensor wire until resistance met. The scope was removed and the procedure repeated on the right. The scope was again removed, a foley catheter placed, the ureteral catheters affixed to nephrostomy tube bags, and the procedure completed. The patient tolerated the procedure well.     Lonna Cobb, MD was present in the room for all critical portions of the procedure.    Plan:  - Primary team to remove ureteral catheters at the end of case by cutting silk sutures. Would inspect catheters to ensure equal length and removed in entirety.  - Catheter duration per primary team  - Common to have some blood in urine for 1-2 days  - Please call urology with any  questions    Electronically signed by Melodye Ped, MD at 08/10/2022  6:54 PM EDT

## 2022-08-10 NOTE — Progress Notes (Signed)
Formatting of this note is different from the original.  Surgical Oncology Post-Op Check Note    Trevor Crosby is a 51 y.o. yr old male, who is now POD#0 s/p Procedure(s) (LRB):  LAPAROTOMY EXPLORATORY (N/A)  CYSTOURETHROSCOPY W/ INSERTION STENT URETER (N/A)  EXCISION/DESTRUCTION INTRA-ABDOMINAL CYST TUMOR OPEN W/ SCALPEL (N/A)  NEPHRECTOMY OPEN (Right).   There were no complications to the procedure, and tolerated it well.      Arrived to the floor in stable condition.      S: Resting comfortably in bed. No pain, N/V, dizziness, numbness.  Does endorse slight light-headedness.     O:   Vitals:    08/10/22 1435   BP: 111/67   Pulse: 74   Resp: 15   Temp: 97.8 F (36.6 C)   SpO2: 100%     Exam:    GEN: NAD, laying comfortably in bed   Pulm: no respiratory distress  Abd: soft, NT, ND, midline incision CDI.   Ext: No LE swelling/edema  Neuro: No focal deficits  GU: foley in place with pink-tinged urine    Assessment:  51 y.o. yr old male now POD#0 s/p Procedure(s) (LRB):  LAPAROTOMY EXPLORATORY (N/A)  CYSTOURETHROSCOPY W/ INSERTION STENT URETER (N/A)  EXCISION/DESTRUCTION INTRA-ABDOMINAL CYST TUMOR OPEN W/ SCALPEL (N/A)  NEPHRECTOMY OPEN (Right).    Currently stable on the floor.     Plan:  - DIET NPO with meds  - Pain ctrl: epidural in place; tylenol, gabapentin, PRN tramadol   - Patient advised to call with any concerns  - Will continue to monitor    Carolynn Serve, APRN-CNP  [place] [phone]    Electronically signed by Oliver Hum, APRN-CNP at 08/10/2022  2:49 PM EDT

## 2022-08-10 NOTE — Progress Notes (Signed)
Formatting of this note might be different from the original.  I certify that this patient requires inpatient services at this time. I anticipate the expected length of stay will include at least two midnights.  Inpatient services are due to the following medical concerns sarcoma of retroperitoneum.    Plans for post hospitalization care will be discharge to home.    Electronically signed by Wilmon Arms, MD at 08/10/2022  7:13 AM EDT

## 2022-08-10 NOTE — H&P (Signed)
Formatting of this note might be different from the original.  PERIOPERATIVE HISTORY AND PHYSICAL UPDATE  Pre-op Diagnoses: Retroperitoneal mass [R19.00]  Operation: Procedure(s) (LRB):  LAPAROTOMY EXPLORATORY (N/A)  CYSTOURETHROSCOPY W/ INSERTION STENT URETER (N/A)  Surgeon(s): Surgeon(s) and Role:  Panel 1:     * Wilmon Arms, MD - Primary  Panel 2:     * Melodye Ped, MD - Primary    HISTORY AND PHYSICAL:  BP 126/78 (BP Location: Left arm, BP Position: Sitting)   Pulse 83   Temp 98.6 F (37 C) (Oral)   Resp 18   Ht 1.753 m ('5\' 9"'$ )   Wt 71.4 kg (157 lb 8 oz)   SpO2 99%   BMI 23.26 kg/m   Smoking Status Never     I have reviewed Trevor Crosby's medical, surgical and other pertinent history, and I have updated the medication and allergy information in the computerised patient record as indicated.    I have examined the patient, reviewed the previous H&P completed on date (07/29/2022) and there are no changes. Patient denies visits to the hospital, emergency department, urgent care, or other physicians in the interim.     On exam, the patient is resting comfortable in bed in no acute distress. Patient denies any current fevers, chills, URI symptoms, nausea/emesis, dyspnea, chest pain/pressure, palpitations, changes in bowel habits, or skin changes; reports that he is in his usual state of health.    - Proceed to OR as planned  - Dispo to follow postoperative course    Today's surgical history and physical update was completed by Oren Binet, MD, MD at 7:02 AM.    Pamella Pert L. Janan Halter, MD PhD  General Surgery  [phone]   Electronically signed by Wilmon Arms, MD at 08/10/2022  7:13 AM EDT

## 2022-08-11 NOTE — Nursing Note (Signed)
Formatting of this note is different from the original.     08/11/22 1302   Referral Information   Arrived From home or self-care;operating room   Readmission Information   Was patient readmitted within 30 Days? No   Information Source   Information Source patient ;review of medical record   Information Source Name [place] Number in person   Outpatient Providers   Outpatient Providers Updated In [facility] No   Contact Information   Case Manager/SW Added to Care Team Yes   This Writer is Primary Case Manager/SW No   Case Manager Name [facility]   Case Manager's Phone Number [phone]   Living Environment   Lives With alone   Living Arrangements house  (2 story with bed and bath on second level)   Provides Primary Care For child(ren)   Caregiving Concerns has shared custody of his 50 year old twin girls   Primary Care Provided By self   Support System Immediate family   Able to Return to Prior Arrangements no   Functional Status   Patient's Functional Status Prior To This Admission? Independent   Are There Status Changes This Admission? No   Concerns With Patient Being Able To Care For Themselves At Discharge?  Has Assistance (Friend, Family, Skilled Provider)   Who Is Patient's Engineer, maintenance For Discharge Planning, Education And Care For Discharge? sister available to assist   Can Support Person Meet The Care Needs Of The Patient? Yes   Employment/Financial   Employed? Yes   Employment Details Government social research officer for [employer]   Employment/Financial Concerns no   Employment/Financial Comments qualifies for STD   Source Of Income pension/retirement   Financial Concerns none   Insurance   Medical Insurance Verified Yes  ([place])   Prescription Coverage Yes   Pharmacy updated in [place] Yes   Initial Discharge [place] (PTA) No   Home Therapies (PTA) None   Medical Supplies (PTA) None   Patient Goal for Discharge Get better   Anticipated discharge disposition Home   Anticipated  Services at Discharge Outpatient follow up   Anticipated Changes Related to Illness none   Current Discharge Risk high risk diagnoses (i.e., CHF, Stroke, DM, chronic pain, abdominal pain, nausea and vomiting);lives alone   Transportation Available family or friend will provide  (sister)   Assessment/Concerns to be Addressed   Concerns To Be Addressed denies needs/concerns at this time   PCRM Initial Assessment  Met with Trevor Crosby to complete the initial assessment. Explained role and function of PCRM in multidisciplinary team.  Contact number provided for questions.  Demographic information reviewed with patient/family and confirmed as correct.      Reason for Admission: LAPAROTOMY EXPLORATORY; EXCISION/DESTRUCTION INTRA-ABDOMINAL CYST TUMOR OPEN W/ SCALPEL; NEPHRECTOMY OPEN; CYSTOURETHROSCOPY W/ INSERTION STENT URETER    Estimated length of stay:    3-5 days      Advanced directives   Patient does not have Advanced Directives on File  Patient is currently married and in the process of a divorce.    His children are minors.  He requests to complete HCPOA naming his sister Trevor Crosby as HCPOA.  SW notified    Lines/Drains/Tubes  PIV  No drains  Surgical incision    Initial PCRM Discharge Planning  Return to home with sister to assist and provide transportation.    Final plan will be determined closer to discharge, pending therapy and medical team recommendations.  Patient/family verbalized  understanding and agreement with the plan of care.  Patient/family have no questions at this time.  PCRM will continue to follow patient with multidisciplinary team for ongoing assessment of needs and for discharge planning. Medical team updated.    [name]  MSN, ACM-RN  [facility]  [phone]  Electronically signed by Rich Number, RN at 08/11/2022  1:12 PM EDT

## 2022-08-11 NOTE — Progress Notes (Signed)
Formatting of this note is different from the original.  Anesthesia Acute Pain Epidural Progress Note     Trevor Crosby is a 51 y.o. male who is 1 Day Post-Op from  Procedure(s) (LRB):  LAPAROTOMY EXPLORATORY (N/A)  CYSTOURETHROSCOPY W/ INSERTION STENT URETER (N/A)  EXCISION/DESTRUCTION INTRA-ABDOMINAL CYST TUMOR OPEN W/ SCALPEL (N/A)  NEPHRECTOMY OPEN (Right).    He has an epidural in place for management of his acute post-operative pain.    No acute events overnight. Pt states he has good pain control.    Pain at rest: 3/10  Pain with activity: 4/10  PCEA helps: Yes 0/0  Side effects: None  Pt has foley: Yes  Pt has chest tube: No    Epidural Solution: 0.125 % bupivacaine + Fentanyl 27mg/ml    Basal: 4 ml/hour  Bolus: 2  Lockout: 20 minutes  Total: 10 ml/hour    24 hour OME requirement:  9/12: 20 IV + 0 PO    Pain regimen:  Acetaminophen 975 mg q8h  Gabapentin 100 mg q8h  Ibuprofen 600 mg q8h  Hydromorphone 0.5 mg q3h prn  Oxycodone 5-10 mg q4h prn  Tramadol 50 mg q6h prn    Physical Exam:    Vitals:    08/11/22 0848   BP: 97/52   Pulse: 52   Resp: 24   Temp: 98.1 F (36.7 C)   SpO2: 100%     GEN: Reclining in bed, NAD   HEENT: NC/AT  CHEST: Equal chest rise bilaterally, no labored breathing  HEART: RRR  ABDOMEN: nondistended  EXTREMITIES: No edema   SKIN: Intact, no rashes  NEURO: Alert and oriented, no gross focal deficit    Catheter Site:  Erythema: No    Dressing: Dry and intact    Catheter 11 cm at skin    Impression: well managed acute post-surgical pain    Plan: Continue current regimen.    For DVT prophylaxis, patients with neuraxial/epidural catheters should not receive BID dosing of lovenox as this is absolutely contraindicated. Only lovenox '40mg'$  daily or SQH 5000u Q8H for DVT ppx are appropriate. If patient requires BID dosing of lovenox and it is felt that the risks of inadequate anticoagulation outweighs the benefits of the epidural, please contact the acute pain service to discuss epidural removal  prior to anticoagulation administration. Once the catheter is removed, DVT ppx with SQH can be resumed after 2 hours or with lovenox after 4 hours.     Please page acute pain service at [phone] call [phone] any questions or concerns.    APhilip Aspen MD  PGY-1  Department of Anesthesiology  Acute Pain Service    Acute Pain Service Attending Addendum    I personally examined and evaluated the patient.  I reviewed the case and medical record with the resident.  Based on the ROS, History, and Exam, I agree with the medical decision making.    MGeorgiann Hahn M.D.  Electronically signed by MGeorgiann Hahn MD at 08/12/2022  3:19 PM EDT

## 2022-08-11 NOTE — Nursing Note (Signed)
Formatting of this note might be different from the original.  Dr. Tempie Donning with Anesthesia paged "Patient is endorsing new onset numbness on L index finger (whole finger) and L middle finger (tip). Denies any numbness anywhere else."    1330-Dr. Hostetler w/ Surg Onc messaged "Patient is endorsing new onset numbness on L index finger (whole finger) and L middle finger (tip). Denies any numbness anywhere else. I notified anesthesia, waiting on their response."  Electronically signed by York Pellant, RN at 08/11/2022  1:39 PM EDT

## 2022-08-11 NOTE — Progress Notes (Signed)
Formatting of this note might be different from the original.  Surgical Oncology Daily Progress Note    S: No acute overnight events. Doing well this morning, denies pain. Not passing gas yet.     O:  Temp:  [97 F (36.1 C)-98.2 F (36.8 C)] 97 F (36.1 C)  Pulse (Heart Rate):  [55-90] 57  Resp Rate:  [15-26] 20  BP: (95-140)/(57-78) 96/58  Arterial Line (1) BP: (137-160)/(69-75) 154/69  O2 Sat (%):  [97 %-100 %] 100 %    I/O last 3 completed shifts:  In: 4400 [I.V.:3800; Blood:600]  Out: 1940 [Urine:590]    Bun/Creat/Cl/CO2/Glucose: 13/1.29/105/26/108 (09/13 0350)  WBC/Hgb/Hct/Plts: 7.66/9.1/28.0/219 (09/13 0350)  Na/K+/Phos/Mg/Ca: 135/4.5/4.1/2.3/7.7 (09/13 0350)    PE:  Constitutional: Alert, oriented, and in no acute distress. SR on telemetry.  Respiratory: Respirations easy and unlabored on room air  Abdominal: Soft, distended, appropriately tender on palpation with midline incision well approximated with prineo, no erythema or drainage.  Neurological: No obvious focal deficits noted.  Skin: No erythema, edema, rashes, or lesions on exposed skin.   GU: Foley in place with concentrated yellow drainage    A/P:  Trevor Crosby is a 51 y.o. 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   PMH: PVC's    1. Retroperitoneal Sarcoma - 1 Day Post-Op - advance diet to clears, DC foley, continue IVF and epidural, ambulate.  2. Acute Post Operative Abdominal Pain - bupivacaine/fentanyl epidural, scheduled tylenol, gabapentin, motrin, PRN dilaudid, oxy, tramadol.  3. Nutrition - DIET CLEAR LIQUID   4. Activity - Out of bed x 3 in chair, ambulate x 4 in hallways, IS Q1hr while awake  5. DVT/GI Prophylaxis - Lovenox and protonix in place.  6. Level of care - Med-surg  7. Disposition - Will plan for discharge when bowel function has returned, is tolerating a regular diet, and is on an oral pain regimen.  Anticipated discharge: likely end of week.    Lisabeth Devoid,  APRN-CNP  [place]      Electronically signed by Lisabeth Devoid, APRN-CNP at 08/11/2022  6:41 AM EDT

## 2022-08-12 NOTE — Progress Notes (Signed)
Formatting of this note is different from the original.  Anesthesia Acute Pain Epidural Progress Note     Trevor Crosby is a 51 y.o. male who is 2 Days Post-Op from  Procedure(s) (LRB):  LAPAROTOMY EXPLORATORY (N/A)  CYSTOURETHROSCOPY W/ INSERTION STENT URETER (N/A)  EXCISION/DESTRUCTION INTRA-ABDOMINAL CYST TUMOR OPEN W/ SCALPEL (N/A)  NEPHRECTOMY OPEN (Right).    He has an epidural in place for management of his acute post-operative pain.    No acute events overnight.Yesterday afternoon (9/13), pt endorsed numbness on Left index finger and left middle finger. Pt states this has resolved. His pain is well controlled this morning.    Pain at rest: 3/10  Pain with activity: 2/2  PCEA helps: Yes 0/0  Side effects: None  Pt has foley: Yes  Pt has chest tube: No    Epidural Solution: 0.125 % bupivacaine + Fentanyl 31mg/ml    Basal: 4 ml/hour  Bolus: 2  Lockout: 20 minutes  Total: 10 ml/hour    24 hour OME requirement:  9/12: 20 IV + 0 PO  9/13: 0 IV + 0 PO    Pain regimen:  Acetaminophen 975 mg q8h  Gabapentin 100 mg q8h  Hydromorphone 0.5 mg q3h prn  Oxycodone 5-10 mg q4h prn  Tramadol 50 mg q6h prn    Physical Exam:    Vitals:    08/12/22 1150   BP: 123/65   Pulse: 60   Resp: 16   Temp: 98 F (36.7 C)   SpO2: 100%     GEN: Reclining in bed, NAD   HEENT: NC/AT  CHEST: Equal chest rise bilaterally, no labored breathing  HEART: RRR  ABDOMEN: nondistended  EXTREMITIES: No edema   SKIN: Intact, no rashes  NEURO: Alert and oriented, no gross focal deficit    Catheter Site:  Erythema: No    Dressing: Dry and intact    Catheter 11 cm at skin    Impression: well managed acute post-surgical pain    Plan: Continue current regimen.    For DVT prophylaxis, patients with neuraxial/epidural catheters should not receive BID dosing of lovenox as this is absolutely contraindicated. Only lovenox '40mg'$  daily or SQH 5000u Q8H for DVT ppx are appropriate. If patient requires BID dosing of lovenox and it is felt that the risks of  inadequate anticoagulation outweighs the benefits of the epidural, please contact the acute pain service to discuss epidural removal prior to anticoagulation administration. Once the catheter is removed, DVT ppx with SQH can be resumed after 2 hours or with lovenox after 4 hours.     Please page acute pain service at [phone] call [phone] any questions or concerns.    APhilip Aspen MD  PGY-1  Department of Anesthesiology  Acute Pain Service    Acute Pain Service Attending Addendum:    I personally examined and evaluated Mr Trevor Crosby Epidural is working well to control postop pain, so we will continue with current management. I reviewed the case and medical record with the resident. Based on the history and exam, I agree with the medical decision making.    [name] MD  Assistant Professor    Electronically signed by RSharyn Blitz MD at 08/12/2022  2:49 PM EDT
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
## 2022-08-12 NOTE — Progress Notes (Signed)
Formatting of this note might be different from the original.  Surgical Oncology Daily Progress Note    S: No acute overnight events. Doing well this morning, denies pain. Passing gas, ambulating.  Tolerated clear liquids well yesterday.     O:  Temp:  [97.9 F (36.6 C)-98.4 F (36.9 C)] 98.4 F (36.9 C)  Pulse (Heart Rate):  [50-57] 57  Resp Rate:  [16-24] 16  BP: (97-110)/(52-60) 110/60  O2 Sat (%):  [97 %-100 %] 100 %    I/O last 3 completed shifts:  In: 2003.5 [I.V.:2003.5]  Out: 1175 [Urine:1175]    Bun/Creat/Cl/CO2/Glucose: 13/1.43/106/24/86 (09/14 0256)  WBC/Hgb/Hct/Plts: 6.01/8.8/27.4/230 (09/14 0256)  Na/K+/Phos/Mg/Ca: 135/4.8/2.7/1.8/7.8 (09/14 0256)    PE:  Constitutional: Alert, oriented, and in no acute distress. SR on telemetry.  Respiratory: Respirations easy and unlabored on room air  Abdominal: Soft, distended, appropriately tender on palpation with midline incision well approximated with prineo, no erythema or drainage.  Neurological: No obvious focal deficits noted.  Skin: No erythema, edema, rashes, or lesions on exposed skin.   GU: Foley in place with concentrated yellow drainage    A/P:  Trevor Crosby is a 51 y.o. 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   PMH: PVC's    1. Retroperitoneal Sarcoma - 2 Days Post-Op - advance diet to regular, DC IVF, and encourage ambulation.  Plan to hold lovenox tomorrow for epidural removal.  2. Acute Post Operative Abdominal Pain - bupivacaine/fentanyl epidural, scheduled tylenol, gabapentin, motrin, PRN dilaudid, oxy, tramadol.  3. Nutrition - DIET REGULAR   4. Activity - Out of bed x 3 in chair, ambulate x 4 in hallways, IS Q1hr while awake  5. DVT/GI Prophylaxis - Lovenox and protonix in place.  6. Level of care - Med-surg  7. Disposition - Will plan for discharge when bowel function has returned, is tolerating a regular diet, and is on an oral pain regimen.  Anticipated discharge: likely end  of week.    Wynelle Fanny, MD  [place]      Electronically signed by Wynelle Fanny, MD at 08/12/2022  8:32 AM EDT

## 2022-08-13 NOTE — Progress Notes (Signed)
Formatting of this note is different from the original.  ACUTE PAIN EPIDURAL PULL NOTE    Trevor Crosby is a 51 y.o. male 3 Days Post-Op from Procedure(s) (LRB):  LAPAROTOMY EXPLORATORY (N/A)  CYSTOURETHROSCOPY W/ INSERTION STENT URETER (N/A)  EXCISION/DESTRUCTION INTRA-ABDOMINAL CYST TUMOR OPEN W/ SCALPEL (N/A)  NEPHRECTOMY OPEN (Right)  with epidural in place for management of acute post-operative pain.    No acute events overnight.    Pain assessment:  Pain at rest: 3/10  Pain with activity: 2/10  PCEA helps: yes  Side effects: none  Pt has foley: no  Pt has chest tube: no    Epidural settings:  Epidural Solution: 0.125 % bupivacaine + fentanyl 2 mcg/ml  Basal: 4 ml/hour  Bolus: 2 ml/hour  Lockout: 20 minutes  Total: 10 ml/hour    Medications:  Acetaminophen 975 mg q8h  Gabapentin 100 mg q8h  Ibuprofen 600 mg q8h  Hydromorphone 0.5 q3h prn  Oxycodone 5-10 mg q4h prn  Tramadol 50 mg 6h prn    Physical Exam:  Vitals:    08/13/22 0759   BP: 133/76   Pulse: 52   Resp: 18   Temp: 98.6 F (37 C)   SpO2: 99%     Gen: No acute distress  Resp: Even and unlabored  Neuro: nonfocal exam grossly    Catheter Site:  Erythema: none, site clean without abnormalities    Lab Results   Component Value Date    WBC 5.16 08/13/2022    HGB 8.7 (L) 08/13/2022    HCT 27.2 (L) 08/13/2022    PLATELET  08/13/2022      Comment:      Platelets clumped, platelet estimate from smear is between 150 to 400.    MCV 82.7 08/13/2022     No results found for: "INR", "PT"  No results found for: "PTT"    Impression: Epidural had functioned well. Pain well-managed, and patient now tolerating PO.    Has required zero additional OMEs over the last 24 hours.     Last Anticoagulation Dose: Lovenox '40mg'$  on 9/14 at 0751    Plan:   Epidural pulled at 28:31 AM without complications, tip intact. Confirmed anticoagulation held prior to removal. Insertion site clean, non-erythematous, no bleeding. Site cleaned with alcohol wipe. Vital signs stable.    Continue  current pain medications per primary team. If acute pain remits or persists and primary team is unable to control the pain, please consult the chronic pain management team for additional assistance with medication management.    DVT prophylaxis:              Heparin: May resume immediately              Lovenox: Resume 4* hours after epidural removal  *Based on recommendations from [place] guidelines    Acute pain will follow tomorrow to ensure transition to PO regimen.  Please page [phone] or call [phone] with any questions or concerns.    Philip Aspen, MD      Electronically signed by Sharyn Blitz, MD at 08/13/2022  3:13 PM EDT    Associated attestation - Sharyn Blitz, MD - 08/13/2022  3:13 PM EDT  Formatting of this note might be different from the original.  Acute Pain Service Attending Addendum:    I personally examined and evaluated Trevor Crosby. Epidural worked well to control his postop pain. Epidural removed today without issue at the request of his primary team. Patient doing  well. I reviewed the case and medical record with the resident. Based on the history and exam, I agree with the medical decision making.    Lucky Cowboy, MD  Assistant Professor

## 2022-08-13 NOTE — Progress Notes (Signed)
Formatting of this note might be different from the original.  Surgical Oncology Daily Progress Note    S: No acute overnight events. Doing well this morning, denies pain. Passing gas, ambulating.  Tolerating diet, denies n/v.    O:  Temp:  [98 F (36.7 C)-98.4 F (36.9 C)] 98 F (36.7 C)  Pulse (Heart Rate):  [57-71] 66  Resp Rate:  [16-17] 16  BP: (110-125)/(60-73) 125/73  O2 Sat (%):  [100 %] 100 %    I/O last 3 completed shifts:  In: 2122.8 [P.O.:1100; I.V.:972.5; IV Piggyback:50.3]  Out: 2550 [Urine:2550]    Bun/Creat/Cl/CO2/Glucose: 12/1.43/106/28/97 (09/15 0431)  WBC/Hgb/Hct/Plts: 5.16/8.7/27.2/-- (09/15 0303)  Na/K+/Phos/Mg/Ca: 138/4.6/3.1/1.9/7.9 (09/15 0431)    PE:  Constitutional: Alert, oriented, and in no acute distress.  Respiratory: Respirations easy and unlabored on room air  Abdominal: Soft, distended, appropriately tender on palpation with midline incision well approximated with prineo, no erythema or drainage.  Neurological: No obvious focal deficits noted.  Skin: No erythema, edema, rashes, or lesions on exposed skin.   GU: Urinating without difficulty    A/P:  Trevor Crosby is a 51 y.o. 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   PMH: PVC's    1. Retroperitoneal Sarcoma - 3 Days Post-Op - continue to normalize today, and encourage ambulation.  Holding lovenox for epidural removal today.  2. Acute Post Operative Abdominal Pain - bupivacaine/fentanyl epidural, plan to DC today. Scheduled tylenol, gabapentin, motrin, PRN dilaudid, oxy, tramadol.  3. Nutrition - DIET REGULAR   4. Activity - Out of bed x 3 in chair, ambulate x 4 in hallways, IS Q1hr while awake  5. DVT/GI Prophylaxis - Lovenox (held) and protonix in place.  6. Level of care - Med-surg  7. Disposition - Will plan for discharge when bowel function has returned, is tolerating a regular diet, and is on an oral pain regimen.  Anticipated discharge: likely tomorrow.    Wynelle Fanny, MD  [place]      Electronically signed by Wynelle Fanny, MD at 08/13/2022  7:06 AM EDT

## 2022-08-14 NOTE — Nursing Note (Signed)
Formatting of this note is different from the original.     08/16/22 1420   Final Discharge Planning   Discharge Disposition Home   CM/SW AVS Portion Completed Yes   Plan   Patient/Family In Agreement With Plan yes   Transport Request   Mode of Transfer [place] Discharge Note  Review of medical record.  Patient discharged on Saturday 08/16/2022.  No continuity of care needs were identified. Per discharging nurse, patient's sister transported home.     Services for Discharge  Discharge to home.    Consults with Final Discharge Recommendations  NA    Lines/Tubes/Drains/Wounds/Supplies  Midline abdominal incision sealed with Dermabond Prineo     Medications  No barriers obtaining discharge medications.  Reconciliation of medications to be completed by the medical team.    Durable Medical Equipment  No needs were identified for discharge.    Choice  Was Patient Choice Provided: N/A    Transportation  Transportation will be provided by sister.    Education  Discharge education provided by the medical team and updated in the After Visit Summary.     Follow Up(s)  Any follow up requested by the medical team arranged.   Appointments in the After Visit Summary.   Future Appointments       Provider [place]    09/01/2022 11:00 AM [facility] of Surgical On[facility]  Arrive at: Arrive to [facility]       Was Ambulatory PCRM added to the Care Team?  NA     Risk of Readmission: 3.6  Category Reference:          Low: 0% - 5%          Medium - Low: 5.1% - 10%          Medium - High:   10.1% - 16%          High: 16.1% - 100%    Readmission Risk Interventions Documented: N/A    No other discharge needs have been identified at this time  Patient instructed to call with questions. PCRM will continue to follow with medical team for any additional discharge planning needs.     Ray Church BSN, RN-ACM  Patient Care Resource Manager, Surgical Oncology  Phone:   [phone]  Pager:  [phone]    If any changes to this individualized plan of care during evening and weekend hours and assistance is needed, please page the on call PCRM at [phone].      Electronically signed by Erin Fulling, RN at 08/16/2022  2:30 PM EDT

## 2022-08-14 NOTE — Discharge Summary (Signed)
Formatting of this note is different from the original.    Discharge Summary     MRN:  [id number]  Name: Trevor Crosby  Age: 51 y.o.  Birthday:  11/13/1971  Admit Date: 08/10/2022  5:15 AM  Discharge Date: 08/14/22  Discharge Time: 4:51 PM  Discharge Unit: [facility]    Admission Information  Admitting Physician: Wilmon Arms, MD     Discharge Information  Discharge Physician: Wilmon Arms, MD    Problem List  Active Hospital Problems    Diagnosis    ? H/O right nephrectomy    ? s/p Retroperitoneal mass resection       Resolved Hospital Problems   No resolved problems to display.     Brief Summary of Hospital Course for Discharge Summary:   51 y.o. male h/o retroperitoneal sarcoma, now Day of Surgery s/p ex lap, removal of large right retroperitoneal tumor, cystourethroscopy, and right nephrectomy on 08/10/22.    POD 1 9/13: Trevor Crosby is under appropriate epidural pain management. Foley was taken out. Clear diet was started.   POD 2 9/14: Advanced to regular diet.   POD 3 9/15: Pt passed flatus.Tolerating regular diets, Discontinued IVF, encouraged ambulation   POD 4 9/14: Pt came off epidural and is now on tylenol and oxycodone for pain control      On day of discharge, the patient afebrile and hemodynamically stable, tolerating a regular diet with pain controlled on oral pain medication, ambulating well, and voiding spontaneously. The patient will follow up with Dr. Scot Dock on 10/4. All other discharge instructions and follow up information are in the patient After Visit Summary.    Brief Summary of Consults for Discharge Summary:     Brief Summary of Procedures and Imaging for Discharge Summary:       Summary of last selected lab results and date obtained:    Lab Results   Component Value Date    WBC 4.77 08/14/2022    HGB 9.5 (L) 08/14/2022    HCT 29.3 (L) 08/14/2022    PLATELET 247 08/14/2022    MCV 80.7 08/14/2022     Lab Results   Component Value Date    SODIUM 138 08/14/2022    POTASSIUM 4.0 08/14/2022     CHLORIDE 105 08/14/2022    CO2 29 08/14/2022    BUN 11 08/14/2022    CREATSERUM 1.23 08/14/2022    GLUCOSE 116 (H) 08/14/2022     Lab Results   Component Value Date    ALT 13 07/14/2022    AST 16 07/14/2022    ALKPHOS 90 07/14/2022    BILITOTAL 0.4 07/14/2022     Brief Summary of Labs for Discharge Summary:       No discharge procedures on file.    Current Outpatient Meds:     Medication List for when you go home     START taking these medications    Acetaminophen 325 MG tablet  Take 3 tablets by mouth every 8 hours for 5 days.  Commonly known as: TYLENOL      Gabapentin 100 MG CAPS  Take 1 capsule by mouth every 8 hours for 5 days.  Commonly known as: NEURONTIN      oxyCODONE 5 MG TABS  Take 1 tablet by mouth every 6 hours as needed for Severe Pain for up to 5 days.  Commonly known as: ROXICODONE  For diagnoses: Acute postoperative pain      sennosides 8.6 MG TABS  Take 1  tablet by mouth every 12 hours as needed for Constipation.  Commonly known as: SENNA        CONTINUE taking these medications    MULTIVITAMIN ADULT PO  daily every morning.        STOP taking these medications    bisacodyl 5 MG tab DR  Commonly known as: DULCOLAX      Neomycin 500 MG TABS  Commonly known as: MYCIFRADIN      Polyethylene glycol 17 GM/SCOOP POWD powder  Commonly known as: MIRALAX          Follow-up:  No follow-up provider specified.     Upcoming Appointments (up to five)-Some appointments for [facility] outpatient clinics or diagnostic testing locations are not displayed below       Provider Department Dept Phone    09/01/2022 11:00 AM [facility]  Arrive at: Arrive to [facility] Floor Registration [phone]         Electronically signed by Wilmon Arms, MD at 08/16/2022  2:34 PM EDT

## 2022-08-14 NOTE — Care Plan (Signed)
Formatting of this note might be different from the original.  AVS printed and reviewed with patient and patients sister. PIV x2 removed. All questions answered. Patient's sister to transport home.   Problem: Patient Care Overview  Goal: Plan of Care Review  Outcome: Adequate for Discharge  Goal: Individualization & Mutuality  Outcome: Adequate for Discharge  Goal: Discharge Needs Assessment  Outcome: Adequate for Discharge  Goal: Interdisciplinary Rounds/Family Conf  Outcome: Adequate for Discharge    Electronically signed by Mariane Masters, RN at 08/14/2022  5:36 PM EDT

## 2022-08-14 NOTE — Progress Notes (Signed)
Formatting of this note might be different from the original.  Surgical Oncology Daily Progress Note    S: No acute overnight events. Doing well this morning, denies pain. Passing gas, ambulating.  Tolerating diet, denies n/v.    O:  Temp:  [97.5 F (36.4 C)-98.9 F (37.2 C)] 98.4 F (36.9 C)  Pulse (Heart Rate):  [54-70] 65  Resp Rate:  [16-18] 17  BP: (116-153)/(71-87) 116/73  O2 Sat (%):  [94 %-100 %] 100 %    I/O last 3 completed shifts:  In: 619.3 [P.O.:600; IV Piggyback:19.3]  Out: 900 [Urine:900]    Bun/Creat/Cl/CO2/Glucose: 11/1.23/105/29/116 (09/16 0402)  WBC/Hgb/Hct/Plts: 4.77/9.5/29.3/247 (09/16 0402)  Na/K+/Phos/Mg/Ca: 138/4.0/3.5/1.7/8.0 (09/16 0402)    PE:  Constitutional: Alert, oriented, and in no acute distress.  Respiratory: Respirations easy and unlabored on room air  Abdominal: Soft, minimally distended, nontender on palpation with midline incision well approximated with prineo, no erythema or drainage.  Neurological: No obvious focal deficits noted.  Skin: No erythema, edema, rashes, or lesions on exposed skin.   GU: Urinating without difficulty    A/P:  Trevor Crosby is a 51 y.o. 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   PMH: PVC's    1. Retroperitoneal Sarcoma - 4 Days Post-Op - continue to normalize today, and encourage ambulation.  2. Acute Post Operative Abdominal Pain - Scheduled tylenol, gabapentin, motrin, PRN dilaudid, oxy, tramadol.  3. Nutrition - DIET REGULAR   4. Activity - Out of bed x 3 in chair, ambulate x 4 in hallways, IS Q1hr while awake  5. DVT/GI Prophylaxis - Lovenox (held) and protonix in place.  6. Level of care - Med-surg  7. Disposition - Will plan for discharge when bowel function has returned, is tolerating a regular diet, and is on an oral pain regimen.  Anticipated discharge: later today versus tomorrow    Milana Kidney, MD  [place]      Electronically signed by Milana Kidney, MD at  08/14/2022  8:08 AM EDT

## 2022-08-25 ENCOUNTER — Encounter: Payer: PRIVATE HEALTH INSURANCE | Attending: Internal Medicine | Primary: Internal Medicine

## 2022-08-26 ENCOUNTER — Encounter

## 2022-08-26 ENCOUNTER — Encounter
Admit: 2022-08-26 | Discharge: 2022-08-26 | Payer: PRIVATE HEALTH INSURANCE | Attending: Internal Medicine | Primary: Internal Medicine

## 2022-08-26 DIAGNOSIS — Z0000 Encounter for general adult medical examination without abnormal findings: Secondary | ICD-10-CM

## 2022-08-26 NOTE — Assessment & Plan Note (Signed)
S/p right nephrectomy.  Retroperitoneal mass was wrapped around the right kidney.  We will obtain routine chemistries renal function today

## 2022-08-26 NOTE — Patient Instructions (Signed)
Well Visit, Ages 18 to 65: Care Instructions  Well visits can help you stay healthy. Your doctor has checked your overall health and may have suggested ways to take good care of yourself. Your doctor also may have recommended tests. You can help prevent illness with healthy eating, good sleep, vaccinations, regular exercise, and other steps.    Get the tests that you and your doctor decide on. Depending on your age and risks, examples might include screening for diabetes; hepatitis C; HIV; and cervical, breast, lung, and colon cancer. Screening helps find diseases before any symptoms appear.   Eat healthy foods. Choose fruits, vegetables, whole grains, lean protein, and low-fat dairy foods. Limit saturated fat and reduce salt.     Limit alcohol. Men should have no more than 2 drinks a day. Women should have no more than 1. For some people, no alcohol is the best choice.   Exercise. Get at least 30 minutes of exercise on most days of the week. Walking can be a good choice.     Reach and stay at your healthy weight. This will lower your risk for many health problems.   Take care of your mental health. Try to stay connected with friends, family, and community, and find ways to manage stress.     If you're feeling depressed or hopeless, talk to someone. A counselor can help. If you don't have a counselor, talk to your doctor.   Talk to your doctor if you think you may have a problem with alcohol or drug use. This includes prescription medicines and illegal drugs.     Avoid tobacco and nicotine: Don't smoke, vape, or chew. If you need help quitting, talk to your doctor.   Practice safer sex. Getting tested, using condoms or dental dams, and limiting sex partners can help prevent STIs.     Use birth control if it's important to you to prevent pregnancy. Talk with your doctor about your choices and what might be best for you.   Prevent problems where you can. Protect your skin from too much sun, wash your hands, brush  your teeth twice a day, and wear a seat belt in the car.   Where can you learn more?  Go to https://www.healthwise.net/patientEd and enter P072 to learn more about "Well Visit, Ages 18 to 65: Care Instructions."  Current as of: November 13, 2022Content Version: 13.8   2006-2023 [employer].   Care instructions adapted under license by [facility]. If you have questions about a medical condition or this instruction, always ask your healthcare professional. Healthwise, Incorporated disclaims any warranty or liability for your use of this information.           Well Visit 50 to 65: Care Instructions  Overview     Well visits can help you stay healthy. Your doctor has checked your overall health and may have suggested ways to take good care of yourself. Your doctor also may have recommended tests. At home, you can help prevent illness with healthy eating, regular exercise, and other steps.  Follow-up care is a key part of your treatment and safety. Be sure to make and go to all appointments, and call your doctor if you are having problems. It's also a good idea to know your test results and keep a list of the medicines you take.  How can you care for yourself at home?  Get screening tests that you and your doctor decide on. Screening helps find diseases before any symptoms   appear.  Eat healthy foods. Choose fruits, vegetables, whole grains, protein, and low-fat dairy foods. Limit fat, especially saturated fat. Reduce salt in your diet.  Limit alcohol. Have no more than 2 drinks a day or 14 drinks a week.  Get at least 30 minutes of exercise on most days of the week. Walking is a good choice. You also may want to do other activities, such as running, swimming, cycling, or playing tennis or team sports.  Reach and stay at a healthy weight. This will lower your risk for many problems, such as obesity, diabetes, heart disease, and high blood pressure.  Do not smoke. Smoking can make health  problems worse. If you need help quitting, talk to your doctor about stop-smoking programs and medicines. These can increase your chances of quitting for good.  Care for your mental health. It is easy to get weighed down by worry and stress. Learn strategies to manage stress, like deep breathing and mindfulness, and stay connected with your family and community. If you find you often feel sad or hopeless, talk with your doctor. Treatment can help.  Talk to your doctor about whether you have any risk factors for sexually transmitted infections (STIs). You can help prevent STIs if you wait to have sex with a new partner (or partners) until you've each been tested for STIs. It also helps if you use condoms (male or male condoms) and if you limit your sex partners to one person who only has sex with you. Vaccines are available for some STIs.  If it's important to you to prevent pregnancy with your partner, talk with your doctor about birth control options that might be best for you.  If you think you may have a problem with alcohol or drug use, talk to your doctor. This includes prescription medicines (such as amphetamines and opioids) and illegal drugs (such as cocaine and methamphetamine). Your doctor can help you figure out what type of treatment is best for you.  Protect your skin from too much sun. When you're outdoors from 10 a.m. to 4 p.m., stay in the shade or cover up with clothing and a hat with a wide brim. Wear sunglasses that block UV rays. Even when it's cloudy, put broad-spectrum sunscreen (SPF 30 or higher) on any exposed skin.  See a dentist one or two times a year for checkups and to have your teeth cleaned.  Wear a seat belt in the car.  When should you call for help?  Watch closely for changes in your health, and be sure to contact your doctor if you have any problems or symptoms that concern you.  Where can you learn more?  Go to https://www.healthwise.net/patientEd and enter K916 to learn more  about "Well Visit 50 to 65: Care Instructions."  Current as of: November 13, 2022Content Version: 13.8   2006-2023 [employer], Inc[employer].   Care instructions adapted under license by [facility]. If you have questions about a medical condition or this instruction, always ask your healthcare professional. Healthwise, Inc[employer] disclaims any warranty or liability for your use of this information.           Learning About Changing a Habit by Setting Goals  How can you change a habit?     If you've decided to change a habit--whether it's quitting smoking, lowering your blood pressure, becoming more active, or doing something else to improve your health--congratulations! Making that decision is the first step toward making a change.  What happens next? Have   a reason. Set goals you can reach. Prepare for slip-ups. And get support.  What's your reason?  Your reason for wanting to change a habit is really important. Maybe you want to quit smoking so that you can avoid future health problems. Or maybe you want to eat a healthier diet so you can lose weight. If you have high blood pressure, your reason may be clear: to lower your blood pressure. Maybe you smoke and want to save money on cigarettes.  You need to feel ready to make a change. If you don't feel ready now, that's okay. You can still be thinking and planning. When you truly want to make changes, you're ready for the next step.  It's not easy to change habits--but you can do it. Taking the time to really think about what will motivate or inspire you will help you reach your goals.  How do you set goals?  Setting goals can help a lot when you're trying to make a healthy change.  Focus on small goals. This will help you reach larger goals over time. With smaller goals, you'll have success more often, which will help you stay with it. For example, your large goal may be to lose 20 pounds. Your small goal could be to lose 5.  Write down your  goals. This will help you remember, and you'll have a clearer idea of what you want to achieve. Use a journal or notebook to record your goals. Hang up your plan where you will see it often as a reminder of what you're trying to do.  Make your goals specific. Specific goals help you measure your progress. For example, setting a goal to eat one extra serving of vegetables a day is better than a general goal to "eat more vegetables."  Focus on one goal at a time. By doing this, you're less likely to feel overwhelmed and then give up.  When you reach a goal, reward yourself.  Celebrate your new behavior and success for several days, and then think about setting your next goal.  How can you prepare for slip-ups?  It's perfectly normal to try to change a habit, go along fine for a while, and then have a setback. Lots of people try and try again before they reach their goals.  What are the things that might cause a setback for you? If you have tried to change a habit before, think about what helped you and what got in your way.  By thinking about these barriers now, you can plan ahead for how to deal with them if they happen.  There will be times when you slip up and don't make your goal for the week. When that happens, don't get mad at yourself. Learn from the experience. Ask yourself what got in the way of reaching your goal. Positive thinking goes a long way when you're making lifestyle changes.  How can you get support?  Get a partner. It's motivating to know that someone is trying to make the same change that you're making, like being more active or changing your eating habits. You have someone who is counting on you to help them succeed. That person can also remind you how far you've come.  Get friends and family involved. They can exercise with you. Or they can encourage you by saying how they admire what you are doing. Family members can join you in your healthy eating efforts. Don't be afraid to tell family and  friends that their   encouragement makes a big difference to you.  Join a class or support group. People in these groups often have some of the same barriers you have. They can give you support when you don't feel like staying with your plan. They can boost your morale when you need a lift. You'll also find a number of online support groups.  Encourage yourself. When you feel like giving up, don't waste energy feeling bad about yourself. Remember your reason for wanting to change, think about the progress you've made, and give yourself a pep talk and a pat on the back.  Get professional help. A dietitian can help you make your diet healthier while still allowing you to eat foods that you enjoy. A trainer or physical therapist can help design an exercise program that is fun and easy to stay on. A counselor, a social worker, or your doctor can help you overcome hurdles, reduce stress, or quit smoking.  Where can you learn more?  Go to https://www.healthwise.net/patientEd and enter E882 to learn more about "Learning About Changing a Habit by Setting Goals."  Current as of: June 25, 2023Content Version: 13.8   2006-2023 [employer], [employer].   Care instructions adapted under license by [facility]. If you have questions about a medical condition or this instruction, always ask your healthcare professional. Healthwise, Incorporated disclaims any warranty or liability for your use of this information.           A Healthy Lifestyle: Care Instructions  A healthy lifestyle can help you feel good, have more energy, and stay at a weight that's healthy for you. You can share a healthy lifestyle with your friends and family. And you can do it on your own.    Eat meals with your friends or family. You could try cooking together.   Plan activities with other people. Go for a walk with a friend, try a free online fitness class, or join a sports league.     Eat a variety of healthy foods. These include fruits,  vegetables, whole grains, low-fat dairy, and lean protein.   Choose healthy portions of food. You can use the Nutrition Facts label on food packages as a guide.     Eat more fruits and vegetables. You could add vegetables to sandwiches or add fruit to cereal.   Drink water when you are thirsty. Limit soda, juice, and sports drinks.     Try to exercise most days. Aim for at least 2 hours of exercise each week.   Keep moving. Work in the garden or take your dog on a walk. Use the stairs instead of the elevator.     If you use tobacco or nicotine, try to quit. Ask your doctor about programs and medicines to help you quit.   Limit alcohol. Men should have no more than 2 drinks a day. Women should have no more than 1. For some people, no alcohol is the best choice.   Follow-up care is a key part of your treatment and safety. Be sure to make and go to all appointments, and call your doctor if you are having problems. It's also a good idea to know your test results and keep a list of the medicines you take.  Where can you learn more?  Go to https://www.healthwise.net/patientEd and enter U807 to learn more about "A Healthy Lifestyle: Care Instructions."  Current as of: November 13, 2022Content Version: 13.8   2006-2023 [employer], Inc[employer].   Care instructions adapted under license by [facility]. If you have questions about a medical condition or this instruction, always ask your healthcare professional. Healthwise, Incorporated disclaims any warranty or liability for your use of this information.      Heart-Healthy Diet   Sodium, Fat, and Cholesterol Controlled Diet       What Is a Heart Healthy Diet?   A heart-healthy diet is one that limits sodium , certain types of fat , and cholesterol . This type of diet is recommended for:   People with any form of cardiovascular disease (eg, coronary heart disease , peripheral vascular disease , previous heart attack , previous stroke )   People with risk  factors for cardiovascular disease, such as high blood pressure , high cholesterol , or diabetes   Anyone who wants to lower their risk of developing cardiovascular disease   Sodium    Sodium is a mineral found in many foods. In general, most people consume much more sodium than they need. Diets high in sodium can increase blood pressure and lead to edema (water retention). On a heart-healthy diet, you should consume no more than 2,300 mg (milligrams) of sodium per dayabout the amount in one teaspoon of table salt. The foods highest in sodium include table salt (about 50% sodium), processed foods, convenience foods, and preserved foods.   Cholesterol    Cholesterol is a fat-like, waxy substance in your blood. Our bodies make some cholesterol. It is also found in animal products, with the highest amounts in fatty meat, egg yolks, whole milk, cheese, shellfish, and organ meats. On a heart-healthy diet, you should limit your cholesterol intake to less than 200 mg per day.   It is normal and important to have some cholesterol in your bloodstream. But too much cholesterol can cause plaque to build up within your arteries, which can eventually lead to a heart attack or stroke.   The two types of cholesterol that are most commonly referred to are:   Low-density lipoprotein (LDL) cholesterol  Also known as bad cholesterol, this is the cholesterol that tends to build up along your arteries. Bad cholesterol levels are increased by eating fats that are saturated or hydrogenated. Optimal level of this cholesterol is less than 100. Over 130 starts to get risky for heart disease.   High-density lipoprotein (HDL) cholesterol  Also known as good cholesterol, this type of cholesterol actually carries cholesterol away from your arteries and may, therefore, help lower your risk of having a heart attack. You want this level to be high (ideally greater than 60). It is a risk to have a level less than 40. You can raise this good  cholesterol by eating olive oil, canola oil, avocados, or nuts. Exercise raises this level, too.   Fat    Fat is calorie dense and packs a lot of calories into a small amount of food. Even though fats should be limited due to their high calorie content, not all fats are bad. In fact, some fats are quite healthful. Fat can be broken down into four main types.   The good-for-you fats are:   Monounsaturated fat  found in oils such as olive and canola, avocados, and nuts and natural nut butters; can decrease cholesterol levels, while keeping levels of HDL cholesterol high   Polyunsaturated fat  found in oils such as safflower, sunflower, soybean, corn, and sesame; can decrease total cholesterol and LDL cholesterol   Omega-3 fatty acids  particularly those found in fatty fish (such as salmon, trout, tuna,   mackerel, herring, and sardines); can decrease risk of arrhythmias, decrease triglyceride levels, and slightly lower blood pressure   The fats that you want to limit are:   Saturated fat  found in animal products, many fast foods, and a few vegetables; increases total blood cholesterol, including LDL levels   Animal fats that are saturated include: butter, lard, whole-milk dairy products, meat fat, and poultry skin   Vegetable fats that are saturated include: hydrogenated shortening, palm oil, coconut oil, cocoa butter   Hydrogenated or trans fat  found in margarine and vegetable shortening, most shelf stable snack foods, and fried foods; increases LDL and decreases HDL     It is generally recommended that you limit your total fat for the day to less than 30% of your total calories. If you follow an 1800-calorie heart healthy diet, for example, this would mean 60 grams of fat or less per day.   Saturated fat and trans fat in your diet raises your blood cholesterol the most, much more than dietary cholesterol does. For this reason, on a heart-healthy diet, less than 7% of your calories should come from saturated fat and  ideally 0% from trans fat. On an 1800-calorie diet, this translates into less than 14 grams of saturated fat per day, leaving 46 grams of fat to come from mono- and polyunsaturated fats.   Food Choices on a Heart Healthy Diet   Food Category   Foods Recommended   Foods to Avoid   Grains   Breads and rolls without salted tops Most dry and cooked cereals Unsalted crackers and breadsticks Low-sodium or homemade breadcrumbs or stuffing All rice and pastas   Breads, rolls, and crackers with salted tops High-fat baked goods (eg, muffins, donuts, pastries) Quick breads, self-rising flour, and biscuit mixes Regular bread crumbs Instant hot cereals Commercially prepared rice, pasta, or stuffing mixes   Vegetables   Most fresh, frozen, and low-sodium canned vegetables Low-sodium and salt-free vegetable juices Canned vegetables if unsalted or rinsed   Regular canned vegetables and juices, including sauerkraut and pickled vegetables Frozen vegetables with sauces Commercially prepared potato and vegetable mixes   Fruits   Most fresh, frozen, and canned fruits All fruit juices   Fruits processed with salt or sodium   Milk   Nonfat or low-fat (1%) milk Nonfat or low-fat yogurt Cottage cheese, low-fat ricotta, cheeses labeled as low-fat and low-sodium   Whole milk Reduced-fat (2%) milk Malted and chocolate milk Full fat yogurt Most cheeses (unless low-fat and low salt) Buttermilk (no more than 1 cup per week)   Meats and Beans   Lean cuts of fresh or frozen beef, veal, lamb, or pork (look for the word loin) Fresh or frozen poultry without the skin Fresh or frozen fish and some shellfish Egg whites and egg substitutes (Limit whole eggs to three per week) Tofu Nuts or seeds (unsalted, dry-roasted), low-sodium peanut butter Dried peas, beans, and lentils   Any smoked, cured, salted, or canned meat, fish, or poultry (including bacon, chipped beef, cold cuts, hot dogs, sausages, sardines, and anchovies) Poultry skins Breaded and/or  fried fish or meats Canned peas, beans, and lentils Salted nuts   Fats and Oils   Olive oil and canola oil Low-sodium, low-fat salad dressings and mayonnaise   Butter, margarine, coconut and palm oils, bacon fat   Snacks, Sweets, and Condiments   Low-sodium or unsalted versions of broths, soups, soy sauce, and condiments Pepper, herbs, and spices; vinegar, lemon, or lime juice Low-fat frozen   desserts (yogurt, sherbet, fruit bars) Sugar, cocoa powder, honey, syrup, jam, and preserves Low-fat, trans-fat free cookies, cakes, and pies Graham and animal crackers, fig bars, ginger snaps   High-fat desserts Broth, soups, gravies, and sauces, made from instant mixes or other high-sodium ingredients Salted snack foods Canned olives Meat tenderizers, seasoning salt, and most flavored vinegars   Beverages   Low-sodium carbonated beverages Tea and coffee in moderation Soy milk   Commercially softened water   Suggestions   Make whole grains, fruits, and vegetables the base of your diet.    Choose heart-healthy fats such as canola, olive, and flaxseed oil, and foods high in heart-healthy fats, such as nuts, seeds, soybeans, tofu, and fish.    Eat fish at least twice per week; the fish highest in omega-3 fatty acids and lowest in mercury include salmon, herring, mackerel, sardines, and canned chunk light tuna. If you eat fish less than twice per week or have high triglycerides, talk to your doctor about taking fish oil supplements.    Read food labels.   For products low in fat and cholesterol, look for fat free, low-fat, cholesterol free, saturated fat free, and trans fat freeAlso scan the Nutrition Facts Label, which lists saturated fat, trans fat, and cholesterol amounts.   For products low in sodium, look for sodium free, very low sodium, low sodium, no added salt, and unsalted   Skip the salt when cooking or at the table; if food needs more flavor, get creative and try out different herbs and spices. Garlic and onion also add  substantial flavor to foods.    Trim any visible fat off meat and poultry before cooking, and drain the fat off after browning.    Use cooking methods that require little or no added fat, such as grilling, boiling, baking, poaching, broiling, roasting, steaming, stir-frying, and sauting.    Avoid fast food and convenience food. They tend to be high in saturated and trans fat and have a lot of added salt.    Talk to a registered dietitian for individualized diet advice.      Last Reviewed: March 2011 Maria Adams, MS, MPH, RD   Updated: 02/24/2010     WELL ADULT LIFESTYLE INSTRUCTIONS:    Pick a day in the next week to spend an hour reviewing the information below then:     1) determine your health goals for the year   2) determine what changes you need to achieve those goals   3) design your daily routine, shopping habits etc to implement those changes        Default Right Action (no choices)       Make it EASY to do the RIGHT THINGS.   4) I invite you to send me your plans via mychart so I can continue to help you       with them    Examine your lifestyle with an emphasis on BARRIERS to bad and good habits and how you can design your life to make better choices.    If you want to feel better these are the FUNDAMENTAL PILLARS of Wellness:    1)  You can choose to Get 150 min/week of moderate exercise (can talk but can't        sing) or 75 min/week of vigorous exercise (can't talk).   This will enhance your sense of well being (Exercise is as good as medicine for   depression.)    2)  You can choose to   Get 7-9 hours of sleep per night    Detoxifies your brain, reduces risk of dementia    3)  You can choose to Strength Train 2 x a week on non-consecutive days   This will improve function and reduce risk of injury.   Body weight type exercises   such as Yoga and Pilates are excellent choices.    4)  You can choose Good Nutrition.  Only eat your goal weight (in lbs) x 10        calories/day and get 5 servings of  Vegetables/day   Plant based diets reduce risk of heart attack/stroke and will help you feel full on   less food.   Avoid highly processed foods and processed carbohydrates.    5)  You can choose Moderate alcohol intake < 1-2 drinks/day   Alcohol will disrupt your sleep and add calories to your day.    6)  You can choose to Develop a Charismatic/Supportive relationship.     This will strengthen your resilience for the ups and downs.    7)  You can choose to Practice Mindfulness.     An hour a day of prayer/meditation/gratitude will change your life!    If you are trying to lose weight, here are some recommendations for weight loss:  Not every weight loss program is appropriate for everybody...  good online sources include Noom (more social with daily check ins), Lifesum (similar but less social) and Naturally slim, as well as Brandneu ($1500)    The GI Diet or "Primal diet", Intermittent fasting can also be effective choices.    If you have diabetes treated with insulin be sure to ask for specific guidance around meals.    Take your desired weight in pounds and multiply by 10 and that is your average daily calorie allowance.  For example if you wish to weigh 170 lb x 10 = 1700 cal/day (this is how to gradually lose the weight and maintain your desired weight).    Avoid soda/coke and all "wet carbs" => Drink ice water instead    Drink a large glass of ice water before meals and EAT SLOWLY (talk while you eat)!    Rethink your hunger => it means your losing weight.    Minimize highly processed carbohydrates as they stimulate your appetite:  Specifically cut back on Bread, Rice, Pasta and Potatoes    Avoid eating calories after 6 pm        DASH Diet: After Your Visit  Your Care Instructions  The DASH diet is an eating plan that can help lower your blood pressure. DASH stands for Dietary Approaches to Stop Hypertension. Hypertension is high blood pressure.  The DASH diet focuses on eating foods that are high in calcium,  potassium, and magnesium. These nutrients can lower blood pressure. The foods that are highest in these nutrients are fruits, vegetables, low-fat dairy products, nuts, seeds, and legumes. But taking calcium, potassium, and magnesium supplements instead of eating foods that are high in those nutrients does not have the same effect. The DASH diet also includes whole grains, fish, and poultry.  The DASH diet is one of several lifestyle changes your doctor may recommend to lower your high blood pressure. Your doctor may also want you to decrease the amount of sodium in your diet. Lowering sodium while following the DASH diet can lower blood pressure even further than just the DASH diet alone.  Follow-up care is a key part of your treatment   and safety. Be sure to make and go to all appointments, and call your doctor if you are having problems. It's also a good idea to know your test results and keep a list of the medicines you take.  How can you care for yourself at home?  Following the DASH diet  Eat 4 to 5 servings of fruit each day. A serving is 1 medium-sized piece of fruit,  cup chopped or canned fruit, 1/4 cup dried fruit, or 4 ounces ( cup) of fruit juice. Choose fruit more often than fruit juice.  Eat 4 to 5 servings of vegetables each day. A serving is 1 cup of lettuce or raw leafy vegetables,  cup of chopped or cooked vegetables, or 4 ounces ( cup) of vegetable juice. Choose vegetables more often than vegetable juice.  Get 2 to 3 servings of low-fat and fat-free dairy each day. A serving is 8 ounces of milk, 1 cup of yogurt, or 1  ounces of cheese.  Eat 7 to 8 servings of grains each day. A serving is 1 slice of bread, 1 ounce of dry cereal, or  cup of cooked rice, pasta, or cooked cereal. Try to choose whole-grain products as much as possible.  Limit lean meat, poultry, and fish to 6 ounces each day. Six ounces is about the size of two decks of cards.  Eat 4 to 5 servings of nuts, seeds, and legumes  (cooked dried beans, lentils, and split peas) each week. A serving is 1/3 cup of nuts, 2 tablespoons of seeds, or  cup cooked dried beans or peas.  Limit sweets and added sugars to 5 servings or less a week. A serving is 1 tablespoon jelly or jam,  cup sorbet, or 1 cup of lemonade.  Tips for success  Start small. Do not try to make dramatic changes to your diet all at once. You might feel that you are missing out on your favorite foods and then be more likely to not follow the plan. Make small changes, and stick with them. Once those changes become habit, add a few more changes.  Try some of the following:  Make it a goal to eat a fruit or vegetable at every meal and at snacks. This will make it easy to get the recommended amount of fruits and vegetables each day.  Try yogurt topped with fruit and nuts for a snack or healthy dessert.  Add lettuce, tomato, cucumber, and onion to sandwiches.  Combine a ready-made pizza crust with low-fat mozzarella cheese and lots of vegetable toppings. Try using tomatoes, squash, spinach, broccoli, carrots, cauliflower, and onions.  Have a variety of cut-up vegetables with a low-fat dip as an appetizer instead of chips and dip.  Sprinkle sunflower seeds or chopped almonds over salads. Or try adding chopped walnuts or almonds to cooked vegetables.  Try some vegetarian meals using beans and peas. Add garbanzo or kidney beans to salads. Make burritos and tacos with mashed pinto beans or black beans     2006-2012 [employer]. Care instructions adapted under license by [facility]. This care instruction is for use with your licensed healthcare professional. If you have questions about a medical condition or this instruction, always ask your healthcare professional. [employer] disclaims any warranty or liability for your use of this information.  Content Version: 9.4.94723; Last Revised: February 11, 2011                Alcohol Abuse and Alcoholism    (  Alcohol Dependence; Alcohol Use Disorder)       Definition   Alcohol abuse is excessive or problematic alcohol consumption. It can progress to alcoholism.   Alcoholism is chronic alcohol abuse that results in a physical dependence on alcohol (withdrawal symptoms) and an inability to stop or limit drinking.   Causes   Several factors can contribute to alcohol abuse and alcoholism, including:   Genes   Brain chemicals that may be different than normal   Social pressure   Emotional stress   Pain   Depression and other mental health problems   Problem drinking behaviors learned from family or friends   Risk Factors   These factors increase your chance of developing alcoholism. Tell your doctor if you have any of these risk factors:   Sex: male   Family members who abuse alcohol (especially men whose fathers or brothers are alcoholic)   Starting to use alcohol at an early age (younger than 14)   Using illicit drugs or non-medical use of prescription drugs   Peer pressure   Easy access to alcoholic beverages   Psychiatric disorders, such as depression or anxiety   Smoking   Symptoms   It is common to deny an alcohol problem. Alcohol abuse can occur without physical dependence.   Alcohol abuse symptoms include:   Repeated work, school, or home problems due to drinking   Risking physical safety   Recurring trouble with the law, often including drinking and driving   Continuing to drink despite alcohol-related difficulties   Symptoms of alcoholism include:   Craving a drink   Unable to stop or limit drinking   Needing greater amounts of alcohol to feel the same effect   Giving up activities in order to drink or recover from alcohol   Drinking that continues even when it causes or worsens health problems   Wanting to stop or reduce drinking, but not being able   Withdrawal symptoms if alcohol is stopped include:   Nausea   Sweating   Shaking   Anxiety   Increased blood pressure   Seizures ( delirium tremens [DTs])   The  brain, nervous system, heart, liver, stomach, gastrointestinal tract, and pancreas can all be damaged by alcoholism.     Some of the Organs Damaged in Alcohol Abuse        2011 [employer]   Diagnosis   Doctors ask a series of questions to assess possible alcohol-related problems, including:   Have you tried to reduce your drinking?   Have you felt bad about drinking?   Have you been annoyed by another person's criticism of your drinking?   Do you drink in the morning to steady your nerves or cure a hangover?   Do you have problems with a job, your family, or the law?   Do you drive under the influence of alcohol?   Blood tests may be done to:   Look at the size of your red blood cells and to check for a substance called carbohydrate-deficient transferrin   Check for alcohol-related liver disease and other health problems   Treatment   Treatment for alcohol abuse or dependence is aimed at teaching patients how to manage the disease. Most professionals believe that this means giving up alcohol completely and permanently.   The first and most important step is recognizing a problem exists. Successful treatment depends on your desire to change. Your doctor can help you withdraw from alcohol safely. This could require   hospitalization in a detoxification center. They will carefully monitor you for side effects. You may need medication while you are undergoing detoxification.   Treatments include:   Medications    Drugs can help relieve some of the symptoms of withdrawal and help prevent relapse. The doctor may prescribe medication to reduce cravings for alcohol.   Medications used to treat alcoholism and to try to prevent drinking include:   Naltrexone (ReVia, Vivitrol)blocks the high that makes you crave alcohol   Disulfiram (Antabuse)makes you very sick if you drink alcohol   Acamprosate (Campral)reduces your craving for alcohol   A study showed that an anticonvulsant drug, topiramate (Topamax), may  reduce alcohol dependence.   Education and Counseling    Therapy helps you to recognize alcohol's dangers. Education raises awareness of underlying issues and lifestyles that promote drinking. In therapy, you work to improve coping skills and learn other ways of dealing with stress or pain.   Mentoring and Community Help    Alcoholics Anonymous (AA) helps many people to stop drinking and stay sober. Members meet regularly and support each other. Your family members may also benefit from attending meetings of Al-Anon. Living with an alcoholic can be a painful, stressful situation.   Here are some general statistics on treatment outcomes of individuals one year after attempting to stop drinking:   1/3 remained abstinent   1/3 resumed drinking but at a lower level   1/3 relapsed completely   If you are diagnosed with alcohol abuse or alcoholism, follow your doctor's instructions .   Prevention   Realizing that alcohol causes problems helps some people avoid it. Suggestions to decrease the risk of alcohol abuse and dependence include:   Socialize without alcohol.   Avoid going to bars.   Do not keep alcohol in your home.   Avoid situations and people that encourage drinking.   Make new nondrinking friends.   Do fun things that do not involve alcohol.   Avoid reaching for a drink when stressed or upset.   Limit your alcohol intake to a moderate level.   Moderate is two or fewer drinks per day for men and one or fewer for women and older adults   A 12-ounce bottle of beer, a five-ounce glass of wine, or 1.5 ounces of liquor is considered one drink   If you are a parent, having a good relationship with your children may reduce their risk of alcohol abuse.   Most professionals who treat alcohol abuse and dependence believe that complete abstinence is the only effective prevention.     Last Reviewed: September 2010 Ryan Estevez, MD, PhD, MPH   Updated: 08/18/2009     Patient information: Weight loss  treatments    INTRODUCTION--Obesity is a major international problem, and Americans are among the heaviest people in the world. The percentage of obese people in the [place] has risen steadily.  Many people find that although they initially lose weight by dieting, they quickly regain the weight after the diet ends. Because it so hard to keep weight off over time, it is important to have as much information and support as possible before starting a diet. You are most likely to be successful in losing weight and keeping it off when you believe that your body weight can be controlled.  STARTING A WEIGHT LOSS PROGRAM--Some people like to talk to their doctor or nurse to get help choosing the best plan, monitoring progress, and getting advice and support along the way.    To know what treatment (or combination of treatments) will work best, determine your body mass index (BMI) and waist circumference (measurement). The BMI is calculated from your height and weight.  A person with a BMI between 25 and 29.9 is considered overweight   A person with a BMI of 30 or greater is considered to be obese  A waist circumference greater than 35 inches (88 cm) in women and 40 inches (102 cm) in men increases the risk of obesity-related complications, such as heart disease and diabetes. People who are obese and who have a larger waist size may need more aggressive weight loss treatment than others. Talk to your doctor or nurse for advice.  Types of treatment--Based on your measurements and your medical history, your doctor or nurse can determine what combination of weight loss treatments would work best for you. Treatments may include changes in lifestyle, exercise, dieting, and, in some cases, weight loss medicines or weight loss surgery. Weight loss surgery, also called bariatric surgery, is reserved for people with severe obesity who have not responded to other weight loss treatments.   SETTING A WEIGHT LOSS GOAL--It is  important to set a realistic weight loss goal. Your first goal should be to avoid gaining more weight and staying at your current weight (or within 5 percent). Many people have a "dream" weight that is difficult or impossible to achieve.  People at high risk of developing diabetes who are able to lose 5 percent of their body weight and maintain this weight will reduce their risk of developing diabetes by about 50 percent and reduce their blood pressure. This is a success.  Losing more than 15 percent of your body weight and staying at this weight is an extremely good result, even if you never reach your "dream" or "ideal" weight.  LIFESTYLE CHANGES--Programs that help you to change your lifestyle are usually run by psychologists or other professionals. The goals of lifestyle changes are to help you change your eating habits, become more active, and be more aware of how much you eat and exercise, helping you to make healthier choices.  This type of treatment can be broken down into three steps:  The triggers that make you want to eat   Eating   What happens after you eat  Triggers to eat--Determining what triggers you to eat involves figuring out what foods you eat and where and when you eat. To figure out what triggers you to eat, keep a record for a few days of everything you eat, the places where you eat, how often you eat, and the emotions you were feeling when you ate.  For some people, the trigger is related to a certain time of day or night. For others, the trigger is related to a certain place, like sitting at a desk working.  Eating--You can change your eating habits by breaking the chain of events between the trigger for eating and eating itself. There are many ways to do this. For instance, you can:  Limit where you eat to a few places (eg, dining room)   Restrict the number of utensils (eg, only a fork) used for eating   Drink a sip of water between each bite   Chew your food a certain number of  times   Get up and stop eating every few minutes  What happens after you eat--Rewarding yourself for good eating behaviors can help you to develop better habits. This is not a reward for weight loss; instead,   it is a reward for changing unhealthy behaviors.  Do not use food as a reward. Some people find money, clothing, or personal care (eg, a hair cut, manicure, or massage) to be effective rewards. Treat yourself immediately after making better eating choices to reinforce the value of the good behavior.  You need to have clear behavior goals, and you must have a time frame for reaching your goals. Reward small changes along the way to your final goal.  Other factors that contribute to successful weight loss--Changing your behavior involves more than just changing unhealthy eating habits; it also involves finding people around you to support your weight loss, reducing stress, and learning to be strong when tempted by food.  Establish a "buddy" system -- Having a friend or family member available to provide support and reinforce good behavior is very helpful. The support person needs to understand your goals.   Learn to be strong -- Learning to be strong when tempted by food is an important part of losing weight. As an example, you will need to learn how to say "no" and continue to say no when urged to eat at parties and social gatherings. Develop strategies for events before you go, such as eating before you go or taking low-calorie snacks and drinks with you.   Develop a support system -- Having a support system is helpful when losing weight. This is why many commercial groups are successful. Family support is also essential; if your family does not support your efforts to lose weight, this can slow your progress or even keep you from losing weight.   Positive thinking -- People often have conversations with themselves in their head; these conversations can be positive or negative. If you eat a piece of cake  that was not planned, you may respond by thinking, "Oh, you stupid idiot, you've blown your diet!" and as a result, you may eat more cake.  A positive thought for the same event could be, "Well, I ate cake when it was not on my plan. Now I should do something to get back on track." A positive approach is much more likely to be successful than a negative one.  Reduce stress -- Although stress is a part of everyday life, it can trigger uncontrolled eating in some people. It is important to find a way to get through these difficult times without eating or by eating low-calorie food, like raw vegetables. It may be helpful to imagine a relaxing place that allows you to temporarily escape from stress. With deep breaths and closed eyes, you can imagine this relaxing place for a few minutes.   Self-help programs -- Self-help programs like [employer], Overeaters Anonymous, and [employer] Off [employer] (TOPS) work for some people. As with all weight loss programs, you are most likely to be successful with these plans if you make long-term changes in how you eat.  CHOOSING A DIET--A calorie is a unit of energy found in food. Your body needs calories to function. The goal of any diet is to burn up more calories than you eat.   How quickly you lose weight depends upon several factors, such as your age, gender, and starting weight.  Older people have a slower metabolism than young people, so they lose weight more slowly.   Men lose more weight than women of similar height and weight when dieting because they use more energy.   People who are extremely overweight lose weight more quickly than those who are  only mildly overweight.  Try not to drink alcohol or drinks with added sugar, and most sweets (candy, cakes, cookies), since they rarely contain important nutrients.  Portion-controlled diets--One simple way to diet is to buy packaged foods, like frozen low-calorie meals or meal-replacement canned drinks. A typical  meal plan for one day may include:  A meal-replacement drink or breakfast bar for breakfast   A meal-replacement drink or a frozen low-calorie (250 to 350 calories) meal for lunch   A frozen low-calorie meal or other prepackaged, calorie-controlled meal, along with extra vegetables for dinner  This would give you 1000 to 1500 calories per day.  Low-fat diet--To reduce the amount of fat in your diet, you can:  Eat low-fat foods. Low-fat foods are those that contain less than 30 percent of calories from fat. Fat is listed on the food facts label (figure 1).   Count fat grams. For a 1500 calorie diet, this would mean about 45 g or fewer of fat per day.  Low-carbohydrate diet--Low- and very-low-carbohydrate diets (eg, Atkins diet, [place] diet) have become popular ways to lose weight quickly.  With a very-low-carbohydrate diet, you eat between 0 and 60 grams of carbohydrates per day (a standard diet contains 200 to 300 grams of carbohydrates)   With a low-carbohydrate diet, you eat between 60 and 130 grams of carbohydrates per day  Carbohydrates are found in fruits, vegetables, and grains (including breads, rice, pasta, and cereal), alcoholic beverages, and in dairy products. Meat and fish do not contain carbohydrates.  Side effects of very-low-carbohydrate diets can include constipation, headache, bad breath, muscle cramps, diarrhea, and weakness.  Mediterranean diet--The term "Mediterranean diet" refers to a way of eating that is common in olive-growing regions around the [place]. Although there is some variation in Mediterranean diets, there are some similarities. Most Mediterranean diets include:  A high level of monounsaturated fats (from olive or canola oil, walnuts, pecans, almonds) and a low level of saturated fats (from butter)   A high amount of vegetables, fruits, legumes, and grains (7 to 10 servings of fruits and vegetables per day)   A moderate amount of milk and dairy products, mostly  in the form of cheese. Use low-fat dairy products (skim milk, fat-free yogurt, low-fat cheese).   A relatively low amount of red meat and meat products. Substitute fish or poultry for red meat.   For those who drink alcohol, a modest amount (mainly as red wine) may help to protect against cardiovascular disease. A modest amount is up to one (4 ounce) glass per day for women and up to two glasses per day for men.  Which diet is best?--Studies have compared different diets, including:  Very-low-carbohydrate (AtkinsT)   Macronutrient balance controlling glycemic load (Zone)   Reduced-calorie (Weight Watchers)   Very-low-fat (Ornish)  No one diet is "best" for weight loss. Any diet will help you to lose weight if you stick with the diet. Therefore, it is important to choose a diet that includes foods you like.  Fad diets--Fad diets often promise quick weight loss (more than 1 to 2 pounds per week) and may claim that you do not need to exercise or give up favorite foods. Some fad diets cost a lot of money, because you have to pay for seminars or pills. Fad diets generally lack any scientific evidence that they are safe and effective, but instead rely on "before" and "after" photos or testimonials.  Diets that sound too good to be true   usually are. These plans are a waste of time and money and are not recommended. A doctor, nurse, or nutritionist can help you find a safe and effective way to lose weight and keep it off.  WEIGHT LOSS MEDICINES--Taking a weight loss medicine may be helpful when used in combination with diet, exercise, and lifestyle changes. However, it is important to understand the risks and benefits of these medicines. It is also important to be realistic about your goal weight using a weight loss medicine; you may not reach your "dream" weight, but you may be able to reduce your risk of diabetes or heart disease.   Weight loss medicines may be recommended for people who have not been able to lose  weight with diet and exercise who have a:  BMI of 30 or more    BMI between 27 and 29.9 and have other medical problems, such as diabetes, high cholesterol, or high blood pressure  Two weight loss medicines are approved in the [place] for long-term use. These are sibutramineand orlistat.  Other weight loss medicines (phentermine, diethylpropion) are available but are only approved for short-term use (up to 12 weeks).  Sibutramine--Sibutramine(Meridia, Reductil) is a medicine that reduces your appetite. In people who take the medicine for one year, the average weight loss is 10 percent of the initial body weight (5 percent more than those who took a placebo treatment).  Side effects of sibutramineinclude insomnia, dry mouth, and constipation. Increases in blood pressure can occur. Therefore, blood pressure is usually monitored during treatment. There is no evidence that sibutramine causes heart or lung problems (like dexfenfluramine and fenfluramine (Phen/Fen)). However, experts agree that sibutramine should not used by people with coronary heart disease, heart failure, uncontrolled hypertension, stroke, irregular heart rhythms, or peripheral vascular disease (poor circulation in the legs).  Orlistat--Orlistat(Xenical 120 mg capsules) is a medicine that reduces the amount of fat your body absorbs from the foods you eat. A lower-dose version is now available without a prescription (Alli 60 mg capsules) in many countries, including the [place]. The medicine is recommended three times per day, taken with a meal; you can skip a dose if you skip a meal or if the meal contains no fat.  After one year of treatment with orlistat, the average weight loss is approximately 8 to 10 percent of initial body weight (4 percent more than in those who took a placebo). Cholesterol levels often improve, and blood pressure sometimes falls. In people with diabetes, orlistat may help control blood sugar  levels.  Side effects occur in 15 to 10 percent of people and may include stomach cramps, gas, diarrhea, leakage of stool, or oily stools. These problems are more likely when you take orlistatwith a high-fat meal (if more than 30 percent of calories in the meal are from fat). Side effects usually improve as you learn to avoid high-fat foods. Severe liver injury has been reported rarely in patients taking orlistat, but it is not known if orlistat caused the liver problems.  Diet supplements--Diet supplements are widely used by people who are trying to lose weight, although the safety and efficacy of these supplements are often unproven. A few of the more common diet supplements are discussed below; none of these are recommended because they have not been studied carefully, and there is no proof they are safe or effective.  Chitosanand wheat dextrin are ineffective for weight loss, and their use is not recommended.   Ephedra, a compound related to ephedrine, is  no longer available in the [place] due to safety concerns. Many nonprescription diet pills previously contained ephedra. Although some studies have shown that ephedra helps with weight loss, there can be serious side effects (psychiatric symptoms, palpitations, and stomach upset), including death.   There are not enough data about the safety and efficacy of chromium, ginseng, glucomannan, green tea, hydroxycitric acid, L carnitine, psyllium, pyruvate supplements, St. Johns wort, and conjugated linoleic acid.   Two supplements from [place], Raiford Sim (also known as the [place] pill) and Herbathin dietary supplement, have been shown to contain prescription drugs.   Hoodia gordonii is a dietary supplement derived from a plant in [place]. It is not recommended because there is no proof that it is safe or effective.   Bitter orange (Citrus aurantium) can increase your heart rate and blood pressure and is not recommended.  SHOULD I HAVE  SURGERY TO LOSE WEIGHT? -- Weight loss surgery is recommended ONLY for people with one of the following:  Severe obesity (body mass index above 40) (calculator 1 and calculator 2) who have not responded to diet, exercise, or weight loss medicines   Body mass index between 35 and 40, along with a serious medical problem (including diabetes, severe joint pain, or sleep apnea) that would improve with weight loss  You should be sure that you understand the potential risks and benefits of weight loss surgery. You must be motivated and willing to make lifelong changes in how you eat to reach and maintain a healthier weight after surgery. You must also be realistic about weight loss after surgery (see 'Effectiveness of weight loss surgery' below).  PREPARING FOR WEIGHT LOSS SURGERY -- Most people who have weight loss surgery will meet with several specialists before surgery is scheduled. This often includes a dietitian, mental health counselor, a doctor who specializes in care of obese people, and a surgeon who performs weight loss surgery (bariatric surgeon). You may need to work with these providers for several weeks or months before surgery.  The nutritionist will explain what and how much you will be able to eat after surgery. You may also need to lose a small amount of weight before surgery.   The mental health specialist will help you to cope with stress and other factors that can make it harder to lose weight or trigger you to eat   The medical doctor will determine whether you need other tests, counseling, or treatment before surgery. He or she might also help you begin a medical weight loss program so that you can lose some weight before surgery.   The bariatric surgeon will meet with you to discuss the surgeries available to treat obesity. He or she will also make sure you are a good candidate for surgery.   TYPES OF WEIGHT LOSS SURGERY -- There are several types of weight loss surgeries, the most common being  lap banding, gastric bypass, and gastric sleeve.  Lap banding -- Laparoscopic adjustable gastric banding (LAGB), or lap banding, is a surgery that uses an adjustable band around the opening to the stomach (figure 1). This reduces the amount of food that you can eat at one time.  Lap banding is done through small incisions, with a laparoscope. The band can be adjusted after surgery, allowing you to eat more or less food. Adjustments to the size and tightness of the band are made by using a needle to add or remove fluid from a port (a small container under the skin  that is connected to the band). Adding fluid to the band makes it tighter which restricts the amount of food you can eat and may help you to lose more weight.  Lap banding is a popular choice because it is relatively simple to perform, can be adjusted or removed, and has a low risk of serious complications immediately after surgery. However, weight loss with the lap band depends on your ability to follow the program closely.  You will need to prepare nutritious meals that "work with" the band, not against it. For example, the lap band will not work well if you eat or drink a large amount of liquid calories (like ice cream). The band will not help you to feel full when you eat/drink liquid calories.  Weight loss ranges from 45 to 75 percent after two years. As an example, a person who is 120 pounds overweight could expect to lose approximately 54 to 90 pounds in the two years after lap banding.  Gastric bypass -- Roux-en-Y gastric bypass, also called gastric bypass, helps you to lose weight by reducing the amount of food you can eat and reducing the number of calories and nutrients you absorb from the food you eat.  To perform gastric bypass, a surgeon creates a small stomach pouch by dividing the stomach and attaching it to the small intestine. This helps you to lose weight in two ways:  The smaller stomach can hold less food than before surgery. This  causes you to feel full after eating a very small amount of food or liquid. Over time, the pouch might stretch, allowing you to eat more food.   The body absorbs fewer calories, since food bypasses most of the stomach as well as the upper small intestine. This new arrangement seems to decrease your appetite and change how you break down foods by changing the release of various hormones.  Gastric bypass can be performed as open surgery (through an incision on the abdomen) or laparoscopically, which uses smaller incisions and smaller instruments. Both the laparoscopic and open techniques have risks and benefits. You and your surgeon should work together to decide which surgery, if any, is right for you.  Gastric bypass has a high success rate, and people lose an average of 62 to 68 percent of their excess body weight in the first year. Weight loss typically levels off after one to two years, with an overall excess weight loss between 50 and 75 percent. For a person who is 120 pounds overweight, an average of 60 to 90 pounds of weight loss would be expected.  Gastric sleeve -- Gastric sleeve, also known as sleeve gastrectomy, is a surgery that reduces the size of the stomach and makes it into a narrow tube (figure 3). The new stomach is much smaller and produces less of the hormone (ghrelin) that causes hunger, helping you feel satisfied with less food.  Sleeve gastrectomy is safer than gastric bypass because the intestines are not rearranged, and there is less chance of malnutrition. It also appears to control hunger better than lap banding. It might be safer than the lap banding because no foreign materials are used.  The gastric sleeve has a good success rate, and people lose an average of 33 percent of their excess body weight in the first year. For a person who is 120 pounds overweight, this would mean losing about 40 pounds in the first year.  WEIGHT LOSS SURGERY COMPLICATIONS -- A variety of complications can  occur with  weight loss surgery. The risks of surgery depend upon which surgery you have and any medical problems you had before surgery. Some of the more common early surgical complications (one to six weeks after surgery) include:  Bleeding   Infection   Blockage or tear in the bowels   Need for further surgery  Important medical complications after surgery can include blood clots in the legs or lungs, heart attack, pneumonia, and urinary tract infection.   Complications are less likely when surgery is performed in centers that are experienced in weight loss surgery. In general, centers with experience in weight loss surgery have:  Board-certified doctors and surgeons   A team of support staff (dietitians, counselors, nurses)   Long-term follow-up after surgery   Hospital staff experienced with the care of weight loss patients. This includes nurses who are trained in the care of patients immediately after surgery and anesthesiologists who are experienced in caring for the morbidly obese.  EFFECTIVENESS OF WEIGHT LOSS SURGERY -- The goal of weight loss surgery is to reduce the risk of illness or death associated with obesity. Weight loss surgery can also help you to feel and look better, reduce the amount of money you spend on medicines, and cut down on sick days.   As an example, weight loss surgery can improve health problems related to obesity (diabetes, high blood pressure, high cholesterol, sleep apnea) to the point that you need less or no medicine.  Finally, weight loss surgery might reduce your risk of developing heart disease, cancer, and certain infections.  AFTER WEIGHT LOSS SURGERY -- You will need to stay in the hospital until your team feels that it is safe for you to leave (on average, one to three days). Do not drive if you are taking prescription pain medicine. Begin exercising as soon as possible once you have healed; most weight loss centers will design an exercise program for you.  Once you are  home, it is important to eat and drink exactly what your doctor and dietitian recommend. You will see your doctor, nurse, and dietitian on a regular basis after surgery to monitor your health, diet, and weight loss.   You will be able to slowly increase how much you eat over time, although it will always be important to:  Eat small, frequent meals and not skip meals   Chew your food slowly and completely   Avoid eating while "distracted" (such as eating while watching TV)   Stop eating when you feel full   Drink liquids at least 30 minutes before or after eating   Avoid foods high in fat or sugar   Take vitamin supplements, as recommended  It can take several months to learn to listen to your body so that you know when you are hungry and when you are full. You may dislike foods you previously loved, and you may begin to prefer new foods. This can be a frustrating process for some people, so talk to your dietitian if you are having trouble.  It usually takes between one and two years to lose weight after surgery. After reaching their goal weight, some people have plastic surgery (called "body contouring") to remove excess skin from the body, particularly in the abdominal area.  Before you decide to have weight loss surgery, you must commit to staying healthy for life. This includes following up with your healthcare team, exercising most days of the week, and eating a sensible diet every day. It can be difficult to   develop new eating and exercise habits after weight loss surgery, and you will have to work hard to stick to your goals.  Recovering from surgery and losing weight can be stressful and emotional, and it is important to have the support of family and friends. Working with a Education officer, museum, therapist, or support group can help you through the ups and downs.  WHERE TO GET MORE INFORMATION--Your healthcare provider is the best source of information for questions and concerns related to your medical  problem.  This article will be updated as needed every four months on our Web site ([email or web address])    High-Fiber Diet     What Is Fiber?   Dietary fiber is a form of carbohydrate found in plants that cannot be digested by humans. All plants contain fiber, including fruits, vegetables, grains, and legumes. Fiber is often classified into two categories: soluble and insoluble.   Soluble fiber draws water into the bowel and can help slow digestion. Examples of foods that are high in soluble fiber include oatmeal, oat bran, barley, legumes (eg, beans and peas), apples, and strawberries.   Insoluble fiber speeds digestion and can add bulk to the stool. Examples of foods that are high in insoluble fiber include whole-wheat products, wheat bran, cauliflower, green beans, and potatoes.   Why Follow a High-Fiber Diet?   A high-fiber diet is often recommended to prevent and treat constipation , hemorrhoids , diverticulitis , and irritable bowel syndrome . Eating a high-fiber diet can also help improve your cholesterol levels, lower your risk of coronary heart disease , reduce your risk of type 2 diabetes , and lower your weight. For people with type 1 or 2 diabetes, a high-fiber diet can also help stabilize blood sugar levels.   How Much Fiber Should I Eat?   A high-fiber diet should contain  20-35 grams  of fiber a day. This is actually the amount recommended for the general adult population; however, most Americans eat only 15 grams of fiber per day.   Digestion of Fiber   Eating a higher fiber diet than usual can take some getting used to by your body's digestive system. To avoid the side effects of sudden increases in dietary fiber (eg, gas, cramping, bloating, and diarrhea), increase fiber gradually and be sure to drink plenty of fluids every day.   Tips for Increasing Fiber Intake   Whenever possible, choose whole grains over refined grains (eg, brown rice instead of white rice, whole-wheat bread instead of  white bread).    Include a variety of grains in your diet, such as wheat, rye, barley, oats, quinoa, and bulgur.    Eat more vegetarian-based meals. Here are some ideas: black bean burgers, eggplant lasagna, and veggie tofu stir-fry.    Choose high-fiber snacks, such as fruits, popcorn, whole-grain crackers, and nuts.    Make whole-grain cereal or whole-grain toast part of your daily breakfast regime.    When eating out, whether ordering a sandwich or dinner, ask for extra vegetables.    When baking, replace part of the white flour with whole-wheat flour. Whole-wheat flour is particularly easy to incorporate into a recipe.    High-Fiber Diet Eating Guide   Food Category   Foods Recommended   Notes   Grains   Whole-grain breads, muffins, bagels, or pita bread Rye bread Whole-wheat crackers or crisp breads Whole-grain or bran cereals Oatmeal, oat bran, or grits Wheat germ Whole-wheat pasta and brown rice   Read the ingredients list on  food labels. Look for products that list "whole" as the first ingredient (eg, whole-wheat, whole oats). Choose cereals with at least 2 grams of fiber per serving.   Vegetables   All vegetables, especially asparagus, bean sprouts, broccoli, Brussels sprouts, cabbage, carrots, cauliflower, celery, corn, greens, green beans, green pepper, onions, peas, potatoes (with skin), snow peas, spinach, squash, sweet potatoes, tomatoes, zucchini   For maximum fiber intake, eat the peels of fruits and vegetablesjust be sure to wash them well first.   Fruits   All fruits, especially apples, berries, grapefruits, mangoes, nectarines, oranges, peaches, pears, dried fruits (figs, dates, prunes, raisins)   Choose raw fruits and vegetables over juice, cooked, or cannedraw fruit has more fiber. Dried fruit is also a good source of fiber.   Milk   With the exception of yogurt containing inulin (a type of fiber), dairy foods provide little fiber.   Add more fiber by topping your yogurt or cottage cheese with  fresh fruit, whole grain or bran cereals, nuts, or seeds.   Meats and Beans   All beans and peas, especially Garbanzo beans, kidney beans, lentils, lima beans, split peas, and pinto beans All nuts and seeds, especially almonds, peanuts, [place] nuts, cashews, peanut butter, walnuts, sesame and sunflower seeds All meat, poultry, fish, and eggs   Increase fiber in meat dishes by adding pinto beans, kidney beans, black-eyed peas, bran, or oatmeal. If you are following a low-fat diet, use nuts and seeds only in moderation.   Fats and Oils   All in moderation   Fats and oils do not provide fiber   Snacks, Sweets, and Condiments   Fruit Nuts Popcorn, whole-wheat pretzels, or trail mix made with dried fruits, nuts, and seeds Cakes, breads, and cookies made with oatmeal or whole-wheat flour   Most snack foods do not provide much fiber. Choose snacks with at least 2 grams of fiber per serving.     Last Reviewed: March 2011 Suzanne Boron, MS, MPH, RD   Updated: 02/24/2010       Safer Sex: After Your Visit  Your Care Instructions  Safer sex is a way to reduce your risk of getting an infection spread through sex. It can also help prevent pregnancy. Most infections that are spread through sex, also called sexually transmitted infections or STIs, can be cured. But some can decrease your chances of getting pregnant if they are not treated early. Others, such as herpes, have no cure. And some, such as HIV, can be deadly.  Several products can help you practice safer sex and reduce your chance of STIs. One of the best is a condom. There are condoms for men and for women. The male condom is a tube of soft plastic with a closed end that is placed deep into the vagina. You can use a special rubber sheet (dental dam) for protection during oral sex. Latex gloves can keep your hands from touching blood, semen, or other body fluids that can carry infections.  Remember that birth control methods such as diaphragms, IUDs, foams, and birth  control pills do not stop you from getting STIs.  Follow-up care is a key part of your treatment and safety. Be sure to make and go to all appointments, and call your doctor if you are having problems. It's also a good idea to know your test results and keep a list of the medicines you take.  How can you care for yourself at home?  Think about getting shots to prevent  hepatitis A and hepatitis B. These two diseases can be spread through sex. You also can get hepatitis A if you eat infected food.  Use condoms or male condoms each time and every time you have sex.  Learn the right way to use a male condom:  Condoms come in several sizes. Make sure you use the right size. A condom that is too small can break easily. A condom that is too big can slip off during sex. Use a new condom each time you have sex.  Be careful not to poke a hole in the condom when you open the wrapper.  Squeeze the tip of the condom to keep out air.  Pull down the loose skin (foreskin) from the head of an uncircumcised penis.  While squeezing the tip of the condom, unroll it all the way down to the base of the firm penis.  Never use petroleum jelly (such as Vaseline), grease, hand lotion, baby oil, or anything with oil in it. These products can make holes in the condom.  After sex, hold the condom on your penis as you remove your penis from your partner. This will keep semen from spilling out of the condom.  Learn to use a male condom:  You can put in a male condom up to 8 hours before sex.  Squeeze the smaller ring at the closed end and insert it deep into the vagina. The larger ring at the open end should stay outside the vagina.  During sex, make sure the penis goes into the condom.  After the penis is removed, close the open end of the condom by twisting it. Remove the condom.  Do not use a male condom and male condom at the same time.  Do not have sex with anyone who has symptoms of an STI, such as sores on the genitals or mouth.  The herpes virus that causes cold sores can spread to and from the penis and vagina.  Do not drink a lot of alcohol or use drugs before sex. This can cause you to let down your guard and not practice safer sex.  Having one sex partner (who does not have STIs and does not have sex with anyone else) is a sure way to avoid STIs.  Talk to your partner before you have sex. Find out if he or she has or is at risk for any STI. Keep in mind that a person may be able to spread an STI even if he or she does not have symptoms. You and your partner may want to get an HIV test. You should get tested again 6 months later.     2006-2012 [employer]. Care instructions adapted under license by [facility]. This care instruction is for use with your licensed healthcare professional. If you have questions about a medical condition or this instruction, always ask your healthcare professional. [employer] disclaims any warranty or liability for your use of this information.  Content Version: 9.4.94723; Last Revised: December 17, 2010                Keep Your Memory Sharp       Many factors can affect your ability to remembera hectic lifestyle, aging, stress, chronic disease, and certain medicines. But, there are steps you can take to sharpen your mind and help preserve your memory.   Challenge Your Brain   Regularly challenging your mind may help keeps it in top shape. Good mental exercises include:     Crossword puzzlesUse a dictionary if you need it; you will learn more that way.   Brainteasers Try some!   Crafts, such as wood working and sewing   Hobbies, such as gardening and building model airplanes   SocializingVisit old friends or join groups to meet new ones.   Reading   Learning a new language   Taking a class, whether it be art history or tai chi   TravelingExperience the food, history, and culture of your destination   Learning to use a computer   Going to museums, the theater, or  thought-provoking movies   Changing things in your daily life, such as reversing your pattern in the grocery store or brushing your teeth using your nondominant hand   Use Memory Aids   There is no need to remember every detail on your own. These memory aids can help:   Calendars and day planners   Electronic organizers to store all sorts of helpful informationThese devices can "beep" to remind you of appointments.   A book of days to record birthdays, anniversaries, and other occasions that occur on the same date every year   Detailed "to-do" lists and strategically placed sticky notes   Quick "study" sessionsBefore a gathering, review who will be there so their names will be fresh in your mind.   Establish routinesFor example, keep your keys, wallet, and umbrella in the same place all the time or take medicine with your 8:00 AM glass of juice   Live a Healthy Life   Many actions that will keep your body strong will do the same for your mind. For example:   Talk to Your Doctor About Herbs and Supplements    Malnutrition and vitamin deficiencies can impair your mental function. For example, vitamin B12 deficiency can cause a range of symptoms, including confusion. But, what if your nutritional needs are being met? Can herbs and supplements still offer a benefit? Researchers have investigated a range of natural remedies, such as ginkgo , ginseng , and the supplement phosphatidylserine (PS). So far, though, the evidence is inconsistent as to whether these products can improve memory or thinking.   If you are interested in taking herbs and supplements, talk to your doctor first because they may interact with other medicines that you are taking.   Exercise Regularly    Among the many benefits of regular exercise are increased blood flow to the brain and decreased risk of certain diseases that can interfere with memory function. One study found that even moderate exercise has a beneficial effect. Examples of "moderate"  exercise include:   Playing 18 holes of golf once a week, without a cart   Playing tennis twice a week   Walking one mile per day   Manage Stress    It can be tough to remember what is important when your mind is cluttered. Make time for relaxation. Choose activities that calm you down, and make it routine.   Manage Chronic Conditions    Side effects of high blood pressure , diabetes, and heart disease can interfere with mental function. Many of the lifestyle steps discussed here can help manage these conditions. Strive to eat a healthy diet, exercise regularly, get stress under control, and follow your doctor's advice for your condition.   Minimize Medications    Talk to your doctor about the medicines that you take. Some may be unnecessary. Also, healthy lifestyle habits may lower the need for certain drugs.     Last Reviewed:   April 2010 Brian Randall, MD   Updated: 03/11/2009          Advance Care Planning: Care Instructions  Overview     It can be hard to live with an illness that cannot be cured. But if your health is getting worse, you may want to make decisions about end-of-life care. Planning for the end of your life does not mean that you are giving up. It is a way to make sure that your wishes are met. Clearly stating your wishes can make it easier for your loved ones. Making plans while you are still able may also ease your mind and make your final days less stressful and more meaningful.  Follow-up care is a key part of your treatment and safety. Be sure to make and go to all appointments, and call your doctor if you are having problems. It's also a good idea to know your test results and keep a list of the medicines you take.  What can you do to plan for the end of life?  You can bring these issues up with your doctor. You do not need to wait until your doctor starts the conversation. You might start with, "What makes life worth living for me is. . ." And then follow it with, "I would not be willing to  live with . . ." When you complete this sentence it helps your doctor understand your wishes.  Talk openly and honestly with your doctor. This is the best way to understand the decisions you will need to make as your health changes. Know that you can always change your mind.  Ask your doctor about commonly used life-support measures. These include tube feedings, breathing machines, and fluids given through a vein (I.V.). Understanding these treatments will help you decide whether you want them.  You may choose to have these life-supporting treatments for a limited time. This allows a trial period to see whether they will help you. You may also decide that you want your doctor to take only certain measures to keep you alive. It may help to think about the big picture, like what makes life worth living for you or what your values and goals are.  Talk to your doctor about how long you are likely to live. Your doctor may be able to give you an idea of what usually happens with your specific illness.  Think about preparing papers that state your wishes. These papers are called advance directives. If you do this early and review them often, there will not be any confusion about what you want. You can change your instructions at any time.  Which papers should you prepare?  Advance directives are legal papers that tell doctors how you want to be cared for at the end of your life. You do not need a lawyer to write these papers. Ask your doctor or your state health department for information on how to write your advance directives. They may have the forms for each of these types of papers. Make sure your doctor has a copy of these on file, and give a copy to a family member or close friend.  Consider a do-not-resuscitate order (DNR). This order asks that no extra treatments be done if your heart stops or you stop breathing. Extra treatments may include cardiopulmonary resuscitation (CPR), electrical shock to restart your  heart, or a machine to breathe for you. If you decide to have a DNR order, ask your doctor to explain and write   it. Place the order in your home where everyone can easily see it.  Consider a living will. A living will explains your wishes about life support and other treatments at the end of your life if you become unable to speak for yourself. Living wills tell doctors to use or not use treatments that would keep you alive. You must have one or two witnesses or a notary present when you sign this form. A living will may be called something else in your state.  Consider a medical power of attorney. This form allows you to name a person to make decisions about your care if you are not able to. Most people ask a close friend or family member. Talk to this person about the kinds of treatments you want and those that you do not want. Make sure this person understands your wishes. A medical power of attorney may be called something else in your state.  These legal papers are simple to change. Tell your doctor what you want to change, and ask him or her to make a note in your medical file. Give your family updated copies of the papers.  Where can you learn more?  Go to https://www.healthwise.net/patientEd and enter P184 to learn more about "Advance Care Planning: Care Instructions."  Current as of: February 26, 2023Content Version: 13.8   2006-2023 Healthwise, Incorporated.   Care instructions adapted under license by [facility]. If you have questions about a medical condition or this instruction, always ask your healthcare professional. Healthwise, Incorporated disclaims any warranty or liability for your use of this information.           Learning About Living Wills  What is a living will?     A living will, also called a declaration, is a legal form. It tells your family and your doctor your wishes when you can't speak for yourself. It's used by the health professionals who will treat you as you near  the end of your life or if you get seriously hurt or ill.  If you put your wishes in writing, your loved ones and others will know what kind of care you want. They won't need to guess. This can ease your mind and be helpful to others. And you can change or cancel your living will at any time.  A living will is not the same as an estate or property will. An estate will explains what you want to happen with your money and property after you die.  How do you use it?  Keep these facts in mind about how a living will is used.  Your living will is used only if you can't speak or make decisions for yourself. Most often, one or more doctors must certify that you can't speak or decide for yourself before your living will takes effect.  If you get better and can speak for yourself again, you can accept or refuse any treatment. It doesn't matter what you said in your living will.  Some states may limit your right to refuse treatment in certain cases. For example, you may need to clearly state in your living will that you don't want artificial hydration and nutrition, such as being fed through a tube.  Is a living will a legal document?  A living will is a legal document. Each state has its own laws about living wills. And a living will may be called something else in your state.  Here are some things to know about living wills.    You don't need an attorney to complete a living will. But legal advice can be helpful if your state's laws are unclear. It can also help if your health history is complicated or your family can't agree on what should be in your living will.  You can change your living will at any time. Some people find that their wishes about end-of-life care change as their health changes. If you make big changes to your living will, complete a new form.  If you move to another state, make sure that your living will is legal in the state where you now live. In most cases, doctors will respect your wishes even if you  have a form from a different state.  You might use a universal form that has been approved by many states. This kind of form can sometimes be filled out and stored online. Your digital copy will then be available wherever you have a connection to the internet. The doctors and nurses who need to treat you can find it right away.  Your state may offer an online registry. This is another place where you can store your living will online.  It's a good idea to get your living will notarized. This means using a person called a notary public to watch two people sign, or witness, your living will.  What should you know when you create a living will?  Here are some questions to ask yourself as you make your living will.  Do you know enough about life support methods that might be used? If not, talk to your doctor so you know what might be done if you can't breathe on your own, your heart stops, or you can't swallow.  What things would you still want to be able to do after you receive life-support methods? Would you want to be able to walk? To speak? To eat on your own? To live without the help of machines?  Do you want certain religious practices performed if you become very ill?  If you have a choice, where do you want to be cared for? In your home? At a hospital or nursing home?  If you have a choice at the end of your life, where would you prefer to die? At home? In a hospital or nursing home? Somewhere else?  Would you prefer to be buried or cremated?  Do you want your organs to be donated after you die?  What should you do with your living will?  Make sure that your family members and your health care agent have copies of your living will (also called a declaration).  Give your doctor a copy of your living will. Ask to have it kept as part of your medical record. If you have more than one doctor, make sure that each one has a copy.  Put a copy of your living will where it can be easily found. For example, some people  may put a copy on their refrigerator door. If you are using a digital copy, be sure your doctor, family members, and health care agent know how to find and access it.  Where can you learn more?  Go to https://www.healthwise.net/patientEd and enter K356 to learn more about "Learning About Living Wills."  Current as of: March 26, 2023Content Version: 13.8   2006-2023 Healthwise, Incorporated.   Care instructions adapted under license by [facility]. If you have questions about a medical condition or this instruction, always ask your healthcare professional. Healthwise, Incorporated disclaims any   warranty or liability for your use of this information.           Learning About Medical Power of Attorney  What is a medical power of attorney?     A medical power of attorney, also called a durable power of attorney for health care, is one type of the legal forms called advance directives. It lets you name the person you want to make treatment decisions for you if you can't speak or decide for yourself. The person you choose is called your health care agent. This person is also called a health care proxy or health care surrogate.  A medical power of attorney may be called something else in your state.  How do you choose a health care agent?  Choose your health care agent carefully. This person may or may not be a family member.  Talk to the person before you make your final decision. Make sure this person is comfortable with this responsibility.  It's a good idea to choose someone who:  Is at least 50 years old.  Knows you well and understands what makes life meaningful for you.  Understands your religious and moral values.  Will do what you want, not what that person wants.  Will be able to make difficult choices at a stressful time.  Will be able to refuse or stop treatment, if that is what you would want, even if you could die.  Will be firm and confident with health professionals if needed.  Will ask  questions to get needed information.  Lives near you or agrees to travel to you if needed.  Your family may help you make medical decisions while you can still be part of that process. But it's important to choose one person to be your health care agent in case you aren't able to make decisions for yourself.  If you don't fill out the legal form and name a health care agent, the decisions your family can make may be limited.  A health care agent may be called something else in your state.  Who will make decisions for you if you don't have a health care agent?  If you don't have a health care agent or a living will, you may not get the care you want. Decisions may be made by family members who disagree about your medical care. Or decisions may be made by a medical professional who doesn't know you well. In some cases, a judge makes the decisions.  When you name a health care agent, it is very clear who has the power to make health decisions for you.  How do you name a health care agent?  You name your health care agent on a legal form. This form is usually called a medical power of attorney. Ask your hospital, state bar association, or office on aging where to find these forms.  You must sign the form to make it legal. Some states require you to get the form notarized. This means that a person called a notary public watches you sign the form and then the notary signs the form. Some states also require that two or more witnesses sign the form.  Be sure to tell your family members and doctors who your health care agent is.  Where can you learn more?  Go to https://www.healthwise.net/patientEd and enter P737 to learn more about "Learning About Medical Power of Attorney."  Current as of: February 26, 2023Content Version: 13.8   2006-2023 Healthwise, Incorporated.     Care instructions adapted under license by [facility]. If you have questions about a medical condition or this instruction, always ask your  healthcare professional. [place] any warranty or liability for your use of this information.

## 2022-08-26 NOTE — Progress Notes (Signed)
Well Adult Note  Name: Trevor Crosby ACZYS'A Date: 08/26/2022   MRN: [id number] Sex: Male   Age: 51 y.o. Ethnicity: Non-Hispanic / Non Latino   DOB: September 09, 1971 Race: Black / African American      [facility] is here for well adult exam.  History:  Wellness exam - Patient presents today for annual physical. Patient  reports feeling well. Patient has a normal appetite. Patient  eats 5+ servings of vegetables/fruits each day. Patient prepares food at home multiple times/day, eats in restaurants rarely. Patient  states that he sleeps 5-6 hours of  on average. In regards to emotional health, patient  denies depression or anxiety. Libido is considered to be normal. In regards to bowel habits, patient  has no complaints. Regarding urination, no complaints. Patient reports they feels safe at home, uses seat belts and has smoke detectors. Patient has a good work-life balance, and is happy with his job.     Health Maintenance    Annual retinal eye exam - Patient due  will need to schedule   Annual Dentist visit - Patient due  will need to schedule   Tobacco smoking  -  NO  Alcohol Misuse - NO  Illicit Drug Use- NO  Healthy Diet and physical activity -  YES  Obesity Screen- screened  08/25/2021  Wears seat belt- YES  End of life directives discussed with patient.-Mentions he does not have  a will/or/an advanced directives. Was instructed to start process looking into preparing his will an advanced directives.        Review of Systems   Constitutional:  Negative for activity change, appetite change, chills, fatigue, fever and unexpected weight change.   HENT:  Negative for congestion, ear pain, postnasal drip, sinus pressure, sore throat, tinnitus and trouble swallowing.    Eyes:  Negative for pain and redness.   Respiratory:  Negative for cough, chest tightness, shortness of breath and wheezing.    Cardiovascular:  Negative for chest pain, palpitations and leg swelling.   Gastrointestinal:  Negative for abdominal distention,  abdominal pain, blood in stool, constipation, diarrhea, nausea and vomiting.   Endocrine: Negative for cold intolerance, heat intolerance and polydipsia.   Genitourinary:  Negative for decreased urine volume, dysuria, flank pain, frequency, hematuria and urgency.   Musculoskeletal:  Negative for arthralgias, back pain, joint swelling, neck pain and neck stiffness.   Skin:  Negative for color change and rash.   Neurological:  Negative for dizziness, weakness, numbness and headaches.   Hematological:  Negative for adenopathy.   Psychiatric/Behavioral:  Negative for behavioral problems, sleep disturbance and suicidal ideas. The patient is not nervous/anxious.        No Known Allergies      Prior to Visit Medications    Medication Sig Taking? Authorizing Provider   Probiotic Product (PROBIOTIC DAILY PO) Take by mouth Yes Historical Provider, MD   Multiple Vitamin (ONE-A-DAY MENS) TABS Take 1 tablet by mouth daily Yes Historical Provider, MD       History reviewed. No pertinent past medical history.    Past Surgical History:   Procedure Laterality Date    ABDOMINAL MASS RESECTION N/A 08/10/2022    COLONOSCOPY  2016 probably    Most likely -No polyps    CT BIOPSY ABDOMEN RETROPERITONEUM  06/23/2022    CT BIOPSY ABDOMEN RETROPERITONEUM 06/23/2022 [facility] CT SCAN    FACIAL COSMETIC SURGERY      fx facial bone ; fracture with basketball injury  Family History   Problem Relation Age of Onset    Colon Cancer Mother 66    High Blood Pressure Father        Social History     Tobacco Use    Smoking status: Never    Smokeless tobacco: Never   Vaping Use    Vaping Use: Never used   Substance Use Topics    Alcohol use: Yes     Alcohol/week: 2.0 standard drinks of alcohol     Types: 2 Cans of beer per week    Drug use: Never       Objective     Vital Signs  BP 118/76 (Site: Left Upper Arm, Position: Sitting, Cuff Size: Large Adult)   Pulse 80   Temp 98.4 F (36.9 C) (Oral)   Resp 15   Ht '5\' 9"'$  (1.753 m)   Wt 139 lb 3.2 oz  (63.1 kg)   SpO2 99%   BMI 20.56 kg/m   Wt Readings from Last 3 Encounters:   08/26/22 139 lb 3.2 oz (63.1 kg)   07/02/22 162 lb (73.5 kg)   06/22/22 159 lb (72.1 kg)       Waist Circumference  There were no vitals filed for this visit.    Physical Exam  Constitutional:       General: He is not in acute distress.     Appearance: Normal appearance. He is not ill-appearing.   HENT:      Head: Normocephalic and atraumatic.      Right Ear: Tympanic membrane, ear canal and external ear normal. There is no impacted cerumen.      Left Ear: Tympanic membrane, ear canal and external ear normal. There is no impacted cerumen.      Mouth/Throat:      Mouth: Mucous membranes are moist.      Pharynx: No oropharyngeal exudate or posterior oropharyngeal erythema.   Eyes:      Extraocular Movements: Extraocular movements intact.      Conjunctiva/sclera: Conjunctivae normal.      Pupils: Pupils are equal, round, and reactive to light.   Neck:      Vascular: No carotid bruit.   Cardiovascular:      Rate and Rhythm: Normal rate and regular rhythm.      Pulses: Normal pulses.      Heart sounds: Normal heart sounds. No murmur heard.     No gallop.   Pulmonary:      Effort: Pulmonary effort is normal.      Breath sounds: Normal breath sounds. No wheezing, rhonchi or rales.   Abdominal:      General: Abdomen is flat. Bowel sounds are normal. There is no distension.      Palpations: Abdomen is soft.      Tenderness: There is no abdominal tenderness. There is no guarding or rebound.   Musculoskeletal:         General: No swelling or tenderness. Normal range of motion.      Cervical back: No tenderness.   Lymphadenopathy:      Cervical: No cervical adenopathy.   Skin:     Findings: No erythema, lesion or rash.   Neurological:      General: No focal deficit present.      Mental Status: He is alert and oriented to person, place, and time. Mental status is at baseline.      Cranial Nerves: No cranial nerve deficit.      Motor: No weakness.  Psychiatric:         Mood and Affect: Mood normal.         Behavior: Behavior normal.         Thought Content: Thought content normal.         Judgment: Judgment normal.         Assessment   Plan   1. Encounter for well adult exam without abnormal findings  Assessment & Plan:   Patient comes in for wellness exam   we discussed age appropriate USPSTF screens  Over 75% of the visit was spent counselling patient on appropriate lifestyle choices.     Orders:  -     CBC with Auto Differential; Future  -     Comprehensive Metabolic Panel; Future  -     Hemoglobin A1C; Future  -     Lipid Panel; Future  -     TSH with Reflex; Future  -     Vitamin D 25 Hydroxy; Future  -     PSA Screening  2. 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  He will have an appointment with his oncologist 09/01/22 to determine treatment moving forward  Orders:  -     CBC with Auto Differential; Future  -     Comprehensive Metabolic Panel; Future  3. H/O right nephrectomy  Assessment & Plan:   S/p right nephrectomy.  Retroperitoneal mass was wrapped around the right kidney.  We will obtain routine chemistries renal function today  Orders:  -     Comprehensive Metabolic Panel; Future  4. Vitamin D deficiency  -     Vitamin D 25 Hydroxy; Future  5. Other fatigue  -     TSH with Reflex; Future  6. Impaired glucose regulation  -     CBC with Auto Differential; Future  -     Comprehensive Metabolic Panel; Future  -     Hemoglobin A1C; Future  7. Screening cholesterol level  -     Lipid Panel; Future  8. Screening PSA (prostate specific antigen)  -     PSA Screening         Personalized Preventive Plan   Current Health Maintenance Status  Immunization History   Administered Date(s) Administered    TDaP, ADACEL (age 2y-64y), [place] (age 10y+), IM, 0.44m 03/06/2010,  08/25/2021        Health Maintenance   Topic Date Due    Hepatitis B vaccine (1 of 3 - 3-dose series) Never done    COVID-19 Vaccine (1) Never done    Shingles vaccine (1 of 2) Never done    Flu vaccine (1) Never done    Depression Screen  06/23/2023    Colorectal Cancer Screen  12/27/2024    Lipids  08/26/2026    DTaP/Tdap/Td vaccine (4 - Td or Tdap) 08/26/2031    Hepatitis C screen  Completed    HIV screen  Completed    Hepatitis A vaccine  Aged Out    Hib vaccine  Aged Out    Meningococcal (ACWY) vaccine  Aged Out    Pneumococcal 0-64 years Vaccine  Aged Out    Diabetes screen  Discontinued     Recommendations for PNorthwest AirlinesDue: see orders and patient instructions/AVS.    Return in about 1 month (around 09/25/2022) for  Routine follow-up, 15-minutes .

## 2022-08-26 NOTE — Assessment & Plan Note (Signed)
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  He will have an appointment with his oncologist 09/01/22 to determine treatment moving forward

## 2022-08-26 NOTE — Assessment & Plan Note (Signed)
Patient comes in for wellness exam   we discussed age appropriate USPSTF screens  Over 75% of the visit was spent counselling patient on appropriate lifestyle choices.

## 2022-08-27 LAB — CBC WITH AUTO DIFFERENTIAL
Basophils %: 1.2 %
Basophils Absolute: 0.1 10*3/uL (ref 0.0–0.2)
Eosinophils %: 12.4 %
Eosinophils Absolute: 0.5 10*3/uL (ref 0.0–0.6)
Hematocrit: 35.1 % — ABNORMAL LOW (ref 40.5–52.5)
Hemoglobin: 11.7 g/dL — ABNORMAL LOW (ref 13.5–17.5)
Lymphocytes %: 18.5 %
Lymphocytes Absolute: 0.8 10*3/uL — ABNORMAL LOW (ref 1.0–5.1)
MCH: 27.5 pg (ref 26.0–34.0)
MCHC: 33.3 g/dL (ref 31.0–36.0)
MCV: 82.6 fL (ref 80.0–100.0)
MPV: 8.4 fL (ref 5.0–10.5)
Monocytes %: 7.7 %
Monocytes Absolute: 0.3 10*3/uL (ref 0.0–1.3)
Neutrophils %: 60.2 %
Neutrophils Absolute: 2.5 10*3/uL (ref 1.7–7.7)
Platelets: 421 10*3/uL (ref 135–450)
RBC: 4.25 M/uL (ref 4.20–5.90)
RDW: 17.3 % — ABNORMAL HIGH (ref 12.4–15.4)
WBC: 4.2 10*3/uL (ref 4.0–11.0)

## 2022-08-27 LAB — LIPID PANEL
Cholesterol, Total: 229 mg/dL — ABNORMAL HIGH (ref 0–199)
HDL: 80 mg/dL — ABNORMAL HIGH (ref 40–60)
LDL Calculated: 138 mg/dL — ABNORMAL HIGH (ref <100)
Triglycerides: 56 mg/dL (ref 0–150)
VLDL Cholesterol Calculated: 11 mg/dL

## 2022-08-27 LAB — COMPREHENSIVE METABOLIC PANEL
ALT: 26 U/L (ref 10–40)
AST: 18 U/L (ref 15–37)
Albumin/Globulin Ratio: 1.1 (ref 1.1–2.2)
Albumin: 4.1 g/dL (ref 3.4–5.0)
Alkaline Phosphatase: 163 U/L — ABNORMAL HIGH (ref 40–129)
Anion Gap: 10 (ref 3–16)
BUN: 19 mg/dL (ref 7–20)
CO2: 28 mmol/L (ref 21–32)
Calcium: 9.4 mg/dL (ref 8.3–10.6)
Chloride: 103 mmol/L (ref 99–110)
Creatinine: 1.2 mg/dL (ref 0.9–1.3)
Est, Glom Filt Rate: >60 (ref >60)
Glucose: 99 mg/dL (ref 70–99)
Potassium: 4.6 mmol/L (ref 3.5–5.1)
Sodium: 141 mmol/L (ref 136–145)
Total Bilirubin: 0.3 mg/dL (ref 0.0–1.0)
Total Protein: 7.8 g/dL (ref 6.4–8.2)

## 2022-08-27 LAB — HEMOGLOBIN A1C
Hemoglobin A1C: 5.5 %
eAG: 111.2 mg/dL

## 2022-08-27 LAB — TSH WITH REFLEX: TSH: 2.74 u[IU]/mL (ref 0.27–4.20)

## 2022-08-27 LAB — VITAMIN D 25 HYDROXY: Vit D, 25-Hydroxy: 23.4 ng/mL — ABNORMAL LOW (ref >=30)

## 2022-08-27 NOTE — Other (Signed)
Please let patient know that their LDL/Badcholesterol is 138. His LDL should be under 100, his HDL good cholesterol CAD it should be under 80.  LDL and HDL within the normal limits will lower the incidence/risk of CVD which includes heart attack and stroke.his 10 year cardiovascular disease risk score (Which is the % risk of having a cardiac event or stroke in the next 10 years) is  4.2%.     Patient should be able to treat this with a  low fat diet-less cheese/butter, fried food, fast food, increase intake of green/salads/fiber/oatmeal for breakfast, an exercise.    Patient Vit D is low at 23.4. Patient could get OTC Vit D  daily.     Patient has 1 result pending if he does not hear from [place] that means it was negative.

## 2022-08-27 NOTE — Progress Notes (Signed)
The 10-year ASCVD risk score (Arnett DK, et al., 2019) is: 4.2%    Values used to calculate the score:      Age: 51 years      Sex: Male      Is Non-Hispanic African American: Yes      Diabetic: No      Tobacco smoker: No      Systolic Blood Pressure: 841 mmHg      Is BP treated: No      HDL Cholesterol: 80 mg/dL      Total Cholesterol: 229 mg/dL
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
## 2022-09-15 NOTE — Progress Notes (Signed)
Formatting of this note is different from the original.  Patient seen independently of APP    I have seen and examined this patient independently. I have reviewed the patient's vital signs, nursing notes, review of systems, medications, physical exam findings, laboratory tests, pertinent radiographic imaging, and problem list. I have read and edited the above note to reflect the details of my interview, exam, and medical decision making. I also have spoken with the patient and answered all the patient's questions to the best of my ability. I agree with the assessment and plan as written above.    Trevor Crosby is a 51 y.o. male originally from [place], currently residing in [place], [place].  He reports an 8-9 month history of progressive abdominal distention with synchronous 10 lb weight loss, fatigue.  He was evaluated by his PCP, and was admitted to [facility]; CT abdomen/pelvis on 06/22/2022 demonstrated a 30 cm retroperitoneal mass.  CT guided biopsy on 06/23/2022 was positive for WD LPS. On 08/10/2022, he underwent resection of 38.5 cm predominantly DD LPS (grade 2) with a small component of WD LPS (up to 10%) with a focal area with positive margins; he did also have a R nephrectomy and R adrenalectomy.  On exam today, his surgical incision is healing well.  He has no abdominal distention or palpable mass.  Respirations are clear and unlabored.  ECOG PS 0.    I discussed the natural history of WD/DD LPS with him at length.  In particular, we reviewed that he is considered high-risk for recurrence per existing nomograms (SARCULATOR: 37% 7-yr DFS).  I discussed that there is no universal approach to adjuvant therapy in high-risk G2 DD LPS given existing data with cytotoxic chemotherapy in STS (c.f., Lancet Oncol. 2012 Oct;13(10):1045-54; J Clin Oncol 36, 2018 (suppl; abstr 01601)).  I would not recommend adjuvant cytotoxic chemotherapy in his circumstance.  We did review that WD/DD LPS are characterized by  frequent MDM2 and CDK4 amplifications.  I reviewed our institutional experience with CDK4i in high-risk WD/DD LPS, which has been associated with a trend to fewer surgeries at 1-yr and 2-yrs from initiation of adjuvant therapy (c.f., Blanche East., et al., Ann Surg Oncol.2023 Nov;30(12):7876-7881.)    Information on palbociclib was reviewed with him.  He will consider (1) close radiographic surveillance vs (2) adjuvant palbociclib 125 mg PO daily d1-21 q28d x 48yr.  He is set up to meet with Trevor Crosby this Friday; if he elects adjuvant palbociclib, he will work with Dr. MDennison Nancyto obtain.  I recommended consideration of NGS testing of his tumor as well; he will research further and will discuss with Dr. MDennison Nancyif he wishes to proceed.    Plan on return in 3 months for routine staging scans and for a joint visit with Trevor Crosby   Thank you for the opportunity to participate in the care of Trevor Crosby    Trevor CharLVenda Rodes MD  Attending Physician, Medical Oncology    Electronically signed by DGlenetta Borg MD at 09/15/2022  5:49 PM EDT

## 2022-09-15 NOTE — Progress Notes (Signed)
Formatting of this note might be different from the original.  Met in-person with patient [facility] for checkout after seeing Dr. Venda Rodes. The following appointments were discussed.    Scans: scans already scheduled   Lab: no labs   Clinic Visit: follow up with Dr. Venda Rodes same day as Dr. Scot Dock on 12/08/22 at 10:40am  Referral: none   Chemo: none     Patient has verbalized understanding of the above appointments. Patient has been provided a printed schedule.  Electronically signed by Junius Creamer at 09/15/2022  3:09 PM EDT

## 2022-09-30 ENCOUNTER — Encounter
Admit: 2022-09-30 | Discharge: 2022-09-30 | Payer: PRIVATE HEALTH INSURANCE | Attending: Internal Medicine | Primary: Internal Medicine

## 2022-09-30 DIAGNOSIS — C499 Malignant neoplasm of connective and soft tissue, unspecified: Secondary | ICD-10-CM

## 2022-09-30 NOTE — Progress Notes (Signed)
Trevor Crosby ([birth year]) is a 51 y.o. male   Follow-up     General health:  This patient presents for check up and refills. The problem and medicine lists and chart were reviewed in detail. The patient has been worked up and treated for this/these condition/s and is compliant with taking the medication without any significant side effects. The patient's condition/s is/are chronic and unchanged and otherwise remains stable. Feels well with minor complaints.    Main Problem Review - Liposarcoma -   Patient s/p resection of retroperitoneal mass working diagnosis liposarcoma (from first pathology report tissue biopsy) on 08/10/22. Patient saw [facility] oncologist 09/15/22-  Recommended adjuvant therapy for 2 years with palbociclib [facility]).   Patient will follow-up with [facility] oncologist Dr. Dennison Nancy 09/30/22 for orientation on taking Ibrance.  Patient today patient feels fine, he is up to about 85% energy levels.  Has no other complaints or symptoms today.      The 10-year ASCVD risk score (Arnett DK, et al., 2019) is: 3.1%    Values used to calculate the score:      Age: 51 years      Sex: Male      Is Non-Hispanic African American: Yes      Diabetic: No      Tobacco smoker: No      Systolic Blood Pressure: 244 mmHg      Is BP treated: No      HDL Cholesterol: 80 mg/dL      Total Cholesterol: 229 mg/dL     Review of Systems   Constitutional:  Negative for activity change, appetite change, chills, fatigue, fever and unexpected weight change.   HENT:  Negative for congestion, ear pain, postnasal drip, sinus pressure, sore throat, tinnitus and trouble swallowing.    Eyes:  Negative for pain and redness.   Respiratory:  Negative for cough, chest tightness, shortness of breath and wheezing.    Cardiovascular:  Negative for chest pain, palpitations and leg swelling.   Gastrointestinal:  Negative for abdominal distention, abdominal pain, blood in stool, constipation, diarrhea, nausea and vomiting.   Endocrine: Negative for cold  intolerance, heat intolerance and polydipsia.   Genitourinary:  Negative for decreased urine volume, dysuria, flank pain, frequency, hematuria and urgency.   Musculoskeletal:  Negative for arthralgias, back pain, joint swelling, neck pain and neck stiffness.   Skin:  Negative for color change and rash.   Neurological:  Negative for dizziness, weakness, numbness and headaches.   Hematological:  Negative for adenopathy.   Psychiatric/Behavioral:  Negative for behavioral problems, sleep disturbance and suicidal ideas. The patient is not nervous/anxious.         BP 100/64 (Site: Left Upper Arm, Position: Sitting, Cuff Size: Medium Adult)   Pulse 82   Resp 14   Ht 1.753 m ('5\' 9"'$ )   Wt 68.5 kg (151 lb)   SpO2 98%   BMI 22.30 kg/m    Physical Exam  Constitutional:       General: He is not in acute distress.     Appearance: Normal appearance. He is not ill-appearing.   HENT:      Head: Normocephalic and atraumatic.      Right Ear: Ear canal normal.      Left Ear: Ear canal normal.      Mouth/Throat:      Mouth: Mucous membranes are moist.      Pharynx: No oropharyngeal exudate or posterior oropharyngeal erythema.   Eyes:      Extraocular  Movements: Extraocular movements intact.      Conjunctiva/sclera: Conjunctivae normal.      Pupils: Pupils are equal, round, and reactive to light.   Neck:      Vascular: No carotid bruit.   Cardiovascular:      Rate and Rhythm: Normal rate and regular rhythm.      Pulses: Normal pulses.      Heart sounds: Normal heart sounds. No murmur heard.     No gallop.   Pulmonary:      Effort: Pulmonary effort is normal.      Breath sounds: Normal breath sounds. No wheezing, rhonchi or rales.   Abdominal:      General: Abdomen is flat. There is no distension.      Tenderness: There is no abdominal tenderness. There is no guarding.   Musculoskeletal:         General: No swelling or tenderness. Normal range of motion.      Cervical back: No tenderness.   Lymphadenopathy:      Cervical: No  cervical adenopathy.   Skin:     Findings: No erythema, lesion or rash.   Neurological:      General: No focal deficit present.      Mental Status: He is alert and oriented to person, place, and time. Mental status is at baseline.      Cranial Nerves: No cranial nerve deficit.      Motor: No weakness.   Psychiatric:         Mood and Affect: Mood normal.         Behavior: Behavior normal.         Thought Content: Thought content normal.         Judgment: Judgment normal.         ASSESSMENT/PLAN:  1. Liposarcoma [facility])  Assessment & Plan:   Patient s/p resection of retroperitoneal mass working diagnosis liposarcoma (from first pathology report tissue biopsy) on 08/10/22  Patient saw [facility] oncologist 09/15/22-  Recommended  adjuvant therapy for 2 years with palbociclib [facility]).   Patient will follow-up with [facility] oncologist Dr. Dennison Nancy 09/30/22 for orientation on taking Ibrance  Follow-up with specialist per their orders    Preventative care discussed.  Encouraged healthy diet choices, reg exercise. Patient agreed w/plan and verbal understanding. Patient advised to call or return with any concerns or problems or worsening conditions. Go to the ER for any severe or potentially life threatening problems.    Return in about 3 months (around 12/31/2022) for Routine follow-up, 15-minutes .     This note was generated in part or in whole with voice recognition software.  Voice recognition is usually quite accurate but there are transcription errors that can often occur.  All attempts were made to correct these errors I apologized for any  typographical errors that were not detected and corrected.     Electronically signed by Michaela Corner. Gilford Rile, MD on 09/30/2022 at 9:46 AM.

## 2022-09-30 NOTE — Assessment & Plan Note (Addendum)
Patient s/p resection of retroperitoneal mass working diagnosis liposarcoma (from first pathology report tissue biopsy) on 08/10/22  Patient saw [facility] oncologist 09/15/22-  Recommended  adjuvant therapy for 2 years with palbociclib [facility]).   Patient will follow-up with [facility] oncologist Dr. Dennison Nancy 09/30/22 for orientation on taking Ibrance  Follow-up with specialist per their orders
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
## 2022-12-15 NOTE — Progress Notes (Signed)
Formatting of this note might be different from the original.  Follow up on final read from most recent imaging. Continue with recommendations as discussed during appointment. Confirmed results released to Mychart.     Electronically signed by Tressie Stalker, APRN-CNP at 12/15/2022  2:43 PM EST

## 2022-12-31 ENCOUNTER — Ambulatory Visit
Admit: 2022-12-31 | Discharge: 2022-12-31 | Payer: PRIVATE HEALTH INSURANCE | Attending: Internal Medicine | Primary: Internal Medicine

## 2022-12-31 DIAGNOSIS — C499 Malignant neoplasm of connective and soft tissue, unspecified: Secondary | ICD-10-CM

## 2022-12-31 NOTE — Assessment & Plan Note (Signed)
patient takes daily supplements.  Will recheck vitamin D level

## 2022-12-31 NOTE — Progress Notes (Signed)
Trevor Crosby (08-30-1971) is a 52 y.o. male   Liposarcoma and Cholesterol Problem     General health:  This patient presents for check up and refills. The problem and medicine lists and chart were reviewed in detail. The patient has been worked up and treated for this/these condition/s and is compliant with taking the medication without any significant side effects. The patient's condition/s is/are chronic and unchanged and otherwise remains stable. Feels well with minor complaints.    Main Problem Review - Liposarcoma -   Patient s/p resection of retroperitoneal mass working diagnosis liposarcoma (from first pathology report tissue biopsy) on 08/10/22. Patient saw Trevor Crosby oncologist 09/15/22-  Recommended adjuvant therapy for 2 years with palbociclib Trevor Crosby).   Patient will follow-up with [facility] oncologist Trevor Crosby an [place] [facility] Trevor Crosby.  Patient today patient feels fine, he is back at the gym.  Has no other complaints or symptoms today.    Other issues discussed    HLD  - Patient denies complaints or symptoms of chest pain/shortness of breath with exertion or at rest.   Patient follows a low-fat diet, tries to exercise.     Health Maintenance    Annual retinal eye exam - Patient due  will need to schedule   Annual Dentist visit - Patient due  will need to schedule   Tobacco smoking  -  NO  Alcohol Misuse - NO  Illicit Drug Use- NO  Healthy Diet and physical activity -  YES  Obesity Screen- screened  08/25/2021  Wears seat belt- YES  End of life directives discussed with patient.-Mentions he does not have  a will/or/an advanced directives. Was instructed to start process looking into preparing his will an advanced directives.    The 10-year ASCVD risk score (Arnett DK, et al., 2019) is: 3.3%    Values used to calculate the score:      Age: 39 years      Sex: Male      Is Non-Hispanic African American: Yes      Diabetic: No      Tobacco smoker: No      Systolic Blood Pressure: 062 mmHg      Is BP treated:  No      HDL Cholesterol: 80 mg/dL      Total Cholesterol: 229 mg/dL     Review of Systems   Constitutional:  Negative for activity change, appetite change, chills, fatigue, fever and unexpected weight change.   HENT:  Negative for congestion, ear pain, postnasal drip, sinus pressure, sore throat, tinnitus and trouble swallowing.    Eyes:  Negative for pain and redness.   Respiratory:  Negative for cough, chest tightness, shortness of breath and wheezing.    Cardiovascular:  Negative for chest pain, palpitations and leg swelling.   Gastrointestinal:  Negative for abdominal distention, abdominal pain, blood in stool, constipation, diarrhea, nausea and vomiting.   Endocrine: Negative for cold intolerance, heat intolerance and polydipsia.   Genitourinary:  Negative for decreased urine volume, dysuria, flank pain, frequency, hematuria and urgency.   Musculoskeletal:  Negative for arthralgias, back pain, joint swelling, neck pain and neck stiffness.   Skin:  Negative for color change and rash.   Neurological:  Negative for dizziness, weakness, numbness and headaches.   Hematological:  Negative for adenopathy.   Psychiatric/Behavioral:  Negative for behavioral problems, sleep disturbance and suicidal ideas. The patient is not nervous/anxious.         BP 104/74 (Site: Left Upper Arm, Position:  Sitting, Cuff Size: Medium Adult)   Pulse 72   Resp 14   Ht 1.753 m ('5\' 9"'$ )   Wt 73.6 kg (162 lb 3.2 oz)   SpO2 97%   BMI 23.95 kg/m    Physical Exam  Constitutional:       General: He is not in acute distress.     Appearance: Normal appearance. He is not ill-appearing.   HENT:      Head: Normocephalic and atraumatic.      Right Ear: Ear canal normal.      Left Ear: Ear canal normal.      Mouth/Throat:      Mouth: Mucous membranes are moist.      Pharynx: No oropharyngeal exudate or posterior oropharyngeal erythema.   Eyes:      Extraocular Movements: Extraocular movements intact.      Conjunctiva/sclera: Conjunctivae normal.       Pupils: Pupils are equal, round, and reactive to light.   Neck:      Vascular: No carotid bruit.   Cardiovascular:      Rate and Rhythm: Normal rate and regular rhythm.      Pulses: Normal pulses.      Heart sounds: Normal heart sounds. No murmur heard.     No gallop.   Pulmonary:      Effort: Pulmonary effort is normal.      Breath sounds: Normal breath sounds. No wheezing, rhonchi or rales.   Abdominal:      General: Abdomen is flat. There is no distension.   Musculoskeletal:         General: No swelling or tenderness. Normal range of motion.      Cervical back: No tenderness.   Lymphadenopathy:      Cervical: No cervical adenopathy.   Skin:     Findings: No erythema, lesion or rash.   Neurological:      General: No focal deficit present.      Mental Status: He is alert and oriented to person, place, and time. Mental status is at baseline.      Cranial Nerves: No cranial nerve deficit.      Motor: No weakness.   Psychiatric:         Mood and Affect: Mood normal.         Behavior: Behavior normal.         Thought Content: Thought content normal.         Judgment: Judgment normal.         ASSESSMENT/PLAN:  1. Liposarcoma [facility])  Assessment & Plan:   Patient s/p resection of retroperitoneal mass working diagnosis liposarcoma (from first pathology report tissue biopsy) on 08/10/22  Patient saw [facility] oncologist 09/15/22-  Recommended  adjuvant therapy for 2 years with palbociclib Trevor Crosby).   Patient will follow-up with [facility] oncologist Trevor Crosby     Patient saw [facility] oncologist Dr. Dema Severin -  "- We do recommend per our experience adjuvant therapy for 2 years with palbociclib.   - He started Ibrance on 10/11/23 with Trevor Crosby - did have to hold for 5 weeks after C1 at '125mg'$  daily for prolonged neutropenia. Resumed on 12/05/22 at '75mg'$  daily d1-21 every 28 days. Agree with plan.  - Restaging scans today (12/08/22) personally reviewed showed no evidence of recurrent disease."    Patient will follow-up with oncologist  per their orders  2. H/O right nephrectomy  Assessment & Plan:   S/p right nephrectomy.  Retroperitoneal mass was wrapped around  the right kidney.  We will obtain routine chemistries renal function today  Orders:  -     Renal Function Panel; Future  3. Pure hypercholesterolemia  Assessment & Plan:   Stable.  Treats with lifestyle changes low-fat diet an exercise.  Will recheck fasting lipid panel.  Orders:  -     Lipid Panel; Future  4. Vitamin D deficiency  Assessment & Plan:    patient takes daily supplements.  Will recheck vitamin D level  Orders:  -     Vitamin D 25 Hydroxy; Future    Preventative care discussed.  Encouraged healthy diet choices, reg exercise. Patient agreed w/plan and verbal understanding. Patient advised to call or return with any concerns or problems or worsening conditions. Go to the ER for any severe or potentially life threatening problems.    Return in about 4 months (around 05/01/2023) for Routine follow-up, 15-minutes .     This note was generated in part or in whole with voice recognition software.  Voice recognition is usually quite accurate but there are transcription errors that can often occur.  All attempts were made to correct these errors I apologized for any  typographical errors that were not detected and corrected.     Electronically signed by Michaela Corner. Gilford Rile, MD on 12/31/2022 at 9:35 AM.

## 2022-12-31 NOTE — Assessment & Plan Note (Addendum)
Patient s/p resection of retroperitoneal mass working diagnosis liposarcoma (from first pathology report tissue biopsy) on 08/10/22  Patient saw [facility] oncologist 09/15/22-  Recommended  adjuvant therapy for 2 years with palbociclib Leslee Home).   Patient will follow-up with [facility] oncologist Dr. Dennison Nancy     Patient saw [facility] oncologist Dr. Dema Severin -  "- We do recommend per our experience adjuvant therapy for 2 years with palbociclib.   - He started Ibrance on 10/11/23 with Dr. Dennison Nancy - did have to hold for 5 weeks after C1 at '125mg'$  daily for prolonged neutropenia. Resumed on 12/05/22 at '75mg'$  daily d1-21 every 28 days. Agree with plan.  - Restaging scans today (12/08/22) personally reviewed showed no evidence of recurrent disease."    Patient will follow-up with oncologist per their orders

## 2022-12-31 NOTE — Assessment & Plan Note (Signed)
S/p right nephrectomy.  Retroperitoneal mass was wrapped around the right kidney.  We will obtain routine chemistries renal function today

## 2022-12-31 NOTE — Assessment & Plan Note (Signed)
Stable.  Treats with lifestyle changes low-fat diet an exercise.  Will recheck fasting lipid panel.

## 2023-01-07 ENCOUNTER — Encounter

## 2023-01-08 LAB — RENAL FUNCTION PANEL
Albumin: 4.2 g/dL (ref 3.4–5.0)
Anion Gap: 10 (ref 3–16)
BUN: 16 mg/dL (ref 7–20)
CO2: 26 mmol/L (ref 21–32)
Calcium: 9.3 mg/dL (ref 8.3–10.6)
Chloride: 105 mmol/L (ref 99–110)
Creatinine: 1.4 mg/dL — ABNORMAL HIGH (ref 0.9–1.3)
Est, Glom Filt Rate: >60 (ref >60)
Glucose: 84 mg/dL (ref 70–99)
Phosphorus: 3.5 mg/dL (ref 2.5–4.9)
Potassium: 4.7 mmol/L (ref 3.5–5.1)
Sodium: 141 mmol/L (ref 136–145)

## 2023-01-08 LAB — LIPID PANEL
Cholesterol, Total: 186 mg/dL (ref 0–199)
HDL: 64 mg/dL — ABNORMAL HIGH (ref 40–60)
LDL Calculated: 112 mg/dL — ABNORMAL HIGH (ref <100)
Triglycerides: 51 mg/dL (ref 0–150)
VLDL Cholesterol Calculated: 10 mg/dL

## 2023-01-08 LAB — VITAMIN D 25 HYDROXY: Vit D, 25-Hydroxy: 21.7 ng/mL — ABNORMAL LOW (ref >=30)

## 2023-01-10 ENCOUNTER — Encounter

## 2023-01-10 MED ORDER — VITAMIN D3 50 MCG (2000 UT) PO TABS
50 MCG (2000 UT) | ORAL_TABLET | Freq: Every day | ORAL | 3 refills | Status: DC
Start: 2023-01-10 — End: 2024-02-27

## 2023-01-10 NOTE — Other (Signed)
Please let patient know that their LDL/Badcholesterol is 112.  His LDL should be under 100 to lower the incidence/risk of CVD which includes heart attack and stroke.his 10 year cardiovascular disease risk score (Which is the % risk of having a cardiac event or stroke in the next 10 years) is  3.3%.    Patient should be able to treat this with a  low fat diet-less cheese/butter, fried food, fast food, increase intake of green/salads/fiber/oatmeal for breakfast, an exercise.    Please let patient know creatinine has gone up to 1.4.  Please find out if by chance he uses creatine supplements before after gym workouts?    Patient Vit D is low at 21.7. I have sent a prescription for vitamin D 2000 units daily to their pharmacy.      Please  advise patient  to take with largest meal since it is fat-soluble.

## 2023-01-10 NOTE — Progress Notes (Signed)
.    The 10-year ASCVD risk score (Arnett DK, et al., 2019) is: 3.3%    Values used to calculate the score:      Age: 52 years      Sex: Male      Is Non-Hispanic African American: Yes      Diabetic: No      Tobacco smoker: No      Systolic Blood Pressure: 098 mmHg      Is BP treated: No      HDL Cholesterol: 64 mg/dL      Total Cholesterol: 186 mg/dL

## 2023-05-06 ENCOUNTER — Encounter

## 2023-05-06 ENCOUNTER — Encounter
Admit: 2023-05-06 | Discharge: 2023-05-06 | Payer: PRIVATE HEALTH INSURANCE | Attending: Internal Medicine | Primary: Internal Medicine

## 2023-05-06 DIAGNOSIS — C499 Malignant neoplasm of connective and soft tissue, unspecified: Secondary | ICD-10-CM

## 2023-05-06 NOTE — Assessment & Plan Note (Signed)
Patient s/p resection of retroperitoneal mass working diagnosis liposarcoma (from first pathology report tissue biopsy) on 08/10/22  Patient saw [facility] oncologist 09/15/22-  Recommended  adjuvant therapy for 2 years with palbociclib Ilda Foil).   Patient will follow-up with [facility] oncologist Dr. Johny Blamer     Patient saw [facility] oncologist Dr. Hyman Bible -  "- We do recommend per our experience adjuvant therapy for 2 years with palbociclib.   - He started Ibrance on 10/11/23 with Dr. Johny Blamer - did have to hold for 5 weeks after C1 at 125mg  daily for prolonged neutropenia. Resumed on 12/05/22 at 75mg  daily d1-21 every 28 days. Agree with plan.  - Restaging scans today (12/08/22) personally reviewed showed no evidence of recurrent disease."    Patient will follow-up with oncologist per their orders

## 2023-05-06 NOTE — Assessment & Plan Note (Signed)
Stable.  Treats with lifestyle changes low-fat diet an exercise.  Will recheck fasting lipid panel.

## 2023-05-06 NOTE — Assessment & Plan Note (Signed)
S/p right nephrectomy.  Retroperitoneal mass was wrapped around the right kidney.  We will obtain routine chemistries renal function today

## 2023-05-06 NOTE — Assessment & Plan Note (Signed)
patient takes daily supplements.  Will recheck vitamin D level

## 2023-05-06 NOTE — Assessment & Plan Note (Signed)
Will get Testosterone  levels

## 2023-05-06 NOTE — Progress Notes (Signed)
Trevor Crosby (April 29, 1971) is a 52 y.o. male   Follow-up     General health:  This patient presents for check up and refills. The problem and medicine lists and chart were reviewed in detail. The patient has been worked up and treated for this/these condition/s and is compliant with taking the medication without any significant side effects. The patient's condition/s is/are chronic and unchanged and otherwise remains stable. Feels well with minor complaints.    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 Patient saw [facility] oncologist 09/15/22-  Recommended adjuvant therapy for 2 years with palbociclib Ilda Foil).   Patient will follow-up with [facility] oncologist Dr. Johny Blamer an [facility] Oncologist Dr. Donnella Bi.  Patient today patient feels fine, he is back at the gym.  Has no other complaints or symptoms today.     Other issues discussed     HLD  - Patient denies complaints or symptoms of chest pain/shortness of breath with exertion or at rest.   Patient follows a low-fat diet, tries to exercise.     Health Maintenance    Annual retinal eye exam - Patient due  will need to schedule   Annual Dentist visit - Patient due  will need to schedule   Tobacco smoking  -  NO  Alcohol Misuse - NO  Illicit Drug Use- NO  Healthy Diet and physical activity -  YES  Obesity Screen- screened  08/25/2021  Wears seat belt- YES  End of life directives discussed with patient.-Mentions he does not have  a will/or/an advanced directives. Was instructed to start process looking into preparing his will an advanced directives.    The 10-year ASCVD risk score (Arnett DK, et al., 2019) is: 4.3%    Values used to calculate the score:      Age: 56 years      Sex: Male      Is Non-Hispanic African American: Yes      Diabetic: No      Tobacco smoker: No      Systolic Blood Pressure: 116 mmHg      Is BP treated: No      HDL Cholesterol: 64  mg/dL      Total Cholesterol: 186 mg/dL     Review of Systems   Constitutional:  Negative for activity change, appetite change, chills, fatigue, fever and unexpected weight change.   HENT:  Negative for congestion, ear pain, postnasal drip, sinus pressure, sore throat, tinnitus and trouble swallowing.    Eyes:  Negative for pain and redness.   Respiratory:  Negative for cough, chest tightness, shortness of breath and wheezing.    Cardiovascular:  Negative for chest pain, palpitations and leg swelling.   Gastrointestinal:  Negative for abdominal distention, abdominal pain, blood in stool, constipation, diarrhea, nausea and vomiting.   Endocrine: Negative for cold intolerance, heat intolerance and polydipsia.   Genitourinary:  Negative for decreased urine volume, dysuria, flank pain, frequency, hematuria and urgency.   Musculoskeletal:  Negative for arthralgias, back pain, joint swelling, neck pain and neck stiffness.   Skin:  Negative for color change and rash.   Neurological:  Negative for dizziness, weakness, numbness and headaches.   Hematological:  Negative for adenopathy.   Psychiatric/Behavioral:  Negative for behavioral problems, sleep disturbance and suicidal ideas. The patient is not nervous/anxious.         BP 116/64 (Site: Left Upper Arm, Position: Sitting, Cuff  Size: Medium Adult)   Pulse 75   Temp 98.3 F (36.8 C) (Infrared)   Resp 14   Ht 1.753 m (5\' 9" )   Wt 76.7 kg (169 lb)   SpO2 95%   BMI 24.96 kg/m    Physical Exam  Constitutional:       General: He is not in acute distress.     Appearance: Normal appearance. He is not ill-appearing.   HENT:      Head: Normocephalic and atraumatic.      Right Ear: Ear canal normal.      Left Ear: Ear canal normal.      Mouth/Throat:      Mouth: Mucous membranes are moist.      Pharynx: No oropharyngeal exudate or posterior oropharyngeal erythema.   Eyes:      Extraocular Movements: Extraocular movements intact.      Conjunctiva/sclera: Conjunctivae normal.       Pupils: Pupils are equal, round, and reactive to light.   Neck:      Vascular: No carotid bruit.   Cardiovascular:      Rate and Rhythm: Normal rate and regular rhythm.      Pulses: Normal pulses.      Heart sounds: Normal heart sounds. No murmur heard.     No gallop.   Pulmonary:      Effort: Pulmonary effort is normal.      Breath sounds: Normal breath sounds. No wheezing, rhonchi or rales.   Abdominal:      General: Abdomen is flat. There is no distension.   Musculoskeletal:         General: No swelling or tenderness. Normal range of motion.      Cervical back: No tenderness.   Lymphadenopathy:      Cervical: No cervical adenopathy.   Skin:     Findings: No erythema, lesion or rash.   Neurological:      General: No focal deficit present.      Mental Status: He is alert and oriented to person, place, and time. Mental status is at baseline.      Cranial Nerves: No cranial nerve deficit.      Motor: No weakness.   Psychiatric:         Mood and Affect: Mood normal.         Behavior: Behavior normal.         Thought Content: Thought content normal.         Judgment: Judgment normal.         ASSESSMENT/PLAN:  1. 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   Patient will follow-up with [facility] oncologist Dr. Johny Blamer     Patient saw [facility] oncologist Dr. Hyman Bible -  "- We do recommend per our experience adjuvant therapy for 2 years with palbociclib.   - He started Ibrance on 10/11/23 with Dr. Johny Blamer - did have to hold for 5 weeks after C1 at 125mg  daily for prolonged neutropenia. Resumed on 12/05/22 at 75mg  daily d1-21 every 28 days. Agree with plan.  - Restaging scans today (12/08/22) personally reviewed showed no evidence of recurrent disease."    Patient will follow-up with oncologist  per their orders  2. Pure hypercholesterolemia  Assessment & Plan:   Stable.  Treats with lifestyle changes  low-fat diet an exercise.  Will recheck fasting lipid panel.  3. Vitamin D deficiency  Assessment & Plan:    patient takes daily supplements.  Will recheck vitamin D level  Orders:  -     Vitamin D 25 Hydroxy; Future  4. H/O right nephrectomy  Assessment & Plan:   S/p right nephrectomy.  Retroperitoneal mass was wrapped around the right kidney.  We will obtain routine chemistries renal function today  Orders:  -     Renal Function Panel; Future  5. Gynecomastia, male  Assessment & Plan:   Will get Testosterone  levels  Orders:  -     Testosterone; Future    Preventative care discussed.  Encouraged healthy diet choices, reg exercise. Patient agreed w/plan and verbal understanding. Patient advised to call or return with any concerns or problems or worsening conditions. Go to the ER for any severe or potentially life threatening problems.    Return in about 3 months (around 08/03/2023) for Annual physical, 15 minutes .     This note was generated in part or in whole with voice recognition software.  Voice recognition is usually quite accurate but there are transcription errors that can often occur.  All attempts were made to correct these errors I apologized for any  typographical errors that were not detected and corrected.     Electronically signed by Montine Circle. Dan Humphreys, MD on 05/06/2023 at 8:36 AM.

## 2023-05-07 LAB — VITAMIN D 25 HYDROXY: Vit D, 25-Hydroxy: 26.6 ng/mL — ABNORMAL LOW (ref >=30)

## 2023-05-07 LAB — RENAL FUNCTION PANEL
Albumin: 4.5 g/dL (ref 3.4–5.0)
Anion Gap: 11 (ref 3–16)
BUN: 18 mg/dL (ref 7–20)
CO2: 26 mmol/L (ref 21–32)
Calcium: 9.9 mg/dL (ref 8.3–10.6)
Chloride: 104 mmol/L (ref 99–110)
Creatinine: 1.7 mg/dL — ABNORMAL HIGH (ref 0.9–1.3)
Est, Glom Filt Rate: 48 — AB (ref >60)
Glucose: 99 mg/dL (ref 70–99)
Phosphorus: 3.6 mg/dL (ref 2.5–4.9)
Potassium: 4.4 mmol/L (ref 3.5–5.1)
Sodium: 141 mmol/L (ref 136–145)

## 2023-05-08 NOTE — Other (Signed)
Please let patient know that his creatinine has gone up and his kidney function has declined a little.    I like him to increase his water intake to flush his kidneys are to avoid nephrotoxic medicines like anti-inflammatories ibuprofen/naproxen, avoid over-the-counter supplements or vitamins unless recommended by [place] etc.    Although he does not meet criteria for referral to nephrology/kidney doctor given his history of nephrectomy I would like to refer him to nephrologist so he could be closely monitored.    If he is in agreement I will send a referral

## 2023-05-11 ENCOUNTER — Encounter

## 2023-05-11 LAB — TESTOSTERONE: Testosterone: 255 ng/dL (ref 193–740)

## 2023-05-11 NOTE — Other (Signed)
Please let patient know that his testosterone is in the lower percentile for his age.  For diagnostic purposes we will have to obtain a second level around 8:00 in the morning before we could refer to a low testosterone clinic.  Please let patient know that he can go to the lab at his earliest convenience for blood draw.

## 2023-05-20 ENCOUNTER — Encounter

## 2023-05-25 LAB — TESTOSTERONE: Testosterone: 476 ng/dL (ref 193–740)

## 2023-05-25 NOTE — Other (Signed)
Please let patient know testosterone level within normal limits

## 2023-08-29 ENCOUNTER — Encounter

## 2023-08-29 ENCOUNTER — Ambulatory Visit
Admit: 2023-08-29 | Discharge: 2023-08-29 | Payer: PRIVATE HEALTH INSURANCE | Attending: Internal Medicine | Primary: Internal Medicine

## 2023-08-29 DIAGNOSIS — Z0000 Encounter for general adult medical examination without abnormal findings: Secondary | ICD-10-CM

## 2023-08-29 NOTE — Patient Instructions (Signed)
 Well Visit, Ages 51 to 29: Care Instructions  Well visits can help you stay healthy. Your doctor has checked your overall health and may have suggested ways to take good care of yourself. Your doctor also may have recommended tests. You can help prevent illness with healthy eating, good sleep, vaccinations, regular exercise, and other steps.    Get the tests that you and your doctor decide on. Depending on your age and risks, examples might include screening for diabetes; hepatitis C; HIV; and cervical, breast, lung, and colon cancer. Screening helps find diseases before any symptoms appear.   Eat healthy foods. Choose fruits, vegetables, whole grains, lean protein, and low-fat dairy foods. Limit saturated fat and reduce salt.     Limit alcohol. Men should have no more than 2 drinks a day. Women should have no more than 1. For some people, no alcohol is the best choice.   Exercise. Get at least 30 minutes of exercise on most days of the week. Walking can be a good choice.     Reach and stay at your healthy weight. This will lower your risk for many health problems.   Take care of your mental health. Try to stay connected with friends, family, and community, and find ways to manage stress.     If you're feeling depressed or hopeless, talk to someone. A counselor can help. If you don't have a counselor, talk to your doctor.   Talk to your doctor if you think you may have a problem with alcohol or drug use. This includes prescription medicines, marijuana, and other drugs.     Avoid tobacco and nicotine: Don't smoke, vape, or chew. If you need help quitting, talk to your doctor.   Practice safer sex. Getting tested, using condoms or dental dams, and limiting sex partners can help prevent STIs.     Use birth control if it's important to you to prevent pregnancy. Talk with your doctor about your choices and what might be best for you.   Prevent problems where you can. Protect your skin from too much sun, wash your  hands, brush your teeth twice a day, and wear a seat belt in the car.   Where can you learn more?  Go to [email or web address] and enter P072 to learn more about Well Visit, Ages 51 to 75: Care Instructions.  Current as of: July 04, 2022  Content Version: 14.1   2006-2024 [employer], [employer].   Care instructions adapted under license by [facility]. If you have questions about a medical condition or this instruction, always ask your healthcare professional. Health[employer], Incorporated disclaims any warranty or liability for your use of this information.           Prostate Cancer Screening: Care Instructions  Overview     Prostate cancer is the abnormal growth of cells in the prostate. The prostate is part of the male reproductive system. It is a small organ below the bladder that makes fluid for semen.  Screening can help find prostate cancer early. When it's found and treated early, the cancer may be cured. But it's not always treated. That's because the treatments can cause serious side effects. In most cases, prostate cancer isn't life-threatening. This is especially true in someone who is older and when the cancer grows slowly.  Prostate cancer is a common type of cancer. Most cases occur after age 79. The disease runs in families. And it's more common in African Americans.  Follow-up care is a key part  of your treatment and safety. Be sure to make and go to all appointments, and call your doctor if you are having problems. It's also a good idea to know your test results and keep a list of the medicines you take.  What is the screening test for prostate cancer?  The main screening test for prostate cancer is the prostate-specific antigen (PSA) test. This is a blood test that measures how much PSA is in your blood. A high level may mean that you have an enlarged prostate, an infection, or cancer.  Along with the PSA test, you may have a digital (finger) rectal exam. This exam checks for  anything abnormal in your prostate. To do the exam, the doctor puts a lubricated, gloved finger into your rectum.  If these tests suggest cancer, you may need a prostate biopsy to see if there are cancer cells in the prostate.  What are the pros and cons of screening?  Neither a PSA test nor a digital rectal exam can tell you for sure that you do or do not have cancer. But they can help you decide if you need more tests, such as a prostate biopsy. Screening tests may be useful because prostate cancer often doesn't cause symptoms. It can be hard to know if you have cancer until it's more advanced. And then it's harder to treat.  But having a PSA test can also cause harm. The test may show high levels of PSA that aren't caused by cancer. So you could have a prostate biopsy you didn't need. Or the PSA test might be normal when there is cancer, so a cancer might not be found early. The test can also find cancers that would never have caused a problem during your lifetime. So you might have treatment that wasn't needed.  Prostate cancer usually develops late in life and grows slowly. In most cases, it doesn't shorten lives.  Talk with your doctor to see if screening is right for you.  Where can you learn more?  Go to [email or web address] and enter R550 to learn more about Prostate Cancer Screening: Care Instructions.  Current as of: September 22, 2022  Content Version: 14.1   2006-2024 [employer], Inc[employer].   Care instructions adapted under license by [facility]. If you have questions about a medical condition or this instruction, always ask your healthcare professional. Health[employer], Incorporated disclaims any warranty or liability for your use of this information.           Learning About Living [place]  What is a living will?     A living will, also called a declaration, is a legal form. It tells your family and your doctor your wishes when you can't speak for yourself. It's used by the health  professionals who will treat you as you near the end of your life or if you get seriously hurt or ill.  If you put your wishes in writing, your loved ones and others will know what kind of care you want. They won't need to guess. This can ease your mind and be helpful to others. And you can change or cancel your living will at any time.  A living will is not the same as an estate or property will. An estate will explains what you want to happen with your money and property after you die.  How do you use it?  Keep these facts in mind about how a living will is used.  Your living will is used only  if you can't speak or make decisions for yourself. Most often, one or more doctors must certify that you can't speak or decide for yourself before your living will takes effect.  If you get better and can speak for yourself again, you can accept or refuse any treatment. It doesn't matter what you said in your living will.  Some states may limit your right to refuse treatment in certain cases. For example, you may need to clearly state in your living will that you don't want artificial hydration and nutrition, such as being fed through a tube.  Is a living will a legal document?  A living will is a legal document. Each state has its own laws about living wills. And a living will may be called something else in your state.  Here are some things to know about living wills.  You don't need an attorney to complete a living will. But legal advice can be helpful if your state's laws are unclear. It can also help if your health history is complicated or your family can't agree on what should be in your living will.  You can change your living will at any time. Some people find that their wishes about end-of-life care change as their health changes. If you make big changes to your living will, complete a new form.  If you move to another state, make sure that your living will is legal in the state where you now live. In most cases,  doctors will respect your wishes even if you have a form from a different state.  You might use a universal form that has been approved by many states. This kind of form can sometimes be filled out and stored online. Your digital copy will then be available wherever you have a connection to the internet. The doctors and nurses who need to treat you can find it right away.  Your state may offer an online registry. This is another place where you can store your living will online.  It's a good idea to get your living will notarized. This means using a person called a notary public to watch two people sign, or witness, your living will.  What should you know when you create a living will?  Here are some questions to ask yourself as you make your living will.  Do you know enough about life support methods that might be used? If not, talk to your doctor so you know what might be done if you can't breathe on your own, your heart stops, or you can't swallow.  What things would you still want to be able to do after you receive life-support methods? Would you want to be able to walk? To speak? To eat on your own? To live without the help of machines?  Do you want certain religious practices performed if you become very ill?  If you have a choice, where do you want to be cared for? In your home? At a hospital or nursing home?  If you have a choice at the end of your life, where would you prefer to die? At home? In a hospital or nursing home? Somewhere else?  Would you prefer to be buried or cremated?  Do you want your organs to be donated after you die?  What should you do with your living will?  Make sure that your family members and your health care agent have copies of your living will (also called a declaration).  Give your doctor a  copy of your living will. Ask to have it kept as part of your medical record. If you have more than one doctor, make sure that each one has a copy.  Put a copy of your living will where it can  be easily found. For example, some people may put a copy on their refrigerator door. If you are using a digital copy, be sure your doctor, family members, and health care agent know how to find and access it.  Where can you learn more?  Go to [email or web address] and enter K356 to learn more about Learning About Living [place].  Current as of: October 14, 2022  Content Version: 14.1   2006-2024 Healthwise, Inc[employer].   Care instructions adapted under license by [facility]. If you have questions about a medical condition or this instruction, always ask your healthcare professional. Healthwise, Incorporated disclaims any warranty or liability for your use of this information.           Learning About Medical Power of Attorney  What is a medical power of attorney?     A medical power of attorney, also called a durable power of attorney for health care, is one type of the legal forms called advance directives. It lets you name the person you want to make treatment decisions for you if you can't speak or decide for yourself. The person you choose is called your health care agent. This person is also called a health care proxy or health care surrogate.  A medical power of attorney may be called something else in your state.  How do you choose a health care agent?  Choose your health care agent carefully. This person may or may not be a family member.  Talk to the person before you make your final decision. Make sure this person is comfortable with this responsibility.  It's a good idea to choose someone who:  Is at least 51 years old.  Knows you well and understands what makes life meaningful for you.  Understands your religious and moral values.  Will do what you want, not what that person wants.  Will be able to make difficult choices at a stressful time.  Will be able to refuse or stop treatment, if that is what you would want, even if you could die.  Will be firm and confident with health  professionals if needed.  Will ask questions to get needed information.  Lives near you or agrees to travel to you if needed.  Your family may help you make medical decisions while you can still be part of that process. But it's important to choose one person to be your health care agent in case you aren't able to make decisions for yourself.  If you don't fill out the legal form and name a health care agent, the decisions your family can make may be limited.  A health care agent may be called something else in your state.  Who will make decisions for you if you don't have a health care agent?  If you don't have a health care agent or a living will, you may not get the care you want. Decisions may be made by family members who disagree about your medical care. Or decisions may be made by a medical professional who doesn't know you well. In some cases, a judge makes the decisions.  When you name a health care agent, it is very clear who has the power to make health decisions for you.  How  do you name a health care agent?  You name your health care agent on a legal form. This form is usually called a medical power of attorney. Ask your hospital, state bar association, or office on aging where to find these forms.  You must sign the form to make it legal. Some states require you to get the form notarized. This means that a person called a notary public watches you sign the form and then the notary signs the form. Some states also require that two or more witnesses sign the form.  Be sure to tell your family members and doctors who your health care agent is.  Where can you learn more?  Go to [email or web address] and enter P737 to learn more about Learning About Medical Power of [place].  Current as of: September 21, 2022  Content Version: 14.1   2006-2024 Healthwise, Incorporated.   Care instructions adapted under license by [facility]. If you have questions about a medical condition or this  instruction, always ask your healthcare professional. Healthwise, Incorporated disclaims any warranty or liability for your use of this information.           Advance Care Planning: Care Instructions  Overview     It can be hard to live with an illness that cannot be cured. But if your health is getting worse, you may want to make decisions about end-of-life care. Planning for the end of your life does not mean that you are giving up. It is a way to make sure that your wishes are met. Clearly stating your wishes can make it easier for your loved ones. Making plans while you are still able may also ease your mind and make your final days less stressful and more meaningful.  Follow-up care is a key part of your treatment and safety. Be sure to make and go to all appointments, and call your doctor if you are having problems. It's also a good idea to know your test results and keep a list of the medicines you take.  What can you do to plan for the end of life?  You can bring these issues up with your doctor. You do not need to wait until your doctor starts the conversation. You might start with, What makes life worth living for me is. . . And then follow it with, I would not be willing to live with . . . When you complete this sentence it helps your doctor understand your wishes.  Talk openly and honestly with your doctor. This is the best way to understand the decisions you will need to make as your health changes. Know that you can always change your mind.  Ask your doctor about commonly used life-support measures. These include tube feedings, breathing machines, and fluids given through a vein (I.V.). Understanding these treatments will help you decide whether you want them.  You may choose to have these life-supporting treatments for a limited time. This allows a trial period to see whether they will help you. You may also decide that you want your doctor to take only certain measures to keep you alive. It may  help to think about the big picture, like what makes life worth living for you or what your values and goals are.  Talk to your doctor about how long you are likely to live. Your doctor may be able to give you an idea of what usually happens with your specific illness.  Think about preparing papers that state your  wishes. These papers are called advance directives. If you do this early and review them often, there will not be any confusion about what you want. You can change your instructions at any time.  Which papers should you prepare?  Advance directives are legal papers that tell doctors how you want to be cared for at the end of your life. You do not need a lawyer to write these papers. Ask your doctor or your state health department for information on how to write your advance directives. They may have the forms for each of these types of papers. Make sure your doctor has a copy of these on file, and give a copy to a family member or close friend.  Consider a do-not-resuscitate order (DNR). This order asks that no extra treatments be done if your heart stops or you stop breathing. Extra treatments may include cardiopulmonary resuscitation (CPR), electrical shock to restart your heart, or a machine to breathe for you. If you decide to have a DNR order, ask your doctor to explain and write it. Place the order in your home where everyone can easily see it.  Consider a living will. A living will explains your wishes about life support and other treatments at the end of your life if you become unable to speak for yourself. Living wills tell doctors to use or not use treatments that would keep you alive. You must have one or two witnesses or a notary present when you sign this form. A living will may be called something else in your state.  Consider a medical power of attorney. This form allows you to name a person to make decisions about your care if you are not able to. Most people ask a close friend or family  member. Talk to this person about the kinds of treatments you want and those that you do not want. Make sure this person understands your wishes. A medical power of attorney may be called something else in your state.  These legal papers are simple to change. Tell your doctor what you want to change, and ask him or her to make a note in your medical file. Give your family updated copies of the papers.  Where can you learn more?  Go to [email or web address] and enter P184 to learn more about Advance Care Planning: Care Instructions.  Current as of: September 21, 2022  Content Version: 14.1   2006-2024 Health[employer], Inc[employer].   Care instructions adapted under license by [facility]. If you have questions about a medical condition or this instruction, always ask your healthcare professional. Healthwise, Incorporated disclaims any warranty or liability for your use of this information.      Alcohol Abuse and Alcoholism   (Alcohol Dependence; Alcohol Use Disorder)       Definition   Alcohol abuse is excessive or problematic alcohol consumption. It can progress to alcoholism.   Alcoholism is chronic alcohol abuse that results in a physical dependence on alcohol (withdrawal symptoms) and an inability to stop or limit drinking.   Causes   Several factors can contribute to alcohol abuse and alcoholism, including:   Genes   Brain chemicals that may be different than normal   Social pressure   Emotional stress   Pain   Depression and other mental health problems   Problem drinking behaviors learned from family or friends   Risk Factors   These factors increase your chance of developing alcoholism. Tell your doctor if you have any of these risk  factors:   Sex: male   Family members who abuse alcohol (especially men whose fathers or brothers are alcoholic)   Starting to use alcohol at an early age (younger than 71)   Using illicit drugs or non-medical use of prescription drugs   Peer pressure   Easy access to  alcoholic beverages   Psychiatric disorders, such as depression or anxiety   Smoking   Symptoms   It is common to deny an alcohol problem. Alcohol abuse can occur without physical dependence.   Alcohol abuse symptoms include:   Repeated work, school, or home problems due to drinking   Risking physical safety   Recurring trouble with the law, often including drinking and driving   Continuing to drink despite alcohol-related difficulties   Symptoms of alcoholism include:   Craving a drink   Unable to stop or limit drinking   Needing greater amounts of alcohol to feel the same effect   Giving up activities in order to drink or recover from alcohol   Drinking that continues even when it causes or worsens health problems   Wanting to stop or reduce drinking, but not being able   Withdrawal symptoms if alcohol is stopped include:   Nausea   Sweating   Shaking   Anxiety   Increased blood pressure   Seizures ( delirium tremens [DTs])   The brain, nervous system, heart, liver, stomach, gastrointestinal tract, and pancreas can all be damaged by alcoholism.     Some of the Organs Damaged in Alcohol Abuse        2011 [employer]   Diagnosis   Doctors ask a series of questions to assess possible alcohol-related problems, including:   Have you tried to reduce your drinking?   Have you felt bad about drinking?   Have you been annoyed by another person's criticism of your drinking?   Do you drink in the morning to steady your nerves or cure a hangover?   Do you have problems with a job, your family, or the law?   Do you drive under the influence of alcohol?   Blood tests may be done to:   Look at the size of your red blood cells and to check for a substance called carbohydrate-deficient transferrin   Check for alcohol-related liver disease and other health problems   Treatment   Treatment for alcohol abuse or dependence is aimed at teaching patients how to manage the disease. Most professionals believe that this  means giving up alcohol completely and permanently.   The first and most important step is recognizing a problem exists. Successful treatment depends on your desire to change. Your doctor can help you withdraw from alcohol safely. This could require hospitalization in a detoxification center. They will carefully monitor you for side effects. You may need medication while you are undergoing detoxification.   Treatments include:   Medications    Drugs can help relieve some of the symptoms of withdrawal and help prevent relapse. The doctor may prescribe medication to reduce cravings for alcohol.   Medications used to treat alcoholism and to try to prevent drinking include:   Naltrexone (ReVia, Vivitrol)blocks the high that makes you crave alcohol   Disulfiram (Antabuse)makes you very sick if you drink alcohol   Acamprosate (Campral)reduces your craving for alcohol   A study showed that an anticonvulsant drug, topiramate (Topamax), may reduce alcohol dependence.   Education and Counseling    Therapy helps you to recognize alcohol's dangers. Education  raises awareness of underlying issues and lifestyles that promote drinking. In therapy, you work to improve coping skills and learn other ways of dealing with stress or pain.   Mentoring and Community Help    Alcoholics Anonymous (AA) helps many people to stop drinking and stay sober. Members meet regularly and support each other. Your family members may also benefit from attending meetings of Al-Anon. Living with an alcoholic can be a painful, stressful situation.   Here are some general statistics on treatment outcomes of individuals one year after attempting to stop drinking:   1/3 remained abstinent   1/3 resumed drinking but at a lower level   1/3 relapsed completely   If you are diagnosed with alcohol abuse or alcoholism, follow your doctor's instructions .   Prevention   Realizing that alcohol causes problems helps some people avoid it. Suggestions to decrease the  risk of alcohol abuse and dependence include:   Socialize without alcohol.   Avoid going to bars.   Do not keep alcohol in your home.   Avoid situations and people that encourage drinking.   Make new nondrinking friends.   Do fun things that do not involve alcohol.   Avoid reaching for a drink when stressed or upset.   Limit your alcohol intake to a moderate level.   Moderate is two or fewer drinks per day for men and one or fewer for women and older adults   A 12-ounce bottle of beer, a five-ounce glass of wine, or 1.5 ounces of liquor is considered one drink   If you are a parent, having a good relationship with your children may reduce their risk of alcohol abuse.   Most professionals who treat alcohol abuse and dependence believe that complete abstinence is the only effective prevention.     Last Reviewed: September 2010 Bernardino Kern, MD, PhD, MPH   Updated: 08/18/2009          Learning About Changing a Habit by Setting Goals  How can you change a habit?     If you've decided to change a habit--whether it's quitting smoking, lowering your blood pressure, becoming more active, or doing something else to improve your health--congratulations! Making that decision is the first step toward making a change.  What happens next? Have a reason. Set goals you can reach. Prepare for slip-ups. And get support.  What's your reason?  Your reason for wanting to change a habit is really important. Maybe you want to quit smoking so that you can avoid future health problems. Or maybe you want to eat a healthier diet so you can lose weight. If you have high blood pressure, your reason may be clear: to lower your blood pressure. Maybe you smoke and want to save money on cigarettes.  You need to feel ready to make a change. If you don't feel ready now, that's okay. You can still be thinking and planning. When you truly want to make changes, you're ready for the next step.  It's not easy to change habits--but you can do it. Taking the  time to really think about what will motivate or inspire you will help you reach your goals.  How do you set goals?  Setting goals can help a lot when you're trying to make a healthy change.  Focus on small goals. This will help you reach larger goals over time. With smaller goals, you'll have success more often, which will help you stay with it. For example, your large goal  may be to lose 20 pounds. Your small goal could be to lose 5.  Write down your goals. This will help you remember, and you'll have a clearer idea of what you want to achieve. Use a journal or notebook to record your goals. Hang up your plan where you will see it often as a reminder of what you're trying to do.  Make your goals specific. Specific goals help you measure your progress. For example, setting a goal to eat one extra serving of vegetables a day is better than a general goal to eat more vegetables.  Focus on one goal at a time. By doing this, you're less likely to feel overwhelmed and then give up.  When you reach a goal, reward yourself.  Celebrate your new behavior and success for several days, and then think about setting your next goal.  How can you prepare for slip-ups?  It's perfectly normal to try to change a habit, go along fine for a while, and then have a setback. Lots of people try and try again before they reach their goals.  What are the things that might cause a setback for you? If you have tried to change a habit before, think about what helped you and what got in your way.  By thinking about these barriers now, you can plan ahead for how to deal with them if they happen.  There will be times when you slip up and don't make your goal for the week. When that happens, don't get mad at yourself. Learn from the experience. Ask yourself what got in the way of reaching your goal. Positive thinking goes a long way when you're making lifestyle changes.  How can you get support?  Get a partner. It's motivating to know that  someone is trying to make the same change that you're making, like being more active or changing your eating habits. You have someone who is counting on you to help them succeed. That person can also remind you how far you've come.  Get friends and family involved. They can exercise with you. Or they can encourage you by saying how they admire what you are doing. Family members can join you in your healthy eating efforts. Don't be afraid to tell family and friends that their encouragement makes a big difference to you.  Join a class or support group. People in these groups often have some of the same barriers you have. They can give you support when you don't feel like staying with your plan. They can boost your morale when you need a lift. You'll also find a number of online support groups.  Encourage yourself. When you feel like giving up, don't waste energy feeling bad about yourself. Remember your reason for wanting to change, think about the progress you've made, and give yourself a pep talk and a pat on the back.  Get professional help. A dietitian can help you make your diet healthier while still allowing you to eat foods that you enjoy. A trainer or physical therapist can help design an exercise program that is fun and easy to stay on. A counselor, a child psychotherapist, or your doctor can help you overcome hurdles, reduce stress, or quit smoking.  Where can you learn more?  Go to [employer] and enter 825-222-3718 to learn more about Learning About Changing a Habit by Setting Goals.  Current as of: May 22, 2022  Content Version: 14.1   2006-2024 [employer].   Care instructions adapted  under license by [facility]. If you have questions about a medical condition or this instruction, always ask your healthcare professional. [employer] disclaims any warranty or liability for your use of this information.      WELL ADULT LIFESTYLE INSTRUCTIONS:    Pick a day in the  next week to spend an hour reviewing the information below then:     1) determine your health goals for the year   2) determine what changes you need to achieve those goals   3) design your daily routine, shopping habits etc to implement those changes        Default Right Action (no choices)       Make it EASY to do the RIGHT THINGS.   4) I invite you to send me your plans via mychart so I can continue to help you       with them    Examine your lifestyle with an emphasis on BARRIERS to bad and good habits and how you can design your life to make better choices.    If you want to feel better these are the FUNDAMENTAL PILLARS of Wellness:    1)  You can choose to Get 150 min/week of moderate exercise (can talk but can't        sing) or 75 min/week of vigorous exercise (can't talk).   This will enhance your sense of well being (Exercise is as good as medicine for   depression.)    2)  You can choose to Get 7-9 hours of sleep per night    Detoxifies your brain, reduces risk of dementia    3)  You can choose to Strength Train 2 x a week on non-consecutive days   This will improve function and reduce risk of injury.   Body weight type exercises   such as Yoga and Pilates are excellent choices.    4)  You can choose Good Nutrition.  Only eat your goal weight (in lbs) x 10        calories/day and get 5 servings of Vegetables/day   Plant based diets reduce risk of heart attack/stroke and will help you feel full on   less food.   Avoid highly processed foods and processed carbohydrates.    5)  You can choose Moderate alcohol intake < 1-2 drinks/day   Alcohol will disrupt your sleep and add calories to your day.    6)  You can choose to Develop a Charismatic/Supportive relationship.     This will strengthen your resilience for the ups and downs.    7)  You can choose to Practice Mindfulness.     An hour a day of prayer/meditation/gratitude will change your life!    If you are trying to lose weight, here are some  recommendations for weight loss:  Not every weight loss program is appropriate for everybody...  good online sources include Noom (more social with daily check ins), Lifesum (similar but less social) and Naturally slim, as well as Brandneu ($1500)    The GI Diet or Primal diet, Intermittent fasting can also be effective choices.    If you have diabetes treated with insulin be sure to ask for specific guidance around meals.    Take your desired weight in pounds and multiply by 10 and that is your average daily calorie allowance.  For example if you wish to weigh 170 lb x 10 = 1700 cal/day (this is how to gradually lose the weight and  maintain your desired weight).    Avoid soda/coke and all wet carbs => Drink ice water instead    Drink a large glass of ice water before meals and EAT SLOWLY (talk while you eat)!    Rethink your hunger => it means your losing weight.    Minimize highly processed carbohydrates as they stimulate your appetite:  Specifically cut back on Bread, Rice, Pasta and Potatoes    Avoid eating calories after 6 pm        DASH Diet: After Your Visit  Your Care Instructions  The DASH diet is an eating plan that can help lower your blood pressure. DASH stands for Dietary Approaches to Stop Hypertension. Hypertension is high blood pressure.  The DASH diet focuses on eating foods that are high in calcium, potassium, and magnesium. These nutrients can lower blood pressure. The foods that are highest in these nutrients are fruits, vegetables, low-fat dairy products, nuts, seeds, and legumes. But taking calcium, potassium, and magnesium supplements instead of eating foods that are high in those nutrients does not have the same effect. The DASH diet also includes whole grains, fish, and poultry.  The DASH diet is one of several lifestyle changes your doctor may recommend to lower your high blood pressure. Your doctor may also want you to decrease the amount of sodium in your diet. Lowering sodium while  following the DASH diet can lower blood pressure even further than just the DASH diet alone.  Follow-up care is a key part of your treatment and safety. Be sure to make and go to all appointments, and call your doctor if you are having problems. It's also a good idea to know your test results and keep a list of the medicines you take.  How can you care for yourself at home?  Following the DASH diet  Eat 4 to 5 servings of fruit each day. A serving is 1 medium-sized piece of fruit,  cup chopped or canned fruit, 1/4 cup dried fruit, or 4 ounces ( cup) of fruit juice. Choose fruit more often than fruit juice.  Eat 4 to 5 servings of vegetables each day. A serving is 1 cup of lettuce or raw leafy vegetables,  cup of chopped or cooked vegetables, or 4 ounces ( cup) of vegetable juice. Choose vegetables more often than vegetable juice.  Get 2 to 3 servings of low-fat and fat-free dairy each day. A serving is 8 ounces of milk, 1 cup of yogurt, or 1  ounces of cheese.  Eat 7 to 8 servings of grains each day. A serving is 1 slice of bread, 1 ounce of dry cereal, or  cup of cooked rice, pasta, or cooked cereal. Try to choose whole-grain products as much as possible.  Limit lean meat, poultry, and fish to 6 ounces each day. Six ounces is about the size of two decks of cards.  Eat 4 to 5 servings of nuts, seeds, and legumes (cooked dried beans, lentils, and split peas) each week. A serving is 1/3 cup of nuts, 2 tablespoons of seeds, or  cup cooked dried beans or peas.  Limit sweets and added sugars to 5 servings or less a week. A serving is 1 tablespoon jelly or jam,  cup sorbet, or 1 cup of lemonade.  Tips for success  Start small. Do not try to make dramatic changes to your diet all at once. You might feel that you are missing out on your favorite foods and then be more likely  to not follow the plan. Make small changes, and stick with them. Once those changes become habit, add a few more changes.  Try some of the  following:  Make it a goal to eat a fruit or vegetable at every meal and at snacks. This will make it easy to get the recommended amount of fruits and vegetables each day.  Try yogurt topped with fruit and nuts for a snack or healthy dessert.  Add lettuce, tomato, cucumber, and onion to sandwiches.  Combine a ready-made pizza crust with low-fat mozzarella cheese and lots of vegetable toppings. Try using tomatoes, squash, spinach, broccoli, carrots, cauliflower, and onions.  Have a variety of cut-up vegetables with a low-fat dip as an appetizer instead of chips and dip.  Sprinkle sunflower seeds or chopped almonds over salads. Or try adding chopped walnuts or almonds to cooked vegetables.  Try some vegetarian meals using beans and peas. Add garbanzo or kidney beans to salads. Make burritos and tacos with mashed pinto beans or black beans     2006-2012 [employer], [employer]. Care instructions adapted under license by [facility]. This care instruction is for use with your licensed healthcare professional. If you have questions about a medical condition or this instruction, always ask your healthcare professional. [employer], [employer] disclaims any warranty or liability for your use of this information.  Content Version: 9.4.94723; Last Revised: February 11, 2011                Heart-Healthy Diet   Sodium, Fat, and Cholesterol Controlled Diet       What Is a Heart Healthy Diet?   A heart-healthy diet is one that limits sodium , certain types of fat , and cholesterol . This type of diet is recommended for:   People with any form of cardiovascular disease (eg, coronary heart disease , peripheral vascular disease , previous heart attack , previous stroke )   People with risk factors for cardiovascular disease, such as high blood pressure , high cholesterol , or diabetes   Anyone who wants to lower their risk of developing cardiovascular disease   Sodium    Sodium is a mineral found in many foods. In  general, most people consume much more sodium than they need. Diets high in sodium can increase blood pressure and lead to edema (water retention). On a heart-healthy diet, you should consume no more than 2,300 mg (milligrams) of sodium per dayabout the amount in one teaspoon of table salt. The foods highest in sodium include table salt (about 50% sodium), processed foods, convenience foods, and preserved foods.   Cholesterol    Cholesterol is a fat-like, waxy substance in your blood. Our bodies make some cholesterol. It is also found in animal products, with the highest amounts in fatty meat, egg yolks, whole milk, cheese, shellfish, and organ meats. On a heart-healthy diet, you should limit your cholesterol intake to less than 200 mg per day.   It is normal and important to have some cholesterol in your bloodstream. But too much cholesterol can cause plaque to build up within your arteries, which can eventually lead to a heart attack or stroke.   The two types of cholesterol that are most commonly referred to are:   Low-density lipoprotein (LDL) cholesterol  Also known as bad cholesterol, this is the cholesterol that tends to build up along your arteries. Bad cholesterol levels are increased by eating fats that are saturated or hydrogenated. Optimal level of this cholesterol is less than  100. Over 130 starts to get risky for heart disease.   High-density lipoprotein (HDL) cholesterol  Also known as good cholesterol, this type of cholesterol actually carries cholesterol away from your arteries and may, therefore, help lower your risk of having a heart attack. You want this level to be high (ideally greater than 60). It is a risk to have a level less than 40. You can raise this good cholesterol by eating olive oil, canola oil, avocados, or nuts. Exercise raises this level, too.   Fat    Fat is calorie dense and packs a lot of calories into a small amount of food. Even though fats should be limited due to their high  calorie content, not all fats are bad. In fact, some fats are quite healthful. Fat can be broken down into four main types.   The good-for-you fats are:   Monounsaturated fat  found in oils such as olive and canola, avocados, and nuts and natural nut butters; can decrease cholesterol levels, while keeping levels of HDL cholesterol high   Polyunsaturated fat  found in oils such as safflower, sunflower, soybean, corn, and sesame; can decrease total cholesterol and LDL cholesterol   Omega-3 fatty acids  particularly those found in fatty fish (such as salmon, trout, tuna, mackerel, herring, and sardines); can decrease risk of arrhythmias, decrease triglyceride levels, and slightly lower blood pressure   The fats that you want to limit are:   Saturated fat  found in animal products, many fast foods, and a few vegetables; increases total blood cholesterol, including LDL levels   Animal fats that are saturated include: butter, lard, whole-milk dairy products, meat fat, and poultry skin   Vegetable fats that are saturated include: hydrogenated shortening, palm oil, coconut oil, cocoa butter   Hydrogenated or trans fat  found in margarine and vegetable shortening, most shelf stable snack foods, and fried foods; increases LDL and decreases HDL     It is generally recommended that you limit your total fat for the day to less than 30% of your total calories. If you follow an 1800-calorie heart healthy diet, for example, this would mean 60 grams of fat or less per day.   Saturated fat and trans fat in your diet raises your blood cholesterol the most, much more than dietary cholesterol does. For this reason, on a heart-healthy diet, less than 7% of your calories should come from saturated fat and ideally 0% from trans fat. On an 1800-calorie diet, this translates into less than 14 grams of saturated fat per day, leaving 46 grams of fat to come from mono- and polyunsaturated fats.   Food Choices on a Heart Healthy Diet   Food  Category   Foods Recommended   Foods to Avoid   Grains   Breads and rolls without salted tops Most dry and cooked cereals Unsalted crackers and breadsticks Low-sodium or homemade breadcrumbs or stuffing All rice and pastas   Breads, rolls, and crackers with salted tops High-fat baked goods (eg, muffins, donuts, pastries) Quick breads, self-rising flour, and biscuit mixes Regular bread crumbs Instant hot cereals Commercially prepared rice, pasta, or stuffing mixes   Vegetables   Most fresh, frozen, and low-sodium canned vegetables Low-sodium and salt-free vegetable juices Canned vegetables if unsalted or rinsed   Regular canned vegetables and juices, including sauerkraut and pickled vegetables Frozen vegetables with sauces Commercially prepared potato and vegetable mixes   Fruits   Most fresh, frozen, and canned fruits All fruit juices  Fruits processed with salt or sodium   Milk   Nonfat or low-fat (1%) milk Nonfat or low-fat yogurt Cottage cheese, low-fat ricotta, cheeses labeled as low-fat and low-sodium   Whole milk Reduced-fat (2%) milk Malted and chocolate milk Full fat yogurt Most cheeses (unless low-fat and low salt) Buttermilk (no more than 1 cup per week)   Meats and Beans   Lean cuts of fresh or frozen beef, veal, lamb, or pork (look for the word loin) Fresh or frozen poultry without the skin Fresh or frozen fish and some shellfish Egg whites and egg substitutes (Limit whole eggs to three per week) Tofu Nuts or seeds (unsalted, dry-roasted), low-sodium peanut butter Dried peas, beans, and lentils   Any smoked, cured, salted, or canned meat, fish, or poultry (including bacon, chipped beef, cold cuts, hot dogs, sausages, sardines, and anchovies) Poultry skins Breaded and/or fried fish or meats Canned peas, beans, and lentils Salted nuts   Fats and Oils   Olive oil and canola oil Low-sodium, low-fat salad dressings and mayonnaise   Butter, margarine, coconut and palm oils, bacon fat   Snacks, Sweets, and  Condiments   Low-sodium or unsalted versions of broths, soups, soy sauce, and condiments Pepper, herbs, and spices; vinegar, lemon, or lime juice Low-fat frozen desserts (yogurt, sherbet, fruit bars) Sugar, cocoa powder, honey, syrup, jam, and preserves Low-fat, trans-fat free cookies, cakes, and pies Graham and animal crackers, fig bars, ginger snaps   High-fat desserts Broth, soups, gravies, and sauces, made from instant mixes or other high-sodium ingredients Salted snack foods Canned olives Meat tenderizers, seasoning salt, and most flavored vinegars   Beverages   Low-sodium carbonated beverages Tea and coffee in moderation Soy milk   Commercially softened water   Suggestions   Make whole grains, fruits, and vegetables the base of your diet.    Choose heart-healthy fats such as canola, olive, and flaxseed oil, and foods high in heart-healthy fats, such as nuts, seeds, soybeans, tofu, and fish.    Eat fish at least twice per week; the fish highest in omega-3 fatty acids and lowest in mercury include salmon, herring, mackerel, sardines, and canned chunk light tuna. If you eat fish less than twice per week or have high triglycerides, talk to your doctor about taking fish oil supplements.    Read food labels.   For products low in fat and cholesterol, look for fat free, low-fat, cholesterol free, saturated fat free, and trans fat freeAlso scan the Nutrition Facts Label, which lists saturated fat, trans fat, and cholesterol amounts.   For products low in sodium, look for sodium free, very low sodium, low sodium, no added salt, and unsalted   Skip the salt when cooking or at the table; if food needs more flavor, get creative and try out different herbs and spices. Garlic and onion also add substantial flavor to foods.    Trim any visible fat off meat and poultry before cooking, and drain the fat off after browning.    Use cooking methods that require little or no added fat, such as grilling, boiling, baking, poaching,  broiling, roasting, steaming, stir-frying, and sauting.    Avoid fast food and convenience food. They tend to be high in saturated and trans fat and have a lot of added salt.    Talk to a registered dietitian for individualized diet advice.      Last Reviewed: March 2011 Hadassah Clause, MS, MPH, RD   Updated: 02/24/2010     Heart-Healthy Diet  Sodium, Fat, and Cholesterol Controlled Diet       What Is a Heart Healthy Diet?   A heart-healthy diet is one that limits sodium , certain types of fat , and cholesterol . This type of diet is recommended for:   People with any form of cardiovascular disease (eg, coronary heart disease , peripheral vascular disease , previous heart attack , previous stroke )   People with risk factors for cardiovascular disease, such as high blood pressure , high cholesterol , or diabetes   Anyone who wants to lower their risk of developing cardiovascular disease   Sodium    Sodium is a mineral found in many foods. In general, most people consume much more sodium than they need. Diets high in sodium can increase blood pressure and lead to edema (water retention). On a heart-healthy diet, you should consume no more than 2,300 mg (milligrams) of sodium per dayabout the amount in one teaspoon of table salt. The foods highest in sodium include table salt (about 50% sodium), processed foods, convenience foods, and preserved foods.   Cholesterol    Cholesterol is a fat-like, waxy substance in your blood. Our bodies make some cholesterol. It is also found in animal products, with the highest amounts in fatty meat, egg yolks, whole milk, cheese, shellfish, and organ meats. On a heart-healthy diet, you should limit your cholesterol intake to less than 200 mg per day.   It is normal and important to have some cholesterol in your bloodstream. But too much cholesterol can cause plaque to build up within your arteries, which can eventually lead to a heart attack or stroke.   The two types of cholesterol that  are most commonly referred to are:   Low-density lipoprotein (LDL) cholesterol  Also known as bad cholesterol, this is the cholesterol that tends to build up along your arteries. Bad cholesterol levels are increased by eating fats that are saturated or hydrogenated. Optimal level of this cholesterol is less than 100. Over 130 starts to get risky for heart disease.   High-density lipoprotein (HDL) cholesterol  Also known as good cholesterol, this type of cholesterol actually carries cholesterol away from your arteries and may, therefore, help lower your risk of having a heart attack. You want this level to be high (ideally greater than 60). It is a risk to have a level less than 40. You can raise this good cholesterol by eating olive oil, canola oil, avocados, or nuts. Exercise raises this level, too.   Fat    Fat is calorie dense and packs a lot of calories into a small amount of food. Even though fats should be limited due to their high calorie content, not all fats are bad. In fact, some fats are quite healthful. Fat can be broken down into four main types.   The good-for-you fats are:   Monounsaturated fat  found in oils such as olive and canola, avocados, and nuts and natural nut butters; can decrease cholesterol levels, while keeping levels of HDL cholesterol high   Polyunsaturated fat  found in oils such as safflower, sunflower, soybean, corn, and sesame; can decrease total cholesterol and LDL cholesterol   Omega-3 fatty acids  particularly those found in fatty fish (such as salmon, trout, tuna, mackerel, herring, and sardines); can decrease risk of arrhythmias, decrease triglyceride levels, and slightly lower blood pressure   The fats that you want to limit are:   Saturated fat  found in animal products, many fast foods, and  a few vegetables; increases total blood cholesterol, including LDL levels   Animal fats that are saturated include: butter, lard, whole-milk dairy products, meat fat, and poultry skin    Vegetable fats that are saturated include: hydrogenated shortening, palm oil, coconut oil, cocoa butter   Hydrogenated or trans fat  found in margarine and vegetable shortening, most shelf stable snack foods, and fried foods; increases LDL and decreases HDL     It is generally recommended that you limit your total fat for the day to less than 30% of your total calories. If you follow an 1800-calorie heart healthy diet, for example, this would mean 60 grams of fat or less per day.   Saturated fat and trans fat in your diet raises your blood cholesterol the most, much more than dietary cholesterol does. For this reason, on a heart-healthy diet, less than 7% of your calories should come from saturated fat and ideally 0% from trans fat. On an 1800-calorie diet, this translates into less than 14 grams of saturated fat per day, leaving 46 grams of fat to come from mono- and polyunsaturated fats.   Food Choices on a Heart Healthy Diet   Food Category   Foods Recommended   Foods to Avoid   Grains   Breads and rolls without salted tops Most dry and cooked cereals Unsalted crackers and breadsticks Low-sodium or homemade breadcrumbs or stuffing All rice and pastas   Breads, rolls, and crackers with salted tops High-fat baked goods (eg, muffins, donuts, pastries) Quick breads, self-rising flour, and biscuit mixes Regular bread crumbs Instant hot cereals Commercially prepared rice, pasta, or stuffing mixes   Vegetables   Most fresh, frozen, and low-sodium canned vegetables Low-sodium and salt-free vegetable juices Canned vegetables if unsalted or rinsed   Regular canned vegetables and juices, including sauerkraut and pickled vegetables Frozen vegetables with sauces Commercially prepared potato and vegetable mixes   Fruits   Most fresh, frozen, and canned fruits All fruit juices   Fruits processed with salt or sodium   Milk   Nonfat or low-fat (1%) milk Nonfat or low-fat yogurt Cottage cheese, low-fat ricotta, cheeses labeled  as low-fat and low-sodium   Whole milk Reduced-fat (2%) milk Malted and chocolate milk Full fat yogurt Most cheeses (unless low-fat and low salt) Buttermilk (no more than 1 cup per week)   Meats and Beans   Lean cuts of fresh or frozen beef, veal, lamb, or pork (look for the word loin) Fresh or frozen poultry without the skin Fresh or frozen fish and some shellfish Egg whites and egg substitutes (Limit whole eggs to three per week) Tofu Nuts or seeds (unsalted, dry-roasted), low-sodium peanut butter Dried peas, beans, and lentils   Any smoked, cured, salted, or canned meat, fish, or poultry (including bacon, chipped beef, cold cuts, hot dogs, sausages, sardines, and anchovies) Poultry skins Breaded and/or fried fish or meats Canned peas, beans, and lentils Salted nuts   Fats and Oils   Olive oil and canola oil Low-sodium, low-fat salad dressings and mayonnaise   Butter, margarine, coconut and palm oils, bacon fat   Snacks, Sweets, and Condiments   Low-sodium or unsalted versions of broths, soups, soy sauce, and condiments Pepper, herbs, and spices; vinegar, lemon, or lime juice Low-fat frozen desserts (yogurt, sherbet, fruit bars) Sugar, cocoa powder, honey, syrup, jam, and preserves Low-fat, trans-fat free cookies, cakes, and pies Graham and animal crackers, fig bars, ginger snaps   High-fat desserts Broth, soups, gravies, and sauces, made from instant  mixes or other high-sodium ingredients Salted snack foods Canned olives Meat tenderizers, seasoning salt, and most flavored vinegars   Beverages   Low-sodium carbonated beverages Tea and coffee in moderation Soy milk   Commercially softened water   Suggestions   Make whole grains, fruits, and vegetables the base of your diet.    Choose heart-healthy fats such as canola, olive, and flaxseed oil, and foods high in heart-healthy fats, such as nuts, seeds, soybeans, tofu, and fish.    Eat fish at least twice per week; the fish highest in omega-3 fatty acids and lowest in  mercury include salmon, herring, mackerel, sardines, and canned chunk light tuna. If you eat fish less than twice per week or have high triglycerides, talk to your doctor about taking fish oil supplements.    Read food labels.   For products low in fat and cholesterol, look for fat free, low-fat, cholesterol free, saturated fat free, and trans fat freeAlso scan the Nutrition Facts Label, which lists saturated fat, trans fat, and cholesterol amounts.   For products low in sodium, look for sodium free, very low sodium, low sodium, no added salt, and unsalted   Skip the salt when cooking or at the table; if food needs more flavor, get creative and try out different herbs and spices. Garlic and onion also add substantial flavor to foods.    Trim any visible fat off meat and poultry before cooking, and drain the fat off after browning.    Use cooking methods that require little or no added fat, such as grilling, boiling, baking, poaching, broiling, roasting, steaming, stir-frying, and sauting.    Avoid fast food and convenience food. They tend to be high in saturated and trans fat and have a lot of added salt.    Talk to a registered dietitian for individualized diet advice.      Last Reviewed: March 2011 Hadassah Clause, MS, MPH, RD   Updated: 02/24/2010     High-Fiber Diet     What Is Fiber?   Dietary fiber is a form of carbohydrate found in plants that cannot be digested by humans. All plants contain fiber, including fruits, vegetables, grains, and legumes. Fiber is often classified into two categories: soluble and insoluble.   Soluble fiber draws water into the bowel and can help slow digestion. Examples of foods that are high in soluble fiber include oatmeal, oat bran, barley, legumes (eg, beans and peas), apples, and strawberries.   Insoluble fiber speeds digestion and can add bulk to the stool. Examples of foods that are high in insoluble fiber include whole-wheat products, wheat bran, cauliflower, green beans, and  potatoes.   Why Follow a High-Fiber Diet?   A high-fiber diet is often recommended to prevent and treat constipation , hemorrhoids , diverticulitis , and irritable bowel syndrome . Eating a high-fiber diet can also help improve your cholesterol levels, lower your risk of coronary heart disease , reduce your risk of type 2 diabetes , and lower your weight. For people with type 1 or 2 diabetes, a high-fiber diet can also help stabilize blood sugar levels.   How Much Fiber Should I Eat?   A high-fiber diet should contain  20-35 grams  of fiber a day. This is actually the amount recommended for the general adult population; however, most Americans eat only 15 grams of fiber per day.   Digestion of Fiber   Eating a higher fiber diet than usual can take some getting used to by your  body's digestive system. To avoid the side effects of sudden increases in dietary fiber (eg, gas, cramping, bloating, and diarrhea), increase fiber gradually and be sure to drink plenty of fluids every day.   Tips for Increasing Fiber Intake   Whenever possible, choose whole grains over refined grains (eg, brown rice instead of white rice, whole-wheat bread instead of white bread).    Include a variety of grains in your diet, such as wheat, rye, barley, oats, quinoa, and bulgur.    Eat more vegetarian-based meals. Here are some ideas: black bean burgers, eggplant lasagna, and veggie tofu stir-fry.    Choose high-fiber snacks, such as fruits, popcorn, whole-grain crackers, and nuts.    Make whole-grain cereal or whole-grain toast part of your daily breakfast regime.    When eating out, whether ordering a sandwich or dinner, ask for extra vegetables.    When baking, replace part of the white flour with whole-wheat flour. Whole-wheat flour is particularly easy to incorporate into a recipe.    High-Fiber Diet Eating Guide   Food Category   Foods Recommended   Notes   Grains   Whole-grain breads, muffins, bagels, or pita bread Rye bread Whole-wheat  crackers or crisp breads Whole-grain or bran cereals Oatmeal, oat bran, or grits Wheat germ Whole-wheat pasta and brown rice   Read the ingredients list on food labels. Look for products that list whole as the first ingredient (eg, whole-wheat, whole oats). Choose cereals with at least 2 grams of fiber per serving.   Vegetables   All vegetables, especially asparagus, bean sprouts, broccoli, Brussels sprouts, cabbage, carrots, cauliflower, celery, corn, greens, green beans, green pepper, onions, peas, potatoes (with skin), snow peas, spinach, squash, sweet potatoes, tomatoes, zucchini   For maximum fiber intake, eat the peels of fruits and vegetablesjust be sure to wash them well first.   Fruits   All fruits, especially apples, berries, grapefruits, mangoes, nectarines, oranges, peaches, pears, dried fruits (figs, dates, prunes, raisins)   Choose raw fruits and vegetables over juice, cooked, or cannedraw fruit has more fiber. Dried fruit is also a good source of fiber.   Milk   With the exception of yogurt containing inulin (a type of fiber), dairy foods provide little fiber.   Add more fiber by topping your yogurt or cottage cheese with fresh fruit, whole grain or bran cereals, nuts, or seeds.   Meats and Beans   All beans and peas, especially Garbanzo beans, kidney beans, lentils, lima beans, split peas, and pinto beans All nuts and seeds, especially almonds, peanuts, Brazil nuts, cashews, peanut butter, walnuts, sesame and sunflower seeds All meat, poultry, fish, and eggs   Increase fiber in meat dishes by adding pinto beans, kidney beans, black-eyed peas, bran, or oatmeal. If you are following a low-fat diet, use nuts and seeds only in moderation.   Fats and Oils   All in moderation   Fats and oils do not provide fiber   Snacks, Sweets, and Condiments   Fruit Nuts Popcorn, whole-wheat pretzels, or trail mix made with dried fruits, nuts, and seeds Cakes, breads, and cookies made with oatmeal or whole-wheat flour    Most snack foods do not provide much fiber. Choose snacks with at least 2 grams of fiber per serving.     Last Reviewed: March 2011 Hadassah Clause, MS, MPH, RD   Updated: 02/24/2010     Keep Your Memory [place]       Many factors can affect your ability to remembera  hectic lifestyle, aging, stress, chronic disease, and certain medicines. But, there are steps you can take to sharpen your mind and help preserve your memory.   Challenge Your Brain   Regularly challenging your mind may help keeps it in top shape. Good mental exercises include:   Crossword puzzlesUse a dictionary if you need it; you will learn more that way.   Brainteasers Try some!   Crafts, such as wood working and Microbiologist, such as gardening and Teacher, English As A Foreign Language old friends or join groups to meet new ones.   Reading   Learning a new language   Taking a class, whether it be art history or tai chi   TravelingExperience the food, history, and culture of your destination   Learning to use a computer   Going to museums, the theater, or thought-provoking movies   Changing things in your daily life, such as reversing your pattern in the grocery store or brushing your teeth using your nondominant hand   Use Memory Aids   There is no need to remember every detail on your own. These memory aids can help:   Calendars and day planners   Electronic organizers to store all sorts of helpful informationThese devices can beep to remind you of appointments.   A book of days to record birthdays, anniversaries, and other occasions that occur on the same date every year   Detailed to-do lists and strategically placed sticky notes   Quick study sessionsBefore a gathering, review who will be there so their names will be fresh in your mind.   Establish routinesFor example, keep your keys, wallet, and umbrella in the same place all the time or take medicine with your 8:00 AM glass of juice   Live a Healthy Life   Many actions that will  keep your body strong will do the same for your mind. For example:   Talk to Your Doctor About Herbs and Supplements    Malnutrition and vitamin deficiencies can impair your mental function. For example, vitamin B12 deficiency can cause a range of symptoms, including confusion. But, what if your nutritional needs are being met? Can herbs and supplements still offer a benefit? Researchers have investigated a range of natural remedies, such as ginkgo , ginseng , and the supplement phosphatidylserine (PS). So far, though, the evidence is inconsistent as to whether these products can improve memory or thinking.   If you are interested in taking herbs and supplements, talk to your doctor first because they may interact with other medicines that you are taking.   Exercise Regularly    Among the many benefits of regular exercise are increased blood flow to the brain and decreased risk of certain diseases that can interfere with memory function. One study found that even moderate exercise has a beneficial effect. Examples of moderate exercise include:   Playing 18 holes of golf once a week, without a cart   Playing tennis twice a week   Walking one mile per day   Manage Stress    It can be tough to remember what is important when your mind is cluttered. Make time for relaxation. Choose activities that calm you down, and make it routine.   Manage Chronic Conditions    Side effects of high blood pressure , diabetes, and heart disease can interfere with mental function. Many of the lifestyle steps discussed here can help manage these conditions. Strive to eat a healthy diet, exercise regularly, get stress  under control, and follow your doctor's advice for your condition.   Minimize Medications    Talk to your doctor about the medicines that you take. Some may be unnecessary. Also, healthy lifestyle habits may lower the need for certain drugs.     Last Reviewed: April 2010 Redell Counts, MD   Updated: 03/11/2009          A  Healthy Lifestyle: Care Instructions  A healthy lifestyle can help you feel good, have more energy, and stay at a weight that's healthy for you. You can share a healthy lifestyle with your friends and family. And you can do it on your own.    Eat meals with your friends or family. You could try cooking together.   Plan activities with other people. Go for a walk with a friend, try a free online fitness class, or join a sports league.     Eat a variety of healthy foods. These include fruits, vegetables, whole grains, low-fat dairy, and lean protein.   Choose healthy portions of food. You can use the Nutrition Facts label on food packages as a guide.     Eat more fruits and vegetables. You could add vegetables to sandwiches or add fruit to cereal.   Drink water when you are thirsty. Limit soda, juice, and sports drinks.     Try to exercise most days. Aim for at least 2 hours of exercise each week.   Keep moving. Work in the garden or take your dog on a walk. Use the stairs instead of the elevator.     If you use tobacco or nicotine, try to quit. Ask your doctor about programs and medicines to help you quit.   Limit alcohol. Men should have no more than 2 drinks a day. Women should have no more than 1. For some people, no alcohol is the best choice.   Follow-up care is a key part of your treatment and safety. Be sure to make and go to all appointments, and call your doctor if you are having problems. It's also a good idea to know your test results and keep a list of the medicines you take.  Where can you learn more?  Go to [employer] and enter U807 to learn more about A Healthy Lifestyle: Care Instructions.  Current as of: July 04, 2022  Content Version: 14.1   2006-2024 [employer].   Care instructions adapted under license by [facility]. If you have questions about a medical condition or this instruction, always ask your healthcare professional. [employer]  disclaims any warranty or liability for your use of this information.        Safer Sex: After Your Visit  Your Care Instructions  Safer sex is a way to reduce your risk of getting an infection spread through sex. It can also help prevent pregnancy. Most infections that are spread through sex, also called sexually transmitted infections or STIs, can be cured. But some can decrease your chances of getting pregnant if they are not treated early. Others, such as herpes, have no cure. And some, such as HIV, can be deadly.  Several products can help you practice safer sex and reduce your chance of STIs. One of the best is a condom. There are condoms for men and for women. The male condom is a tube of soft plastic with a closed end that is placed deep into the vagina. You can use a special rubber sheet (dental dam) for protection during oral sex.  Latex gloves can keep your hands from touching blood, semen, or other body fluids that can carry infections.  Remember that birth control methods such as diaphragms, IUDs, foams, and birth control pills do not stop you from getting STIs.  Follow-up care is a key part of your treatment and safety. Be sure to make and go to all appointments, and call your doctor if you are having problems. It's also a good idea to know your test results and keep a list of the medicines you take.  How can you care for yourself at home?  Think about getting shots to prevent hepatitis A and hepatitis B. These two diseases can be spread through sex. You also can get hepatitis A if you eat infected food.  Use condoms or male condoms each time and every time you have sex.  Learn the right way to use a male condom:  Condoms come in several sizes. Make sure you use the right size. A condom that is too small can break easily. A condom that is too big can slip off during sex. Use a new condom each time you have sex.  Be careful not to poke a hole in the condom when you open the wrapper.  Squeeze the tip  of the condom to keep out air.  Pull down the loose skin (foreskin) from the head of an uncircumcised penis.  While squeezing the tip of the condom, unroll it all the way down to the base of the firm penis.  Never use petroleum jelly (such as Vaseline), grease, hand lotion, baby oil, or anything with oil in it. These products can make holes in the condom.  After sex, hold the condom on your penis as you remove your penis from your partner. This will keep semen from spilling out of the condom.  Learn to use a male condom:  You can put in a male condom up to 8 hours before sex.  Squeeze the smaller ring at the closed end and insert it deep into the vagina. The larger ring at the open end should stay outside the vagina.  During sex, make sure the penis goes into the condom.  After the penis is removed, close the open end of the condom by twisting it. Remove the condom.  Do not use a male condom and male condom at the same time.  Do not have sex with anyone who has symptoms of an STI, such as sores on the genitals or mouth. The herpes virus that causes cold sores can spread to and from the penis and vagina.  Do not drink a lot of alcohol or use drugs before sex. This can cause you to let down your guard and not practice safer sex.  Having one sex partner (who does not have STIs and does not have sex with anyone else) is a sure way to avoid STIs.  Talk to your partner before you have sex. Find out if he or she has or is at risk for any STI. Keep in mind that a person may be able to spread an STI even if he or she does not have symptoms. You and your partner may want to get an HIV test. You should get tested again 6 months later.     2006-2012 [employer]. Care instructions adapted under license by [facility]. This care instruction is for use with your licensed healthcare professional. If you have questions about a medical condition or this instruction, always ask your healthcare  professional. [employer] disclaims any warranty or liability for your use of this information.  Content Version: 9.4.94723; Last Revised: December 17, 2010

## 2023-08-29 NOTE — Assessment & Plan Note (Signed)
S/p right nephrectomy.  Retroperitoneal mass was wrapped around the right kidney.  We will obtain routine chemistries renal function today

## 2023-08-29 NOTE — Assessment & Plan Note (Signed)
 Patient comes in for wellness exam   we discussed age appropriate USPSTF screens  Over 75% of the visit was spent counselling patient on appropriate lifestyle choices.

## 2023-08-29 NOTE — Assessment & Plan Note (Signed)
Patient s/p resection of retroperitoneal mass working diagnosis liposarcoma (from first pathology report tissue biopsy) on 08/10/22  Patient saw [facility] oncologist 09/15/22-  Recommended  adjuvant therapy for 2 years with palbociclib Ilda Foil).   Patient will follow-up with [facility] oncologist Dr. Johny Blamer     Patient saw [facility] oncologist Dr. Hyman Bible -  "- We do recommend per our experience adjuvant therapy for 2 years with palbociclib.   - He started Ibrance on 10/11/23 with Dr. Johny Blamer - did have to hold for 5 weeks after C1 at 125mg  daily for prolonged neutropenia. Resumed on 12/05/22 at 75mg  daily d1-21 every 28 days. Agree with plan.  - Restaging scans today (12/08/22) personally reviewed showed no evidence of recurrent disease."    Patient will follow-up with oncologist per their orders

## 2023-08-29 NOTE — Progress Notes (Signed)
 Well Adult Note  Name: Trevor Crosby Date: 08/29/2023   MRN: [id number] Sex: Male   Age: 52 y.o. Ethnicity: Non-Hispanic / Non Latino   DOB: 02/06/1971 Race: Black / African American      [employer] is here for a well adult exam.       Subjective   History:  Wellness exam - Patient presents today for annual physical. Patient  reports feeling well. Patient has a normal appetite. Patient  eats 5+ servings of vegetables/fruits each day. Patient prepares food at home multiple times/day, eats in restaurants rarely. Patient  states that he sleeps 5-6 hours of  on average. In regards to emotional health, patient  denies depression or anxiety. Libido is considered to be normal. In regards to bowel habits, patient  has no complaints. Regarding urination, no complaints. Patient reports they feels safe at home, uses seat belts and has smoke detectors. Patient has a good work-life balance, and is happy with his job.     Health Maintenance    Annual retinal eye exam - Patient due  will need to schedule   Annual Dentist visit - Patient due  will need to schedule   Tobacco smoking  -  NO  Alcohol Misuse - NO  Illicit Drug Use- NO  Healthy Diet and physical activity -  YES  Obesity Screen- screened  08/25/2021  Wears seat belt- YES  End of life directives discussed with patient.-Mentions he does not have  a will/or/an advanced directives. Was instructed to start process looking into preparing his will an advanced directives.        Review of Systems   Constitutional:  Negative for activity change, appetite change, chills, fatigue, fever and unexpected weight change.   HENT:  Negative for congestion, ear pain, postnasal drip, sinus pressure, sore throat, tinnitus and trouble swallowing.    Eyes:  Negative for pain and redness.   Respiratory:  Negative for cough, chest tightness, shortness of breath and wheezing.    Cardiovascular:  Negative for chest pain, palpitations and leg swelling.   Gastrointestinal:  Negative for  abdominal distention, abdominal pain, blood in stool, constipation, diarrhea, nausea and vomiting.   Endocrine: Negative for cold intolerance, heat intolerance and polydipsia.   Genitourinary:  Negative for decreased urine volume, dysuria, flank pain, frequency, hematuria and urgency.   Musculoskeletal:  Negative for arthralgias, back pain, joint swelling, neck pain and neck stiffness.   Skin:  Negative for color change and rash.   Neurological:  Negative for dizziness, weakness, numbness and headaches.   Hematological:  Negative for adenopathy.   Psychiatric/Behavioral:  Negative for behavioral problems, sleep disturbance and suicidal ideas. The patient is not nervous/anxious.        No Known Allergies  Prior to Visit Medications    Medication Sig Taking? Authorizing Provider   VERZENIO 50 MG TABS  Yes [provider]   Probiotic Product (PROBIOTIC DAILY PO) Take by mouth Yes [provider]   Multiple Vitamin (ONE-A-DAY MENS) TABS Take 1 tablet by mouth daily Yes [provider]   Cholecalciferol (VITAMIN D3) 50 MCG (2000 UT) TABS Take 1 tablet by mouth daily (with breakfast)  Patient not taking: Reported on 05/06/2023  Thoams Siefert O, MD   [facility] 125 MG tablet   [provider]     No past medical history on file.  Past Surgical History:   Procedure Laterality Date    ABDOMINAL MASS RESECTION N/A 08/10/2022    COLONOSCOPY  2016  probably    Most likely -No polyps    CT BIOPSY ABDOMEN RETROPERITONEUM  06/23/2022    CT BIOPSY ABDOMEN RETROPERITONEUM 06/23/2022 [facility] CT SCAN    FACIAL COSMETIC SURGERY      fx facial bone ; fracture with basketball injury     Family History   Problem Relation Age of Onset    Colon Cancer Mother 42    High Blood Pressure Father      Social History     Tobacco Use    Smoking status: Never    Smokeless tobacco: Never   Vaping Use    Vaping status: Never Used   Substance Use Topics    Alcohol use: Yes     Alcohol/week: 2.0 standard drinks of alcohol      Types: 2 Cans of beer per week    Drug use: Never           Objective   Vital Signs  BP 120/82 (Site: Left Upper Arm, Position: Sitting, Cuff Size: Large Adult)   Pulse 65   Temp 97.7 F (36.5 C) (Infrared)   Resp 16   Ht 1.765 m (5' 9.5)   Wt 77.2 kg (170 lb 3.2 oz)   SpO2 98%   BMI 24.77 kg/m     Wt Readings from Last 3 Encounters:   08/29/23 77.2 kg (170 lb 3.2 oz)   05/06/23 76.7 kg (169 lb)   12/31/22 73.6 kg (162 lb 3.2 oz)       Physical Exam  Constitutional:       General: He is not in acute distress.     Appearance: Normal appearance. He is not ill-appearing.   HENT:      Head: Normocephalic and atraumatic.      Right Ear: Ear canal normal.      Left Ear: Ear canal normal.      Mouth/Throat:      Mouth: Mucous membranes are moist.      Pharynx: No oropharyngeal exudate or posterior oropharyngeal erythema.   Eyes:      Extraocular Movements: Extraocular movements intact.      Conjunctiva/sclera: Conjunctivae normal.      Pupils: Pupils are equal, round, and reactive to light.   Neck:      Vascular: No carotid bruit.   Cardiovascular:      Rate and Rhythm: Normal rate and regular rhythm.      Pulses: Normal pulses.      Heart sounds: Normal heart sounds. No murmur heard.     No gallop.   Pulmonary:      Effort: Pulmonary effort is normal.      Breath sounds: Normal breath sounds. No wheezing, rhonchi or rales.   Abdominal:      General: Abdomen is flat. There is no distension.   Musculoskeletal:         General: No swelling or tenderness. Normal range of motion.      Cervical back: No tenderness.   Lymphadenopathy:      Cervical: No cervical adenopathy.   Skin:     Findings: No erythema, lesion or rash.   Neurological:      General: No focal deficit present.      Mental Status: He is alert and oriented to person, place, and time. Mental status is at baseline.      Cranial Nerves: No cranial nerve deficit.      Motor: No weakness.   Psychiatric:  Mood and Affect: Mood normal.         Behavior:  Behavior normal.         Thought Content: Thought content normal.         Judgment: Judgment normal.             Assessment & Plan   Encounter for well adult exam without abnormal findings  Assessment & Plan:   Patient comes in for wellness exam   we discussed age appropriate USPSTF screens  Over 75% of the visit was spent counselling patient on appropriate lifestyle choices.     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   Patient will follow-up with [facility] oncologist Dr. Donato     Patient saw [facility] oncologist Dr. JONETTA Amor -  - We do recommend per our experience adjuvant therapy for 2 years with palbociclib.   - He started Ibrance on 10/11/23 with Dr. Donato - did have to hold for 5 weeks after C1 at 125mg  daily for prolonged neutropenia. Resumed on 12/05/22 at 75mg  daily d1-21 every 28 days. Agree with plan.  - Restaging scans today (12/08/22) personally reviewed showed no evidence of recurrent disease.    Patient will follow-up with oncologist per their orders  Pure hypercholesterolemia  Assessment & Plan:   Stable.  Treats with lifestyle changes low-fat diet an exercise.  Will recheck fasting lipid panel.  H/O right nephrectomy  Assessment & Plan:   S/p right nephrectomy.  Retroperitoneal mass was wrapped around the  right kidney.  We will obtain routine chemistries renal function today  Orders:  -     Comprehensive Metabolic Panel; Future  Screening PSA (prostate specific antigen)  -     PSA Screening; Future  Encounter for screening examination for sexually transmitted disease  -     Hepatitis B Surface Antigen; Future  -     HIV Screen; Future  -     Urine Trichomonas Evaluation; Future  -     C.trachomatis N.gonorrhoeae DNA, Urine; Future  -     Treponema Pallidum (Syphilis) Antibody; Future  Impaired glucose regulation  -     CBC with Auto Differential; Future  -     Comprehensive Metabolic Panel; Future  -     Hemoglobin A1C; Future  Screening cholesterol level  -     Lipid Panel; Future  Encounter for screening for HIV  -     HIV Screen; Future  Other fatigue  -     TSH with Reflex; Future  Vitamin D  deficiency  -  Vitamin D  25 Hydroxy; Future        Return in about 6 months (around 02/26/2024) for Routine follow-up, 15-minutes .     Personalized Preventive Plan  Current Health Maintenance Status  Immunization History   Administered Date(s) Administered    TDaP, ADACEL (age 70y-64y), MYRTICE (age 10y+), IM, 0.5mL 03/06/2010, 08/25/2021        Health Maintenance Due   Topic Date Due    Hepatitis B vaccine (1 of 3 - 19+ 3-dose series) Never done    Shingles vaccine (1 of 2) Never done    Flu vaccine (1) Never done    COVID-19 Vaccine (1 - 2023-24 season) Never done     Recommendations for Preventive Services Due: see orders and patient instructions/AVS.

## 2023-08-29 NOTE — Assessment & Plan Note (Signed)
Stable.  Treats with lifestyle changes low-fat diet an exercise.  Will recheck fasting lipid panel.

## 2023-08-30 ENCOUNTER — Encounter

## 2023-08-30 LAB — COMPREHENSIVE METABOLIC PANEL
ALT: 31 U/L (ref 10–40)
AST: 34 U/L (ref 15–37)
Albumin/Globulin Ratio: 1.6 (ref 1.1–2.2)
Albumin: 4.2 g/dL (ref 3.4–5.0)
Alkaline Phosphatase: 72 U/L (ref 40–129)
Anion Gap: 10 (ref 3–16)
BUN: 19 mg/dL (ref 7–20)
CO2: 26 mmol/L (ref 21–32)
Calcium: 9.5 mg/dL (ref 8.3–10.6)
Chloride: 103 mmol/L (ref 99–110)
Creatinine: 1.8 mg/dL — ABNORMAL HIGH (ref 0.9–1.3)
Est, Glom Filt Rate: 45 — AB (ref >60)
Glucose: 94 mg/dL (ref 70–99)
Potassium: 4.6 mmol/L (ref 3.5–5.1)
Sodium: 139 mmol/L (ref 136–145)
Total Bilirubin: 0.5 mg/dL (ref 0.0–1.0)
Total Protein: 6.9 g/dL (ref 6.4–8.2)

## 2023-08-30 LAB — VITAMIN D 25 HYDROXY: Vit D, 25-Hydroxy: 28.3 ng/mL — ABNORMAL LOW (ref >=30)

## 2023-08-30 LAB — LIPID PANEL
Cholesterol, Total: 202 mg/dL — ABNORMAL HIGH (ref 0–199)
HDL: 78 mg/dL — ABNORMAL HIGH (ref 40–60)
LDL Cholesterol: 113 mg/dL — ABNORMAL HIGH (ref <100)
Triglycerides: 56 mg/dL (ref 0–150)
VLDL Cholesterol Calculated: 11 mg/dL

## 2023-08-30 LAB — C.TRACHOMATIS N.GONORRHOEAE DNA, URINE
C. trachomatis DNA ,Urine: NEGATIVE
N. gonorrhoeae DNA, Urine: NEGATIVE

## 2023-08-30 LAB — CBC WITH AUTO DIFFERENTIAL
Basophils %: 2 %
Basophils Absolute: 0 10*3/uL (ref 0.0–0.2)
Eosinophils %: 5 %
Eosinophils Absolute: 0.1 10*3/uL (ref 0.0–0.6)
Hematocrit: 41.4 % (ref 40.5–52.5)
Hemoglobin: 13.5 g/dL (ref 13.5–17.5)
Lymphocytes %: 50 %
Lymphocytes Absolute: 1.1 10*3/uL (ref 1.0–5.1)
MCH: 27.9 pg (ref 26.0–34.0)
MCHC: 32.7 g/dL (ref 31.0–36.0)
MCV: 85.3 fL (ref 80.0–100.0)
MPV: 9.3 fL (ref 5.0–10.5)
Monocytes %: 9 %
Monocytes Absolute: 0.2 10*3/uL (ref 0.0–1.3)
Neutrophils %: 34 %
Neutrophils Absolute: 0.7 10*3/uL — CL (ref 1.7–7.7)
PLATELET SLIDE REVIEW: ADEQUATE
Platelets: 191 10*3/uL (ref 135–450)
RBC: 4.86 M/uL (ref 4.20–5.90)
RDW: 13.3 % (ref 12.4–15.4)
WBC: 2.2 10*3/uL — ABNORMAL LOW (ref 4.0–11.0)

## 2023-08-30 LAB — HEMOGLOBIN A1C
Estimated Avg Glucose: 122.6 mg/dL
Hemoglobin A1C: 5.9 %

## 2023-08-30 LAB — HIV SCREEN
HIV ANTIGEN: NONREACTIVE
HIV Ag/Ab: NONREACTIVE
HIV-1 Antibody: NONREACTIVE
HIV-2 Ab: NONREACTIVE

## 2023-08-30 LAB — TRICHOMONAS BY EIA: TRICHOMONAS VAGINALIS SCREEN: NEGATIVE

## 2023-08-30 LAB — HEPATITIS B SURFACE ANTIGEN: Hep B S Ag Interp: NONREACTIVE

## 2023-08-30 LAB — BLOOD SMEAR REVIEW

## 2023-08-30 LAB — TSH REFLEX TO FT4: TSH: 1.93 u[IU]/mL (ref 0.27–4.20)

## 2023-08-30 LAB — PSA SCREENING: PSA: 2.17 ng/mL (ref 0.00–4.00)

## 2023-08-30 NOTE — Telephone Encounter (Signed)
 Trevor Crosby called from the [facility] lab to let Dr. Dan Humphreys know that the patient's neutrophils absolute count was 0.7.

## 2023-08-30 NOTE — Result Encounter Note (Signed)
 Please let patient know that his absolute neutrophil count was low at 0.7.  I would like him to follow-up with his hematologist Dr. Selene, Alm LABOR, MD.     His kidney function remains stable, but I would like to be proactive, and I would like him to establish with a nephrologist.  Please confirm whether he sees a nephrologist at [facility] if not we will refer him to 1 locally.    Bad cholesterol 113 from 112 a year ago.  Should be under 100, continue treatment with low-fat diet and exercise.    Patient prediabetic with A1c of 5.9 this should be 5.6 or less.  Patient could treat this with a low-carb diet and exercise.    Patient has a few results pending if he does not hear from us  that means  Negative.

## 2023-09-19 ENCOUNTER — Ambulatory Visit: Admit: 2023-09-19 | Discharge: 2023-09-19 | Payer: PRIVATE HEALTH INSURANCE | Primary: Internal Medicine

## 2023-09-19 DIAGNOSIS — Z905 Acquired absence of kidney: Secondary | ICD-10-CM

## 2023-09-19 NOTE — Addendum Note (Signed)
Addended byNonda Lou on: 09/19/2023 01:34 PM     Modules accepted: Orders

## 2023-09-19 NOTE — Progress Notes (Addendum)
Jerene Dilling MD   Allegra Grana MD FASN FNKF  Alisia Ferrari MD FACP   Nonda Lou MD     Nephrology Consult Note    ASSESSMENT & PLAN    DIAGNOSIS    ICD-10-CM    1. H/O right nephrectomy  Z90.5 Renal Function Panel     Microalbumin / Creatinine Urine Ratio     Microscopic Urinalysis     US RENAL COMPLETE     Vitamin D 25 Hydroxy     PTH, Intact      2. 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 Solitary kidney, acquired  Z90.5       4. 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 male with CKD 3a/b from Solitary kidney. Patient has baseline creatinine of 1.8 with eGFR of 45, albuminuria of ? mg per gram of creatinine. Additional history includes retroperitoneal sarcoma, s/p ex lap, removal of large right retroperitoneal tumor, cystourethroscopy, and right nephrectomy on 08/10/22. He is on Verzenio which is cyclin-dependent kinases (CDK) 4 and 6 inhibitor, which gets activated by binding to D-cyclins. The drug inhibits the growth of cancer cells, and disrupts the cell cycle. Abemaciclib Inhibits Renal Tubular Secretion Without Changing Glomerular Filtration Rate. He is an Art gallery manager.        ASSESSMENT & PLAN   Kidney functions of Ritesh Agha are stable on review of recent lab work up.  We will screen him for proteinuria and with renal imaging, urine analysis. We will also check Cystatin C as Verzenio can affect renal tubular creatinine secretion.  Recent K is in normal range.   Blood pressure is in fair control at home.  There is No leg swelling.  No reported lower urinary tract symptoms (LUTS).  CKD education provided.  We will be happy to see Druscilla Brownie back in 1 months to follow up on ordered labs, kidney functions, BP and volume status.      Reason for Consult:  ckd    Chief Complaint:  ckd  History Obtained From:  Patient     History of Present Illness:    This is a 52 y.o. male who presents to the office for evaluation of   ckd.    Pt denies any hx of heavy or prolonged NSAID use.   There is no hx of jaundice or hepatitis or sexually transmitted disease.   Pt has no hx of collagen vascular disease or vasculitis.  No history of Lower urinary tract symptoms  Pt denies any hx of recurrent UTI  Denies nephrolithiasis.  No tea coloured urine.    Past Medical History:    No past medical history on file.    Past Surgical History:        Procedure Laterality Date    ABDOMINAL MASS RESECTION N/A 08/10/2022    COLONOSCOPY  2016 probably    Most likely -No polyps    CT BIOPSY ABDOMEN RETROPERITONEUM  06/23/2022    CT BIOPSY ABDOMEN RETROPERITONEUM 06/23/2022 [facility] CT SCAN    FACIAL COSMETIC SURGERY      fx facial bone ; fracture with basketball injury       Current Medications:    Current Outpatient Medications   Medication Sig Dispense Refill    VERZENIO 50 MG TABS       Probiotic Product (PROBIOTIC DAILY PO) Take by mouth  Multiple Vitamin (ONE-A-DAY MENS) TABS Take 1 tablet by mouth daily      Cholecalciferol (VITAMIN D3) 50 MCG (2000 UT) TABS Take 1 tablet by mouth daily (with breakfast) (Patient not taking: Reported on 05/06/2023) 90 tablet 3    IBRANCE 125 MG tablet  (Patient not taking: Reported on 09/30/2022)       No current facility-administered medications for this visit.       Allergies:  Patient has no known allergies.    Social History:   Social History     Socioeconomic History    Marital status: Divorced     Spouse name: Not on file    Number of children: Not on file    Years of education: Not on file    Highest education level: Not on file   Occupational History    Not on file   Tobacco Use    Smoking status: Never    Smokeless tobacco: Never   Vaping Use    Vaping status: Never Used   Substance and Sexual Activity    Alcohol use: Yes     Alcohol/week: 2.0 standard drinks of alcohol     Types: 2 Cans of beer per week    Drug use: Never    Sexual activity: Yes     Partners: Female   Other Topics Concern    Not on file   Social  History Narrative    Not on file     Social Determinants of Health     Financial Resource Strain: Low Risk  (08/29/2023)    Overall Financial Resource Strain (CARDIA)     Difficulty of Paying Living Expenses: Not hard at all   Food Insecurity: No Food Insecurity (08/29/2023)    Hunger Vital Sign     Worried About Running Out of Food in the Last Year: Never true     Ran Out of Food in the Last Year: Never true   Transportation Needs: Unknown (08/29/2023)    PRAPARE - Therapist, art (Medical): Not on file     Lack of Transportation (Non-Medical): No   Physical Activity: Unknown (01/01/2019)    Exercise Vital Sign     Days of Exercise per Week: 2 days     Minutes of Exercise per Session: Not on file   Stress: Not on file   Social Connections: Not on file   Intimate Partner Violence: Not on file   Housing Stability: Unknown (08/29/2023)    Housing Stability Vital Sign     Unable to Pay for Housing in the Last Year: Not on file     Number of Times Moved in the Last Year: Not on file     Homeless in the Last Year: No       Family History:   Family History   Problem Relation Age of Onset    Colon Cancer Mother 13    High Blood Pressure Father        Review of Systems:    Constitutional: No fever, no chills, no lethargy, no weakness.  HEENT:  No headache, otalgia, itchy eyes, nasal discharge or sore throat.  Cardiac:  No chest pain, dyspnea, orthopnea or PND.  Chest:  No cough, phlegm or wheezing.  Abdomen:  No abdominal pain, nausea or vomiting.  Neuro:  No focal weakness, abnormal movements orseizure like activity.  Skin:   No rashes, no itching.  GU:   No hematuria, no pyuria, no dysuria, no  flank pain.  Extremities:  No swelling or joint pains.      Objective:  BP 135/83   Pulse 58   Wt 78.5 kg (173 lb)   BMI 25.18 kg/m     Physical Exam:  General appearance:Awake, alert, in no acute distress  Skin: warm and dry, no rash or erythema  Eyes: conjunctivae normal and sclera anicteric  ENT: :no  thrush no pharyngeal congestion    Neck: without any JVD. No carotid bruits or thyromegaly. No  Lymphadenopathty:  Pulmonary: lungs are clear and without any wheezing or rhonchi   Cardiovascular:Normal S1 & S2, No S3 or  S4, No  Pericardial rub , No Murmur Abdomen: soft nontender, bowel sounds present, no organomegaly,  No ascites  Extremities: no cyanosis, clubbing or edema    Labs:    CBC:   Lab Results   Component Value Date/Time    WBC 2.2 08/29/2023 09:06 AM    RBC 4.86 08/29/2023 09:06 AM    HGB 13.5 08/29/2023 09:06 AM    HCT 41.4 08/29/2023 09:06 AM    MCV 85.3 08/29/2023 09:06 AM    RDW 13.3 08/29/2023 09:06 AM    PLT 191 08/29/2023 09:06 AM    MPV 9.3 08/29/2023 09:06 AM      BMP:   Lab Results   Component Value Date/Time    NA 139 08/29/2023 09:06 AM    NA 141 05/06/2023 08:52 AM    NA 141 01/07/2023 08:08 AM    K 4.6 08/29/2023 09:06 AM    K 4.4 05/06/2023 08:52 AM    K 4.7 01/07/2023 08:08 AM    K 3.9 06/23/2022 07:03 AM    K 4.8 06/22/2022 10:52 AM    CL 103 08/29/2023 09:06 AM    CL 104 05/06/2023 08:52 AM    CL 105 01/07/2023 08:08 AM    CO2 26 08/29/2023 09:06 AM    CO2 26 05/06/2023 08:52 AM    CO2 26 01/07/2023 08:08 AM    BUN 19 08/29/2023 09:06 AM    BUN 18 05/06/2023 08:52 AM    BUN 16 01/07/2023 08:08 AM    CREATININE 1.8 08/29/2023 09:06 AM    CREATININE 1.7 05/06/2023 08:52 AM    CREATININE 1.4 01/07/2023 08:08 AM    GLUCOSE 94 08/29/2023 09:06 AM    GLUCOSE 99 05/06/2023 08:52 AM    GLUCOSE 84 01/07/2023 08:08 AM    CALCIUM 9.5 08/29/2023 09:06 AM    CALCIUM 9.9 05/06/2023 08:52 AM    CALCIUM 9.3 01/07/2023 08:08 AM      BNP:No results found for: "BNP"  PHOSPHORUS:    Lab Results   Component Value Date/Time    PHOS 3.6 05/06/2023 08:52 AM    PHOS 3.5 01/07/2023 08:08 AM     MAGNESIUM: No results found for: "MG"  ALBUMIN: No results found for: "LABALBU"  IRON:    Lab Results   Component Value Date/Time    IRON 33 06/22/2022 10:52 AM     IRON SATURATION:  No components found for:  "LABIRON"  TIBC:    Lab Results   Component Value Date/Time    TIBC 208 06/22/2022 10:52 AM     FERRITIN:  No results found for: "FERRITIN"  ANA: No results found for: "ANA"    SPEP: No results found for: "ALBCAL", "ALBPCT", "LABALPH", "A1PCT", "A2PCT", "LABBETA", "BETAPCT", "GAMGLOB", "GGPCT", "PATH"  UPEP: No results found for: "TPU"   HEPBSAG:No results found for: "HEPBSAG"  HEPCAB:No results  found for: "HEPCAB"  C3: No results found for: "C3"  C4: No results found for: "C4"  MPO ANCA: No results found for: "MPO" .  PR3 ANCA:  No results found for: "PR3"  PTH: No results found for: "PTH"    Urine Creatinine:  No results found for: "LABCREA"  Urine Eosinophils: No results found for: "UREO"  Urine Protein:  No results found for: "TPU"  Urinalysis:  U/A:   Lab Results   Component Value Date/Time    NITRU Negative 06/22/2022 11:20 AM    COLORU Yellow 06/22/2022 11:20 AM    PHUR 6.0 06/22/2022 11:20 AM    WBCUA 1 06/22/2022 11:20 AM    RBCUA 1 06/22/2022 11:20 AM    BACTERIA None Seen 06/22/2022 11:20 AM    CLARITYU Clear 06/22/2022 11:20 AM    LEUKOCYTESUR Negative 06/22/2022 11:20 AM    UROBILINOGEN 1.0 06/22/2022 11:20 AM    BILIRUBINUR Negative 06/22/2022 11:20 AM    BLOODU Negative 06/22/2022 11:20 AM    GLUCOSEU Negative 06/22/2022 11:20 AM    KETUA TRACE 06/22/2022 11:20 AM       Radiology:  Reviewed as available.    Thank you for the new consult with [facility]). Please do not hesitate to contact us with questions. Electronically signed by Nonda Lou, MD on 09/19/2023 at 1:33 PM  Office : [phone]  Fax [phone]

## 2023-10-07 ENCOUNTER — Inpatient Hospital Stay: Admit: 2023-10-07 | Payer: PRIVATE HEALTH INSURANCE | Primary: Internal Medicine

## 2023-10-07 DIAGNOSIS — Z905 Acquired absence of kidney: Secondary | ICD-10-CM

## 2023-10-21 ENCOUNTER — Encounter: Admit: 2023-10-21

## 2023-10-21 DIAGNOSIS — Z905 Acquired absence of kidney: Secondary | ICD-10-CM

## 2023-10-22 LAB — MICROSCOPIC URINALYSIS
Bacteria, UA: NONE SEEN /HPF
Epithelial Cells, UA: 0 /HPF (ref 0–5)
Hyaline Casts, UA: 0 /LPF (ref 0–8)
RBC, UA: 0 /HPF (ref 0–4)
WBC, UA: 0 /HPF (ref 0–5)

## 2023-10-22 LAB — RENAL FUNCTION PANEL
Albumin: 4.3 g/dL (ref 3.4–5.0)
Anion Gap: 7 (ref 3–16)
BUN: 19 mg/dL (ref 7–20)
CO2: 28 mmol/L (ref 21–32)
Calcium: 9.5 mg/dL (ref 8.3–10.6)
Chloride: 105 mmol/L (ref 99–110)
Creatinine: 1.7 mg/dL — ABNORMAL HIGH (ref 0.9–1.3)
Est, Glom Filt Rate: 48 — AB (ref >60)
Glucose: 95 mg/dL (ref 70–99)
Phosphorus: 3 mg/dL (ref 2.5–4.9)
Potassium: 4.7 mmol/L (ref 3.5–5.1)
Sodium: 140 mmol/L (ref 136–145)

## 2023-10-22 LAB — MICROALBUMIN / CREATININE URINE RATIO
Creatinine, Ur: 171 mg/dL (ref 39.0–259.0)
Microalb, Ur: <1.2 mg/dL (ref <2.0)

## 2023-10-22 LAB — PTH, INTACT: Pth Intact: 55 pg/mL (ref 14.0–72.0)

## 2023-10-22 LAB — VITAMIN D 25 HYDROXY: Vit D, 25-Hydroxy: 16.6 ng/mL — ABNORMAL LOW (ref >=30)

## 2023-10-24 LAB — CYSTATIN C
Creatinine: 1.87 mg/dL — ABNORMAL HIGH (ref 0.69–1.22)
Cystatin C: 0.98 mg/L — ABNORMAL HIGH (ref 0.61–0.95)
eGFR By Cystatin C: 63 (ref >=60)

## 2023-10-31 ENCOUNTER — Encounter: Admit: 2023-10-31 | Payer: PRIVATE HEALTH INSURANCE | Primary: Internal Medicine

## 2023-10-31 VITALS — BP 126/78 | HR 74 | Wt 175.6 lb

## 2023-10-31 DIAGNOSIS — Z905 Acquired absence of kidney: Secondary | ICD-10-CM

## 2023-10-31 MED ORDER — VITAMIN D (ERGOCALCIFEROL) 1.25 MG (50000 UT) PO CAPS
1.25 | ORAL_CAPSULE | ORAL | 0 refills | Status: DC
Start: 2023-10-31 — End: 2024-08-28

## 2023-10-31 NOTE — Progress Notes (Signed)
 Jerene Dilling MD   Allegra Grana MD FASN FNKF  Alisia Ferrari MD FACP   Nonda Lou MD     Nephrology Consult Note    ASSESSMENT & PLAN    DIAGNOSIS    ICD-10-CM    1. H/O right nephrectomy  Z90.5       2. Stage 3 chronic

## 2024-02-27 ENCOUNTER — Encounter

## 2024-02-27 ENCOUNTER — Ambulatory Visit
Admit: 2024-02-27 | Discharge: 2024-02-27 | Payer: PRIVATE HEALTH INSURANCE | Attending: Internal Medicine | Primary: Internal Medicine

## 2024-02-27 VITALS — BP 98/68 | HR 62 | Temp 98.00000°F | Resp 16 | Ht 69.5 in | Wt 172.6 lb

## 2024-02-27 DIAGNOSIS — C499 Malignant neoplasm of connective and soft tissue, unspecified: Secondary | ICD-10-CM

## 2024-02-27 NOTE — Assessment & Plan Note (Signed)
Stable.  Treats with lifestyle changes low-fat diet an exercise.  Will recheck fasting lipid panel.

## 2024-02-27 NOTE — Progress Notes (Signed)
 Trevor Crosby (May 07, 1971) is a 53 y.o. male   Follow-up (6 month)     General health:  This patient presents for check up and refills. The problem and medicine lists and chart were reviewed in detail. The patient has been worked up and treated for this/these condition/s and is compliant with taking the medication without any significant side effects. The patient's condition/s is/are chronic and unchanged and otherwise remains stable. Feels well with minor complaints.    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 Patient saw Trevor Crosby Crosby 09/15/22-  Recommended adjuvant therapy for 2 years.  Patient will follow-up with Trevor Crosby Crosby Trevor Crosby an Trevor Crosby Trevor Crosby.  Patient today patient feels fine, he is back at the gym.  Has no other complaints or symptoms today.     Other issues discussed     CKD -patient s/p right nephrectomy patient has no complaints, denies change in voiding urine, no leg swelling.  Mentions his blood pressure has been stable.  HLD  - Patient denies complaints or symptoms of chest pain/shortness of breath with exertion or at rest.   Patient follows a low-fat diet, tries to exercise.       Health Maintenance    Annual retinal eye exam - Patient due  will need to schedule   Annual Dentist visit - Patient due  will need to schedule   Tobacco smoking  -  NO  Alcohol Misuse - NO  Illicit Drug Use- NO  Healthy Diet and physical activity -  YES  Obesity Screen- screened  08/25/2021  Wears seat belt- YES  End of life directives discussed with patient.-Mentions he does not have  a will/or/an advanced directives. Was instructed to start process looking into preparing his will an advanced directives.   The 10-year ASCVD risk score (Arnett DK, et al., 2019) is: 3.2%    Values used to calculate the score:      Age: 68 years      Sex: Male      Is Non-Hispanic African  American: Yes      Diabetic: No      Tobacco smoker: No      Systolic Blood Pressure: 98 mmHg      Is BP treated: No      HDL Cholesterol: 78 mg/dL      Total Cholesterol: 202 mg/dL     Review of Systems   Constitutional:  Negative for activity change, appetite change, chills, fatigue, fever and unexpected weight change.   HENT:  Negative for congestion, ear pain, postnasal drip, sinus pressure, sore throat, tinnitus and trouble swallowing.    Eyes:  Negative for pain and redness.   Respiratory:  Negative for cough, chest tightness, shortness of breath and wheezing.    Cardiovascular:  Negative for chest pain, palpitations and leg swelling.   Gastrointestinal:  Negative for abdominal distention, abdominal pain, blood in stool, constipation, diarrhea, nausea and vomiting.   Endocrine: Negative for cold intolerance, heat intolerance and polydipsia.   Genitourinary:  Negative for decreased urine volume, dysuria, flank pain, frequency, hematuria and urgency.   Musculoskeletal:  Negative for arthralgias, back pain, joint swelling, neck pain and neck stiffness.   Skin:  Negative for color change and rash.   Neurological:  Negative for dizziness, weakness, numbness and headaches.   Hematological:  Negative for adenopathy.   Psychiatric/Behavioral:  Negative for behavioral problems, sleep disturbance  and suicidal ideas. The patient is not nervous/anxious.         BP 98/68 (BP Site: Left Upper Arm, Patient Position: Sitting, BP Cuff Size: Medium Adult)   Pulse 62   Temp 98 F (36.7 C) (Infrared)   Resp 16   Ht 1.765 m (5' 9.5")   Wt 78.3 kg (172 lb 9.6 oz)   SpO2 98%   BMI 25.12 kg/m    Physical Exam  Constitutional:       General: He is not in acute distress.     Appearance: Normal appearance. He is not ill-appearing.   HENT:      Head: Normocephalic and atraumatic.      Right Ear: Ear canal normal.      Left Ear: Ear canal normal.      Mouth/Throat:      Mouth: Mucous membranes are moist.      Pharynx: No  oropharyngeal exudate or posterior oropharyngeal erythema.   Eyes:      Extraocular Movements: Extraocular movements intact.      Conjunctiva/sclera: Conjunctivae normal.      Pupils: Pupils are equal, round, and reactive to light.   Neck:      Vascular: No carotid bruit.   Cardiovascular:      Rate and Rhythm: Normal rate and regular rhythm.      Pulses: Normal pulses.      Heart sounds: Normal heart sounds. No murmur heard.     No gallop.   Pulmonary:      Effort: Pulmonary effort is normal.      Breath sounds: Normal breath sounds. No wheezing, rhonchi or rales.   Abdominal:      General: Abdomen is flat. There is no distension.   Musculoskeletal:         General: No swelling or tenderness. Normal range of motion.      Cervical back: No tenderness.   Lymphadenopathy:      Cervical: No cervical adenopathy.   Skin:     Findings: No erythema, lesion or rash.   Neurological:      General: No focal deficit present.      Mental Status: He is alert and oriented to person, place, and time. Mental status is at baseline.      Cranial Nerves: No cranial nerve deficit.      Motor: No weakness.   Psychiatric:         Mood and Affect: Mood normal.         Behavior: Behavior normal.         Thought Content: Thought content normal.         Judgment: Judgment normal.         ASSESSMENT/PLAN:  1. 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 Resumed on 12/05/22 at 75mg  daily d1-21 every 28 days. Stopped Ibrance d/t prolonged neutropenia. Changed to abemaciclib 100mg  BID on 12/28/22. Due to prolonged neutropenia, changed to 50mg  BID and patient reports has been doing well since.   - Restaging scans today (12/13/22) personally  reviewed showed no evidence of recurrent disease. "    Patient will follow-up with Crosby per their orders  2.  H/O right nephrectomy  Assessment & Plan:   S/p right nephrectomy.  Retroperitoneal mass was wrapped around the right kidney.  We will obtain routine chemistries renal function today  3. CKD stage 3b, GFR 30-44 ml/min (HCC)  Assessment & Plan:  Patient will should avoid nephrotoxic drugs and supplements, adhere to low-salt diet.   Maintain blood pressure within normal range for age.  Continue follow-up with nephrologist   4. Pure hypercholesterolemia  Assessment & Plan:   Stable.  Treats with lifestyle changes low-fat diet an exercise.  Will recheck fasting lipid panel.      Preventative care discussed.  Encouraged healthy diet choices, reg exercise. Patient agreed w/plan and verbal understanding. Patient advised to call or return with any concerns or problems or worsening conditions. Go to the ER for any severe or potentially life threatening problems.    Return in about 6 months (around 08/28/2024) for Annual physical, 15 minutes .     This note was generated in part or in whole with voice recognition software.  Voice recognition is usually quite accurate but there are transcription errors that can often occur.  All attempts were made to correct these errors I apologized for any  typographical errors that were not detected and corrected.     Electronically signed by Montine Circle. Dan Humphreys, MD on 02/27/2024 at 9:07 AM.

## 2024-02-27 NOTE — Assessment & Plan Note (Addendum)
 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 Hyman Bible 12/13/22 -    " He started Ibrance on 10/10/22 with Dr. Johny Blamer - did have to hold for 5 weeks after C1 at 125mg  daily for prolonged neutropenia. Resumed on 12/05/22 at 75mg  daily d1-21 every 28 days. Stopped Ibrance d/t prolonged neutropenia. Changed to abemaciclib 100mg  BID on 12/28/22. Due to prolonged neutropenia, changed to 50mg  BID and patient reports has been doing well since.   - Restaging scans today (12/13/22) personally reviewed showed no evidence of recurrent disease. "    Patient will follow-up with oncologist per their orders

## 2024-02-27 NOTE — Assessment & Plan Note (Addendum)
 Patient will should avoid nephrotoxic drugs and supplements, adhere to low-salt diet.   Maintain blood pressure within normal range for age.  Continue follow-up with nephrologist

## 2024-02-27 NOTE — Assessment & Plan Note (Signed)
S/p right nephrectomy.  Retroperitoneal mass was wrapped around the right kidney.  We will obtain routine chemistries renal function today

## 2024-02-28 ENCOUNTER — Inpatient Hospital Stay: Payer: PRIVATE HEALTH INSURANCE | Primary: Internal Medicine

## 2024-02-28 DIAGNOSIS — N183 Chronic kidney disease, stage 3 unspecified (HCC): Secondary | ICD-10-CM

## 2024-02-28 LAB — SPECIMEN REJECTION

## 2024-02-28 LAB — RENAL FUNCTION PANEL
Albumin: 4.2 g/dL (ref 3.4–5.0)
Albumin: 4.2 g/dL (ref 3.4–5.0)
Anion Gap: 8 (ref 3–16)
Anion Gap: 9 (ref 3–16)
BUN: 12 mg/dL (ref 7–20)
BUN: 15 mg/dL (ref 7–20)
CO2: 28 mmol/L (ref 21–32)
CO2: 25 mmol/L (ref 21–32)
Calcium: 9.4 mg/dL (ref 8.3–10.6)
Calcium: 9.2 mg/dL (ref 8.3–10.6)
Chloride: 103 mmol/L (ref 99–110)
Chloride: 107 mmol/L (ref 99–110)
Creatinine: 1.6 mg/dL — ABNORMAL HIGH (ref 0.9–1.3)
Creatinine: 1.6 mg/dL — ABNORMAL HIGH (ref 0.9–1.3)
Est, Glom Filt Rate: 51 — AB (ref >60)
Est, Glom Filt Rate: 51 — AB (ref >60)
Glucose: 100 mg/dL — ABNORMAL HIGH (ref 70–99)
Glucose: 114 mg/dL — ABNORMAL HIGH (ref 70–99)
Phosphorus: 3.1 mg/dL (ref 2.5–4.9)
Phosphorus: 2.9 mg/dL (ref 2.5–4.9)
Potassium: 4.5 mmol/L (ref 3.5–5.1)
Potassium: 4.2 mmol/L (ref 3.5–5.1)
Sodium: 139 mmol/L (ref 136–145)
Sodium: 141 mmol/L (ref 136–145)

## 2024-02-29 LAB — ALBUMIN/CREATININE RATIO, URINE
Albumin Urine: <1.2 mg/dL (ref <2.0)
Creatinine, Ur: 237 mg/dL (ref 39.0–259.0)

## 2024-03-05 ENCOUNTER — Ambulatory Visit: Admit: 2024-03-05 | Discharge: 2024-03-05 | Payer: PRIVATE HEALTH INSURANCE | Primary: Internal Medicine

## 2024-03-05 VITALS — BP 141/74 | HR 63 | Wt 172.6 lb

## 2024-03-05 DIAGNOSIS — N183 Chronic kidney disease, stage 3 unspecified (HCC): Secondary | ICD-10-CM

## 2024-03-05 NOTE — Progress Notes (Signed)
 Trevor Dilling MD   Trevor Grana MD FASN FNKF  Trevor Ferrari MD FACP   Trevor Lou MD     Nephrology Consult Note    ASSESSMENT & PLAN    DIAGNOSIS    ICD-10-CM    1. Stage 3 chronic kidney disease, unspecified whether stage 3a or 3b CKD (HCC)  N18.30       2. H/O right nephrectomy  Z90.5       3. Solitary kidney, acquired  Z90.5       4. 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 Vitamin D deficiency  E55.9           SUMMARY:   Trevor Crosby is a 53 y.o. 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   Patient has baseline creatinine of 1.8 with eGFR of 45,   Cystatin C of 0.98 which gives him eGFR of 63  without albuminuria.   Additional history includes retroperitoneal sarcoma, s/p ex lap, removal of large right retroperitoneal tumor, cystourethroscopy, and right nephrectomy on 08/10/22.   He is on Verzenio which is cyclin-dependent kinases (CDK) 4 and 6 inhibitor, which gets activated by binding to D-cyclins. The drug inhibits the growth of cancer cells, and disrupts the cell cycle. Abemaciclib Inhibits Renal Tubular Secretion Without Changing Glomerular Filtration Rate.   He is an Art gallery manager.        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  There is No leg swelling.  No reported lower urinary tract symptoms (LUTS).  We will be happy to see Druscilla Brownie back in 6 months to follow up on ordered labs, kidney functions, BP and volume status.      Reason for Consult:  ckd    Chief Complaint:  ckd  History Obtained From:  Patient     History of Present Illness:    This is a 53 y.o. male who presents to the office for evaluation of  ckd.    Pt denies any hx of heavy or prolonged NSAID use.   There is no hx of jaundice or hepatitis or sexually transmitted disease.   Pt has no hx of  collagen vascular disease or vasculitis.  No history of Lower urinary tract symptoms  Pt denies any hx of recurrent UTI  Denies nephrolithiasis.  No tea coloured urine.    Past Medical History:    No past medical history on file.    Past Surgical History:        Procedure Laterality Date    ABDOMINAL MASS RESECTION N/A 08/10/2022    COLONOSCOPY  2016 probably    Most likely -No polyps    CT BIOPSY ABDOMEN RETROPERITONEUM  06/23/2022    CT BIOPSY ABDOMEN RETROPERITONEUM 06/23/2022 [facility] CT SCAN    FACIAL COSMETIC SURGERY      fx facial bone ; fracture with basketball injury       Current Medications:    Current Outpatient Medications   Medication Sig Dispense Refill    vitamin D (ERGOCALCIFEROL) 1.25 MG (50000 UT) CAPS capsule Take 1 capsule by mouth once a week 12 capsule 0    VERZENIO 50 MG TABS       Probiotic  Product (PROBIOTIC DAILY PO) Take by mouth      Multiple Vitamin (ONE-A-DAY MENS) TABS Take 1 tablet by mouth daily       No current facility-administered medications for this visit.       Allergies:  Patient has no known allergies.    Social History:   Social History     Socioeconomic History    Marital status: Divorced     Spouse name: Not on file    Number of children: Not on file    Years of education: Not on file    Highest education level: Not on file   Occupational History    Not on file   Tobacco Use    Smoking status: Never    Smokeless tobacco: Never   Vaping Use    Vaping status: Never Used   Substance and Sexual Activity    Alcohol use: Yes     Alcohol/week: 2.0 standard drinks of alcohol     Types: 2 Cans of beer per week    Drug use: Never    Sexual activity: Yes     Partners: Female   Other Topics Concern    Not on file   Social History Narrative    Not on file     Social Drivers of Health     Financial Resource Strain: Low Risk  (08/29/2023)    Overall Financial Resource Strain (CARDIA)     Difficulty of Paying Living Expenses: Not hard at all   Food Insecurity: No Food Insecurity (02/27/2024)     Hunger Vital Sign     Worried About Running Out of Food in the Last Year: Never true     Ran Out of Food in the Last Year: Never true   Transportation Needs: No Transportation Needs (02/27/2024)    PRAPARE - Therapist, art (Medical): No     Lack of Transportation (Non-Medical): No   Physical Activity: Unknown (01/01/2019)    Exercise Vital Sign     Days of Exercise per Week: 2 days     Minutes of Exercise per Session: Not on file   Stress: Not on file   Social Connections: Not on file   Intimate Partner Violence: Not on file   Housing Stability: Low Risk  (02/27/2024)    Housing Stability Vital Sign     Unable to Pay for Housing in the Last Year: No     Number of Times Moved in the Last Year: 0     Homeless in the Last Year: No       Family History:   Family History   Problem Relation Age of Onset    Colon Cancer Mother 24    High Blood Pressure Father        Review of Systems:    Constitutional: No fever, no chills, no lethargy, no weakness.  HEENT:  No headache, otalgia, itchy eyes, nasal discharge or sore throat.  Cardiac:  No chest pain, dyspnea, orthopnea or PND.  Chest:  No cough, phlegm or wheezing.  Abdomen:  No abdominal pain, nausea or vomiting.  Neuro:  No focal weakness, abnormal movements orseizure like activity.  Skin:   No rashes, no itching.  GU:   No hematuria, no pyuria, no dysuria, no flank pain.  Extremities:  No swelling or joint pains.      Objective:  BP (!) 141/74   Pulse 63   Wt 78.3 kg (172 lb 9.6 oz)  BMI 25.12 kg/m     Physical Exam:  General appearance:Awake, alert, in no acute distress  Skin: warm and dry, no rash or erythema  Eyes: conjunctivae normal and sclera anicteric  ENT: :no thrush no pharyngeal congestion    Neck: without any JVD. No carotid bruits or thyromegaly. No  Lymphadenopathty:  Pulmonary: lungs are clear and without any wheezing or rhonchi   Cardiovascular:Normal S1 & S2, No S3 or  S4, No  Pericardial rub , No Murmur Abdomen: soft nontender,  bowel sounds present, no organomegaly,  No ascites  Extremities: no cyanosis, clubbing or edema    Labs:    CBC:   Lab Results   Component Value Date/Time    WBC 2.2 08/29/2023 09:06 AM    RBC 4.86 08/29/2023 09:06 AM    HGB 13.5 08/29/2023 09:06 AM    HCT 41.4 08/29/2023 09:06 AM    MCV 85.3 08/29/2023 09:06 AM    RDW 13.3 08/29/2023 09:06 AM    PLT 191 08/29/2023 09:06 AM    MPV 9.3 08/29/2023 09:06 AM      BMP:   Lab Results   Component Value Date/Time    NA 141 02/28/2024 05:48 PM    NA 139 02/27/2024 09:17 AM    NA 140 10/21/2023 08:29 AM    K 4.2 02/28/2024 05:48 PM    K 4.5 02/27/2024 09:17 AM    K 4.7 10/21/2023 08:29 AM    K 3.9 06/23/2022 07:03 AM    K 4.8 06/22/2022 10:52 AM    CL 107 02/28/2024 05:48 PM    CL 103 02/27/2024 09:17 AM    CL 105 10/21/2023 08:29 AM    CO2 25 02/28/2024 05:48 PM    CO2 28 02/27/2024 09:17 AM    CO2 28 10/21/2023 08:29 AM    BUN 15 02/28/2024 05:48 PM    BUN 12 02/27/2024 09:17 AM    BUN 19 10/21/2023 08:29 AM    CREATININE 1.6 02/28/2024 05:48 PM    CREATININE 1.6 02/27/2024 09:17 AM    CREATININE 1.7 10/21/2023 08:29 AM    CREATININE 1.87 10/21/2023 08:29 AM    GLUCOSE 114 02/28/2024 05:48 PM    GLUCOSE 100 02/27/2024 09:17 AM    GLUCOSE 95 10/21/2023 08:29 AM    CALCIUM 9.2 02/28/2024 05:48 PM    CALCIUM 9.4 02/27/2024 09:17 AM    CALCIUM 9.5 10/21/2023 08:29 AM      BNP:No results found for: "BNP"  PHOSPHORUS:    Lab Results   Component Value Date/Time    PHOS 2.9 02/28/2024 05:48 PM    PHOS 3.1 02/27/2024 09:17 AM    PHOS 3.0 10/21/2023 08:29 AM     MAGNESIUM: No results found for: "MG"  ALBUMIN: No results found for: "LABALBU"  IRON:    Lab Results   Component Value Date/Time    IRON 33 06/22/2022 10:52 AM     IRON SATURATION:  No components found for: "LABIRON"  TIBC:    Lab Results   Component Value Date/Time    TIBC 208 06/22/2022 10:52 AM     FERRITIN:  No results found for: "FERRITIN"  ANA: No results found for: "ANA"    SPEP: No results found for: "ALBCAL",  "ALBPCT", "LABALPH", "A1PCT", "A2PCT", "LABBETA", "BETAPCT", "GAMGLOB", "GGPCT", "PATH"  UPEP: No results found for: "TPU"   HEPBSAG:No results found for: "HEPBSAG"  HEPCAB:No results found for: "HEPCAB"  C3: No results found for: "C3"  C4: No results found for: "  C4"  MPO ANCA: No results found for: "MPO" .  PR3 ANCA:  No results found for: "PR3"  PTH: No results found for: "PTH"    Urine Creatinine:  No results found for: "LABCREA"  Urine Eosinophils: No results found for: "UREO"  Urine Protein:  No results found for: "TPU"  Urinalysis:  U/A:   Lab Results   Component Value Date/Time    NITRU Negative 06/22/2022 11:20 AM    COLORU Yellow 06/22/2022 11:20 AM    PHUR 6.0 06/22/2022 11:20 AM    WBCUA 0 10/21/2023 08:29 AM    RBCUA 0 10/21/2023 08:29 AM    BACTERIA None Seen 10/21/2023 08:29 AM    CLARITYU Clear 06/22/2022 11:20 AM    LEUKOCYTESUR Negative 06/22/2022 11:20 AM    UROBILINOGEN 1.0 06/22/2022 11:20 AM    BILIRUBINUR Negative 06/22/2022 11:20 AM    BLOODU Negative 06/22/2022 11:20 AM    GLUCOSEU Negative 06/22/2022 11:20 AM    KETUA TRACE 06/22/2022 11:20 AM       Radiology:  Reviewed as available.    Thank you for the new consult with [facility]). Please do not hesitate to contact us with questions. Electronically signed by Trevor Lou, MD on 03/05/2024 at 1:37 PM  Office : [phone]  Fax :[phone]

## 2024-08-28 ENCOUNTER — Ambulatory Visit
Admit: 2024-08-28 | Discharge: 2024-08-28 | Payer: PRIVATE HEALTH INSURANCE | Attending: Internal Medicine | Primary: Internal Medicine

## 2024-08-28 ENCOUNTER — Encounter: Payer: PRIVATE HEALTH INSURANCE | Attending: Internal Medicine | Primary: Internal Medicine

## 2024-08-28 LAB — POCT GLYCOSYLATED HEMOGLOBIN (HGB A1C): Hemoglobin A1C: 5.8 %

## 2024-08-28 NOTE — Assessment & Plan Note (Signed)
 Patient comes in for wellness exam   we discussed age appropriate USPSTF screens  Over 75% of the visit was spent counselling patient on appropriate lifestyle choices.

## 2024-08-28 NOTE — Assessment & Plan Note (Signed)
Stable.  Treats with lifestyle changes low-fat diet an exercise.  Will recheck fasting lipid panel.

## 2024-08-28 NOTE — Assessment & Plan Note (Signed)
 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 Donato.  - We do recommend per our experience adjuvant therapy for 2 years with palbociclib.   - He started Ibrance on 10/10/22 with Dr. Donato - did have to hold for 5 weeks after C1 at 125mg  daily for prolonged neutropenia. Resumed on 12/05/22 at 75mg  daily d1-21 every 28 days. Stopped Ibrance d/t prolonged neutropenia. Changed to abemaciclib 100mg  BID on 12/28/22. Due to prolonged neutropenia, changed to 50mg  BID and patient reports has been doing well since.   - Restaging scans today (07/11/24) personally reviewed showed no evidence of recurrent disease.      Patient will follow-up with oncologist per their orders

## 2024-08-28 NOTE — Assessment & Plan Note (Signed)
 A1C:   Lab Results   Component Value Date/Time    LABA1C 5.8 08/28/2024 02:32 PM    LABA1C 5.9 08/29/2023 09:06 AM    LABA1C 5.5 08/26/2022 08:45 AM    LABA1C 5.6 08/26/2021 11:06 AM     Stable- continue home blood sugar monitoring  Counselled on Diet and exercise with goal to achieve appropriate BMI  Patient agreed with plan with verbal understanding

## 2024-08-28 NOTE — Progress Notes (Signed)
 Well Adult Note  Name: Trevor Crosby Unijb'd Date: 08/28/2024   MRN: [id number] Sex: Male   Age: 53 y.o. Ethnicity: Non-Hispanic / Non Latino   DOB: 1970/12/04 Race: Black / African American      [employer] is here for a well adult exam.       Assessment & Plan   Encounter for well adult exam without abnormal findings  Assessment & Plan:   Patient comes in for wellness exam   we discussed age appropriate USPSTF screens  Over 75% of the visit was spent counselling patient on appropriate lifestyle choices.     Liposarcoma [facility] [place])  Assessment & Plan:   Patient s/p resection of retroperitoneal mass working diagnosis liposarcoma (from first pathology report tissue biopsy) on 08/10/22  Patient saw [facility] oncologist Dr. 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 Donato.  - We do recommend per our experience adjuvant therapy for 2 years with palbociclib.   - He started Ibrance on 10/10/22 with Dr. Donato - did have to hold for 5 weeks after C1 at 125mg  daily for prolonged neutropenia. Resumed on 12/05/22 at 75mg  daily d1-21 every 28 days. Stopped Ibrance d/t prolonged neutropenia. Changed to abemaciclib 100mg  BID on 12/28/22. Due to prolonged neutropenia, changed to 50mg  BID and patient reports has been doing well since.   - Restaging scans today (07/11/24) personally reviewed showed no evidence of recurrent disease.      Patient will follow-up with oncologist per their orders  Pure hypercholesterolemia  Assessment & Plan:   Stable.  Treats with lifestyle changes low-fat diet an exercise.  Will recheck fasting lipid panel.  Prediabetes  Assessment & Plan:   A1C:   Lab Results   Component Value Date/Time    LABA1C 5.8 08/28/2024 02:32 PM    LABA1C 5.9 08/29/2023 09:06 AM    LABA1C 5.5 08/26/2022  08:45 AM    LABA1C 5.6 08/26/2021 11:06 AM     Stable- continue home blood sugar monitoring  Counselled on Diet and exercise with goal to achieve appropriate BMI  Patient agreed with plan with verbal understanding     Orders:  -     POCT glycosylated hemoglobin (Hb A1C)  -     CBC with Auto Differential; Future  -     Comprehensive Metabolic Panel; Future  Screening cholesterol level  -     Lipid Panel; Future  Other fatigue  -     TSH reflex to FT4; Future  Vitamin D  deficiency  -     Vitamin D  25 Hydroxy; Future  Screening PSA (prostate specific antigen)  -     PSA Screening; Future        Return in about 6 months (around 02/25/2025) for Routine follow-up, 15-minutes .       Subjective   History:  Wellness exam - Patient presents today for annual physical. Patient  reports feeling well. Patient has a normal appetite. Patient  eats 5+ servings of vegetables/fruits each day. Patient prepares food at home multiple times/day, eats in restaurants rarely. Patient  states that he sleeps 5-6 hours of  on average. In regards to emotional health, patient  denies depression or anxiety. Libido is considered to  be normal. In regards to bowel habits, patient  has no complaints. Regarding urination, no complaints. Patient reports they feels safe at home, uses seat belts and has smoke detectors. Patient has a good work-life balance, and is happy with his job.     Health Maintenance    Annual retinal eye exam - Patient due  will need to schedule   Annual Dentist visit - Patient due  will need to schedule   Tobacco smoking  -  NO  Alcohol Misuse - NO  Illicit Drug Use- NO  Healthy Diet and physical activity -  YES  Obesity Screen- screened  08/25/2021  Wears seat belt- YES  End of life directives discussed with patient.-Mentions he does not have  a will/or/an advanced directives. Was instructed to start process looking into preparing his will an advanced directives.        Review of Systems   Constitutional:  Negative for activity  change, appetite change, chills, fatigue, fever and unexpected weight change.   HENT:  Negative for congestion, ear pain, postnasal drip, sinus pressure, sore throat, tinnitus and trouble swallowing.    Eyes:  Negative for pain and redness.   Respiratory:  Negative for cough, chest tightness, shortness of breath and wheezing.    Cardiovascular:  Negative for chest pain, palpitations and leg swelling.   Gastrointestinal:  Negative for abdominal distention, abdominal pain, blood in stool, constipation, diarrhea, nausea and vomiting.   Endocrine: Negative for cold intolerance, heat intolerance and polydipsia.   Genitourinary:  Negative for decreased urine volume, dysuria, flank pain, frequency, hematuria and urgency.   Musculoskeletal:  Negative for arthralgias, back pain, joint swelling, neck pain and neck stiffness.   Skin:  Negative for color change and rash.   Neurological:  Negative for dizziness, weakness, numbness and headaches.   Hematological:  Negative for adenopathy.   Psychiatric/Behavioral:  Negative for behavioral problems, sleep disturbance and suicidal ideas. The patient is not nervous/anxious.        No Known Allergies  Prior to Visit Medications   Medication Sig Taking? Authorizing Provider   VERZENIO 50 MG TABS  Yes [provider]   Probiotic Product (PROBIOTIC DAILY PO) Take by mouth Yes [provider]   Multiple Vitamin (ONE-A-DAY MENS) TABS Take 1 tablet by mouth daily Yes [provider]     No past medical history on file.  Past Surgical History:   Procedure Laterality Date    ABDOMINAL MASS RESECTION N/A 08/10/2022    COLONOSCOPY  2016 probably    Most likely -No polyps    CT BIOPSY ABDOMEN RETROPERITONEUM  06/23/2022    CT BIOPSY ABDOMEN RETROPERITONEUM 06/23/2022 [facility] CT SCAN    FACIAL COSMETIC SURGERY      fx facial bone ; fracture with basketball injury     Family History   Problem Relation Age of Onset    Colon Cancer Mother 110    High Blood Pressure Father       Social History     Tobacco Use    Smoking status: Never    Smokeless tobacco: Never   Vaping Use    Vaping status: Never Used   Substance Use Topics    Alcohol use: Yes     Alcohol/week: 2.0 standard drinks of alcohol     Types: 2 Cans of beer per week    Drug use: Never           Objective    Vital Signs  BP 120/84 (  BP Site: Left Upper Arm, Patient Position: Sitting, BP Cuff Size: Large Adult)   Pulse 63   Temp 98.3 F (36.8 C) (Oral)   Ht 1.727 m (5' 8)   Wt 77.5 kg (170 lb 12.8 oz)   SpO2 98%   BMI 25.97 kg/m     Wt Readings from Last 3 Encounters:   08/28/24 77.5 kg (170 lb 12.8 oz)   03/05/24 78.3 kg (172 lb 9.6 oz)   02/27/24 78.3 kg (172 lb 9.6 oz)       Physical Exam  Constitutional:       General: He is not in acute distress.     Appearance: Normal appearance. He is not ill-appearing.   HENT:      Head: Normocephalic and atraumatic.      Right Ear: Tympanic membrane, ear canal and external ear normal. There is no impacted cerumen.      Left Ear: Tympanic membrane, ear canal and external ear normal. There is no impacted cerumen.      Mouth/Throat:      Mouth: Mucous membranes are moist.      Pharynx: No oropharyngeal exudate or posterior oropharyngeal erythema.   Eyes:      Extraocular Movements: Extraocular movements intact.      Conjunctiva/sclera: Conjunctivae normal.      Pupils: Pupils are equal, round, and reactive to light.   Neck:      Vascular: No carotid bruit.   Cardiovascular:      Rate and Rhythm: Normal rate and regular rhythm.      Pulses: Normal pulses.      Heart sounds: Normal heart sounds. No murmur heard.     No gallop.   Pulmonary:      Effort: Pulmonary effort is normal.      Breath sounds: Normal breath sounds. No wheezing, rhonchi or rales.   Abdominal:      General: Abdomen is flat. Bowel sounds are normal. There is no distension.      Palpations: Abdomen is soft.      Tenderness: There is no abdominal tenderness. There is no guarding or rebound.   Musculoskeletal:          General: No swelling or tenderness. Normal range of motion.      Cervical back: No tenderness.   Lymphadenopathy:      Cervical: No cervical adenopathy.   Skin:     Findings: No erythema, lesion or rash.   Neurological:      General: No focal deficit present.      Mental Status: He is alert and oriented to person, place, and time. Mental status is at baseline.      Cranial Nerves: No cranial nerve deficit.      Motor: No weakness.   Psychiatric:         Mood and Affect: Mood normal.         Behavior: Behavior normal.         Thought Content: Thought content normal.         Judgment: Judgment normal.             Personalized Preventive Plan  Current Health Maintenance Status  Immunization History   Administered Date(s) Administered    TDaP, ADACEL (age 17y-64y), BOOSTRIX (age 10y+), IM, 0.5mL 03/06/2010, 08/25/2021        Health Maintenance Due   Topic Date Due    COVID-19 Vaccine (1) Never done    Hepatitis B vaccine (1 of 3 -  19+ 3-dose series) Never done    Shingles vaccine (1 of 2) Never done    Pneumococcal 50+ years Vaccine (1 of 1 - PCV) Never done      Recommendations for Preventive Services Due: see orders and patient instructions/AVS.

## 2024-08-28 NOTE — Patient Instructions (Signed)
 Well Visit, Ages 51 to 11: Care Instructions  Well visits can help you stay healthy. Your doctor has checked your overall health and may have suggested ways to take good care of yourself. Your doctor also may have recommended tests. You can help prevent illness with healthy eating, good sleep, vaccinations, regular exercise, and other steps.    Get the tests that you and your doctor decide on. Depending on your age and risks, examples might include screening for diabetes; hepatitis C; HIV; and cervical, breast, lung, and colon cancer. Screening helps find diseases before any symptoms appear.   Eat healthy foods. Choose fruits, vegetables, whole grains, lean protein, and low-fat dairy foods. Limit saturated fat and reduce salt.     Limit alcohol. Men should have no more than 2 drinks a day. Women should have no more than 1. For some people, no alcohol is the best choice.   Exercise. Get at least 30 minutes of exercise on most days of the week. Walking can be a good choice.     Reach and stay at your healthy weight. This will lower your risk for many health problems.   Take care of your mental health. Try to stay connected with friends, family, and community, and find ways to manage stress.     If you're feeling depressed or hopeless, talk to someone. A counselor can help. If you don't have a counselor, talk to your doctor.   Talk to your doctor if you think you may have a problem with alcohol or drug use. This includes prescription medicines, marijuana, and other drugs.     Avoid tobacco and nicotine: Don't smoke, vape, or chew. If you need help quitting, talk to your doctor.   Practice safer sex. Getting tested, using condoms or dental dams, and limiting sex partners can help prevent STIs.     Use birth control if it's important to you to prevent pregnancy. Talk with your doctor about your choices and what might be best for you.   Prevent problems where you can. Protect your skin from too much sun, wash your  hands, brush your teeth twice a day, and wear a seat belt in the car.   Where can you learn more?  Go to [employer] and enter P072 to learn more about Well Visit, Ages 56 to 19: Care Instructions.  Current as of: May 29, 2024  Content Version: 14.6   2024-2025 [place], [place].   Care instructions adapted under license by [facility]. If you have questions about a medical condition or this instruction, always ask your healthcare professional. Romayne Alderman, [facility], disclaims any warranty or liability for your use of this information.         Prostate Cancer Screening: Care Instructions  Overview     Prostate cancer is the abnormal growth of cells in the prostate. The prostate is part of the male reproductive system. It is a small organ below the bladder that makes fluid for semen.  Screening can help find prostate cancer early. When it's found and treated early, the cancer may be cured. But it's not always treated. That's because the treatments can cause serious side effects. In most cases, prostate cancer isn't life-threatening. This is especially true in someone who is older and when the cancer grows slowly.  Prostate cancer is a common type of cancer. Most cases occur after age 27. The disease runs in families. And it's more common in African Americans.  Follow-up care is a key part  of your treatment and safety. Be sure to make and go to all appointments, and call your doctor if you are having problems. It's also a good idea to know your test results and keep a list of the medicines you take.  What is the screening test for prostate cancer?  The main screening test for prostate cancer is the prostate-specific antigen (PSA) test. This is a blood test that measures how much PSA is in your blood. A high level may mean that you have an enlarged prostate, an infection, or cancer.  Along with the PSA test, you may have a digital (finger) rectal exam. This exam checks for anything  abnormal in your prostate. To do the exam, the doctor puts a lubricated, gloved finger into your rectum.  If these tests suggest cancer, you may need a prostate biopsy to see if there are cancer cells in the prostate.  What are the pros and cons of screening?  Neither a PSA test nor a digital rectal exam can tell you for sure that you do or do not have cancer. But they can help you decide if you need more tests, such as a prostate biopsy. Screening tests may be useful because prostate cancer often doesn't cause symptoms. It can be hard to know if you have cancer until it's more advanced. And then it's harder to treat.  But having a PSA test can also cause harm. The test may show high levels of PSA that aren't caused by cancer. So you could have a prostate biopsy you didn't need. Or the PSA test might be normal when there is cancer, so a cancer might not be found early. The test can also find cancers that would never have caused a problem during your lifetime. So you might have treatment that wasn't needed.  Prostate cancer usually develops late in life and grows slowly. In most cases, it doesn't shorten lives.  Talk with your doctor to see if screening is right for you.  Where can you learn more?  Go to [employer] and enter R550 to learn more about Prostate Cancer Screening: Care Instructions.  Current as of: September 23, 2023  Content Version: 14.6   2024-2025 [place], [place].   Care instructions adapted under license by [facility]. If you have questions about a medical condition or this instruction, always ask your healthcare professional. Romayne Alderman, [facility], disclaims any warranty or liability for your use of this information.         Learning About Living [place]  What is a living will?    A living will, also called a declaration, is a legal form. It tells your loved ones and your doctor your wishes when you can't speak for yourself. It's used by the health professionals  who will treat you as you near the end of your life or if you get seriously hurt or ill.  If you put your wishes in writing, your loved ones and others will know what kind of care you want. They won't need to guess. This can ease your mind and be helpful to others. And you can change or cancel your living will at any time.  A living will is not the same as an estate or property will. An estate will explains what you want to happen with your money and property after you die.  How do you use it?  Keep these facts in mind about how a living will is used.  Your living will is used only  if you can't speak or make decisions for yourself. Most often, one or more doctors must certify that you can't speak or decide for yourself before your living will takes effect.  If you get better and can speak for yourself again, you can accept or refuse any treatment. It doesn't matter what you said in your living will.  Some states may limit your right to refuse treatment in certain cases. For example, you may need to clearly state in your living will that you don't want artificial hydration and nutrition, such as being fed through a tube.  Is a living will a legal document?  A living will is a legal document. Each state has its own laws about living wills. And a living will may be called something else in your state.  Here are some things to know about living wills.  You don't need an attorney to complete a living will. But legal advice can be helpful if your state's laws are unclear. It can also help if your health history is complicated or your loved ones can't agree on what should be in your living will.  You can change your living will at any time. Some people find that their wishes about end-of-life care change as their health changes. If you make big changes to your living will, complete a new form.  If you move to another state, make sure that your living will is legal in the state where you now live. In most cases, doctors  will respect your wishes even if you have a form from a different state.  You might use a universal form that has been approved by many states. This kind of form can sometimes be filled out and stored online. Your digital copy will then be available wherever you have a connection to the internet. The doctors and nurses who need to treat you can find it right away.  Your state may offer an online registry. This is another place where you can store your living will online.  Some states require a living will to be witnessed or notarized. Forms can be notarized by a person called a Dance movement psychotherapist, who watches as the form is signed and witnessed.  What should you know when you create a living will?  Here are some questions to ask yourself as you make your living will.  Do you know enough about life support methods that might be used? If not, talk to your doctor so you know what might be done if you can't breathe on your own, your heart stops, or you can't swallow.  What things would you still want to be able to do after you receive life-support methods? Would you want to be able to walk? To speak? To eat on your own? To live without the help of machines?  Do you want certain religious practices performed if you become very ill?  If you have a choice, where do you want to be cared for? In your home? At a hospital or nursing home?  If you have a choice at the end of your life, where would you prefer to die? At home? In a hospital or nursing home? Somewhere else?  Would you prefer to be buried or cremated?  Do you want your organs to be donated after you die?  What should you do with your living will?  Make sure that your loved ones and your health care agent have copies of your living will (also called a declaration).  Give  your doctor a copy of your living will. Ask to have it kept as part of your medical record. If you have more than one doctor, make sure that each one has a copy.  Put a copy of your living will where it  can be easily found. For example, some people may put a copy on their refrigerator door. If you are using a digital copy, be sure your doctor, loved ones, and health care agent know how to find and access it.  Where can you learn more?  Go to [employer] and enter K356 to learn more about Learning About Living [place].  Current as of: September 22, 2023  Content Version: 14.6   2024-2025 [place], [place].   Care instructions adapted under license by [facility]. If you have questions about a medical condition or this instruction, always ask your healthcare professional. Romayne Alderman, [facility], disclaims any warranty or liability for your use of this information.         Learning About Medical Power of Attorney  What is a medical power of attorney?     A medical power of attorney, also called a durable power of attorney for health care, is one type of the legal forms called advance directives. It lets you name the person you want to make treatment decisions for you if you can't speak or decide for yourself. The person you choose is called your health care agent. This person is also called a health care proxy or health care surrogate.  A medical power of attorney may be called something else in your state.  How do you choose a health care agent?  Choose your health care agent carefully. This person may or may not be a family member.  Talk to the person before you make your final decision. Make sure this person is comfortable with this responsibility.  It's a good idea to choose someone who:  Is at least 52 years old.  Knows you well and understands what makes life meaningful for you.  Understands your religious and moral values.  Will do what you want, not what that person wants.  Will be able to make difficult choices at a stressful time.  Will be able to refuse or stop treatment, if that is what you would want, even if you could die.  Will be firm and confident with health  professionals if needed.  Will ask questions to get needed information.  Lives near you or agrees to travel to you if needed.  Your family may help you make medical decisions while you can still be part of that process. But it's important to choose one person to be your health care agent in case you aren't able to make decisions for yourself.  If you don't fill out the legal form and name a health care agent, the decisions your family can make may be limited.  A health care agent may be called something else in your state.  Who will make decisions for you if you don't have a health care agent?  If you don't have a health care agent or a living will, you may not get the care you want. Decisions may be made by family members who disagree about your medical care. Or decisions may be made by a medical professional who doesn't know you well. In some cases, a judge makes the decisions.  When you name a health care agent, it is very clear who has the power to make health decisions for  you.  How do you name a health care agent?  You name your health care agent on a legal form. This form is usually called a medical power of attorney. Ask your hospital, state bar association, or office on aging where to find these forms.  You must sign the form to make it legal. Some states require you to get the form notarized. This means that a person called a notary public watches you sign the form and then the notary signs the form. Some states also require that two or more witnesses sign the form.  Be sure to tell your family members and doctors who your health care agent is.  Where can you learn more?  Go to [employer] and enter P737 to learn more about Learning About Medical Power of [place].  Current as of: September 22, 2023  Content Version: 14.6   2024-2025 [place], [place].   Care instructions adapted under license by [facility]. If you have questions about a medical condition or this  instruction, always ask your healthcare professional. Romayne Alderman, [facility], disclaims any warranty or liability for your use of this information.         Advance Care Planning: Care Instructions  Overview    It can be hard to live with an illness that cannot be cured. But if your health is getting worse, you may want to make decisions about end-of-life care. Planning for the end of your life does not mean that you are giving up. It is a way to make sure that your wishes are met. Clearly stating your wishes can make it easier for your loved ones. Making plans while you are still able may also ease your mind and make your final days less stressful and more meaningful.  Follow-up care is a key part of your treatment and safety. Be sure to make and go to all appointments, and call your doctor if you are having problems. It's also a good idea to know your test results and keep a list of the medicines you take.  What can you do to plan for the end of life?  You can bring these issues up with your doctor. You do not need to wait until your doctor starts the conversation. You might start with, What makes life worth living for me is. . . And then follow it with, I would not be willing to live with . . . When you complete this sentence it helps your doctor understand your wishes.  Talk openly and honestly with your doctor. This is the best way to understand the decisions you will need to make as your health changes. Know that you can always change your mind.  Ask your doctor about commonly used life-support measures. These include tube feedings, breathing machines, and fluids given through a vein (I.V.). Understanding these treatments will help you decide whether you want them.  You may choose to have these life-supporting treatments for a limited time. This allows a trial period to see whether they will help you. You may also decide that you want your doctor to take only certain measures to keep you alive. It may help  to think about the big picture, like what makes life worth living for you or what your values and goals are.  Talk to your doctor about how long you are likely to live. Your doctor may be able to give you an idea of what usually happens with your specific illness.  Think about preparing papers that  state your wishes. These papers are called advance directives. If you do this early and review them often, there will not be any confusion about what you want. You can change your instructions at any time.  Which papers should you prepare?  Advance directives are legal papers that tell doctors how you want to be cared for at the end of your life. You do not need a lawyer to write these papers. Ask your doctor or your state health department for information on how to write your advance directives. They may have the forms for each of these types of papers. Make sure your doctor has a copy of these on file, and give a copy to a family member or close friend.  Consider a do-not-resuscitate order (DNR). This order asks that no extra treatments be done if your heart stops or you stop breathing. Extra treatments may include cardiopulmonary resuscitation (CPR), electrical shock to restart your heart, or a machine to breathe for you. If you decide to have a DNR order, ask your doctor to explain and write it. Place the order in your home where everyone can easily see it.  Consider a living will. A living will explains your wishes about life support and other treatments at the end of your life if you become unable to speak for yourself. Living wills tell doctors to use or not use treatments that would keep you alive. Your state may require that you have one or two witnesses or a notary present when you sign this form. A living will may be called something else in your state.  Consider a medical power of attorney. This form allows you to name a person to make decisions about your care if you are not able to. Most people ask a close  friend or family member. Talk to this person about the kinds of treatments you want and those that you do not want. Make sure this person understands your wishes. A medical power of attorney may be called something else in your state.  These legal papers are simple to change. Tell your doctor what you want to change, and ask them to make a note in your medical file. Give your family updated copies of the papers.  Where can you learn more?  Go to [employer] and enter P184 to learn more about Advance Care Planning: Care Instructions.  Current as of: November 16, 2023  Content Version: 14.6   2024-2025 [place], [place].   Care instructions adapted under license by [facility]. If you have questions about a medical condition or this instruction, always ask your healthcare professional. Romayne Alderman, [facility], disclaims any warranty or liability for your use of this information.    Alcohol Abuse and Alcoholism   (Alcohol Dependence; Alcohol Use Disorder)       Definition   Alcohol abuse is excessive or problematic alcohol consumption. It can progress to alcoholism.   Alcoholism is chronic alcohol abuse that results in a physical dependence on alcohol (withdrawal symptoms) and an inability to stop or limit drinking.   Causes   Several factors can contribute to alcohol abuse and alcoholism, including:   Genes   Brain chemicals that may be different than normal   Social pressure   Emotional stress   Pain   Depression and other mental health problems   Problem drinking behaviors learned from family or friends   Risk Factors   These factors increase your chance of developing alcoholism. Tell your doctor if you have  any of these risk factors:   Sex: male   Family members who abuse alcohol (especially men whose fathers or brothers are alcoholic)   Starting to use alcohol at an early age (younger than 62)   Using illicit drugs or non-medical use of prescription drugs   Peer pressure   Easy  access to alcoholic beverages   Psychiatric disorders, such as depression or anxiety   Smoking   Symptoms   It is common to deny an alcohol problem. Alcohol abuse can occur without physical dependence.   Alcohol abuse symptoms include:   Repeated work, school, or home problems due to drinking   Risking physical safety   Recurring trouble with the law, often including drinking and driving   Continuing to drink despite alcohol-related difficulties   Symptoms of alcoholism include:   Craving a drink   Unable to stop or limit drinking   Needing greater amounts of alcohol to feel the same effect   Giving up activities in order to drink or recover from alcohol   Drinking that continues even when it causes or worsens health problems   Wanting to stop or reduce drinking, but not being able   Withdrawal symptoms if alcohol is stopped include:   Nausea   Sweating   Shaking   Anxiety   Increased blood pressure   Seizures ( delirium tremens [DTs])   The brain, nervous system, heart, liver, stomach, gastrointestinal tract, and pancreas can all be damaged by alcoholism.     Some of the Organs Damaged in Alcohol Abuse        2011 [employer]   Diagnosis   Doctors ask a series of questions to assess possible alcohol-related problems, including:   Have you tried to reduce your drinking?   Have you felt bad about drinking?   Have you been annoyed by another person's criticism of your drinking?   Do you drink in the morning to steady your nerves or cure a hangover?   Do you have problems with a job, your family, or the law?   Do you drive under the influence of alcohol?   Blood tests may be done to:   Look at the size of your red blood cells and to check for a substance called carbohydrate-deficient transferrin   Check for alcohol-related liver disease and other health problems   Treatment   Treatment for alcohol abuse or dependence is aimed at teaching patients how to manage the disease. Most professionals believe  that this means giving up alcohol completely and permanently.   The first and most important step is recognizing a problem exists. Successful treatment depends on your desire to change. Your doctor can help you withdraw from alcohol safely. This could require hospitalization in a detoxification center. They will carefully monitor you for side effects. You may need medication while you are undergoing detoxification.   Treatments include:   Medications    Drugs can help relieve some of the symptoms of withdrawal and help prevent relapse. The doctor may prescribe medication to reduce cravings for alcohol.   Medications used to treat alcoholism and to try to prevent drinking include:   Naltrexone (ReVia, Vivitrol)blocks the high that makes you crave alcohol   Disulfiram (Antabuse)makes you very sick if you drink alcohol   Acamprosate (Campral)reduces your craving for alcohol   A study showed that an anticonvulsant drug, topiramate (Topamax), may reduce alcohol dependence.   Education and Counseling    Therapy helps you to  recognize alcohol's dangers. Education raises awareness of underlying issues and lifestyles that promote drinking. In therapy, you work to improve coping skills and learn other ways of dealing with stress or pain.   Mentoring and Community Help    Alcoholics Anonymous (AA) helps many people to stop drinking and stay sober. Members meet regularly and support each other. Your family members may also benefit from attending meetings of Al-Anon. Living with an alcoholic can be a painful, stressful situation.   Here are some general statistics on treatment outcomes of individuals one year after attempting to stop drinking:   1/3 remained abstinent   1/3 resumed drinking but at a lower level   1/3 relapsed completely   If you are diagnosed with alcohol abuse or alcoholism, follow your doctor's instructions .   Prevention   Realizing that alcohol causes problems helps some people avoid it. Suggestions to  decrease the risk of alcohol abuse and dependence include:   Socialize without alcohol.   Avoid going to bars.   Do not keep alcohol in your home.   Avoid situations and people that encourage drinking.   Make new nondrinking friends.   Do fun things that do not involve alcohol.   Avoid reaching for a drink when stressed or upset.   Limit your alcohol intake to a moderate level.   Moderate is two or fewer drinks per day for men and one or fewer for women and older adults   A 12-ounce bottle of beer, a five-ounce glass of wine, or 1.5 ounces of liquor is considered one drink   If you are a parent, having a good relationship with your children may reduce their risk of alcohol abuse.   Most professionals who treat alcohol abuse and dependence believe that complete abstinence is the only effective prevention.     Last Reviewed: September 2010 Bernardino Kern, MD, PhD, MPH   Updated: 08/18/2009          Learning About Changing a Habit by Setting Goals  How can you change a habit?     If you've decided to change a habit--whether it's quitting smoking, lowering your blood pressure, becoming more active, or doing something else to improve your health--congratulations! Making that decision is the first step toward making a change.  What happens next? Have a reason. Set goals you can reach. Prepare for slip-ups. And get support.  What's your reason?  Your reason for wanting to change a habit is really important. Maybe you want to quit smoking so that you can avoid future health problems. Or maybe you want to eat a healthier diet so you can lose weight. If you have high blood pressure, your reason may be clear: to lower your blood pressure. Maybe you smoke and want to save money on cigarettes.  You need to feel ready to make a change. If you don't feel ready now, that's okay. You can still be thinking and planning. When you truly want to make changes, you're ready for the next step.  It's not easy to change habits--but you can do  it. Taking the time to really think about what will motivate or inspire you will help you reach your goals.  How do you set goals?  Setting goals can help a lot when you're trying to make a healthy change.  Focus on small goals. This will help you reach larger goals over time. With smaller goals, you'll have success more often, which will help you stay with it. For  example, your large goal may be to lose 20 pounds. Your small goal could be to lose 5.  Write down your goals. This will help you remember, and you'll have a clearer idea of what you want to achieve. Use a journal or notebook to record your goals. Hang up your plan where you will see it often as a reminder of what you're trying to do.  Make your goals specific. Specific goals help you measure your progress. For example, setting a goal to eat one extra serving of vegetables a day is better than a general goal to eat more vegetables.  Focus on one goal at a time. By doing this, you're less likely to feel overwhelmed and then give up.  When you reach a goal, reward yourself.  Celebrate your new behavior and success for several days, and then think about setting your next goal.  How can you prepare for slip-ups?  It's perfectly normal to try to change a habit, go along fine for a while, and then have a setback. Lots of people try and try again before they reach their goals.  What are the things that might cause a setback for you? If you have tried to change a habit before, think about what helped you and what got in your way.  By thinking about these barriers now, you can plan ahead for how to deal with them if they happen.  There will be times when you slip up and don't make your goal for the week. When that happens, don't get mad at yourself. Learn from the experience. Ask yourself what got in the way of reaching your goal. Positive thinking goes a long way when you're making lifestyle changes.  How can you get support?  Get a partner. It's motivating to  know that someone is trying to make the same change that you're making, like being more active or changing your eating habits. You have someone who is counting on you to help them succeed. That person can also remind you how far you've come.  Get friends and family involved. They can exercise with you. Or they can encourage you by saying how they admire what you are doing. Family members can join you in your healthy eating efforts. Don't be afraid to tell family and friends that their encouragement makes a big difference to you.  Join a class or support group. People in these groups often have some of the same barriers you have. They can give you support when you don't feel like staying with your plan. They can boost your morale when you need a lift. You'll also find a number of online support groups.  Encourage yourself. When you feel like giving up, don't waste energy feeling bad about yourself. Remember your reason for wanting to change, think about the progress you've made, and give yourself a pep talk and a pat on the back.  Get professional help. A dietitian can help you make your diet healthier while still allowing you to eat foods that you enjoy. A trainer or physical therapist can help design an exercise program that is fun and easy to stay on. A counselor, a Child psychotherapist, or your doctor can help you overcome hurdles, reduce stress, or quit smoking.  Where can you learn more?  Go to [employer] and enter [phone] to learn more about Learning About Changing a Habit by Setting Goals.  Current as of: June 29, 2023  Content Version: 14.6   2024-2025 [place], [place].  Care instructions adapted under license by [facility]. If you have questions about a medical condition or this instruction, always ask your healthcare professional. Romayne Alderman, [facility], disclaims any warranty or liability for your use of this information.    WELL ADULT LIFESTYLE INSTRUCTIONS:    Pick a day in  the next week to spend an hour reviewing the information below then:     1) determine your health goals for the year   2) determine what changes you need to achieve those goals   3) design your daily routine, shopping habits etc to implement those changes        Default Right Action (no choices)       Make it EASY to do the RIGHT THINGS.   4) I invite you to send me your plans via mychart so I can continue to help you       with them    Examine your lifestyle with an emphasis on BARRIERS to bad and good habits and how you can design your life to make better choices.    If you want to feel better these are the FUNDAMENTAL PILLARS of Wellness:    1)  You can choose to Get 150 min/week of moderate exercise (can talk but can't        sing) or 75 min/week of vigorous exercise (can't talk).   This will enhance your sense of well being (Exercise is as good as medicine for   depression.)    2)  You can choose to Get 7-9 hours of sleep per night    Detoxifies your brain, reduces risk of dementia    3)  You can choose to Strength Train 2 x a week on non-consecutive days   This will improve function and reduce risk of injury.   Body weight type exercises   such as Yoga and Pilates are excellent choices.    4)  You can choose Good Nutrition.  Only eat your goal weight (in lbs) x 10        calories/day and get 5 servings of Vegetables/day   Plant based diets reduce risk of heart attack/stroke and will help you feel full on   less food.   Avoid highly processed foods and processed carbohydrates.    5)  You can choose Moderate alcohol intake < 1-2 drinks/day   Alcohol will disrupt your sleep and add calories to your day.    6)  You can choose to Develop a Charismatic/Supportive relationship.     This will strengthen your resilience for the ups and downs.    7)  You can choose to Practice Mindfulness.     An hour a day of prayer/meditation/gratitude will change your life!    If you are trying to lose weight, here are some  recommendations for weight loss:  Not every weight loss program is appropriate for everybody...  good online sources include Noom (more social with daily check ins), Lifesum (similar but less social) and Naturally slim, as well as Brandneu ($1500)    The GI Diet or Primal diet, Intermittent fasting can also be effective choices.    If you have diabetes treated with insulin be sure to ask for specific guidance around meals.    Take your desired weight in pounds and multiply by 10 and that is your average daily calorie allowance.  For example if you wish to weigh 170 lb x 10 = 1700 cal/day (this is how to gradually lose the  weight and maintain your desired weight).    Avoid soda/coke and all wet carbs => Drink ice water instead    Drink a large glass of ice water before meals and EAT SLOWLY (talk while you eat)!    Rethink your hunger => it means your losing weight.    Minimize highly processed carbohydrates as they stimulate your appetite:  Specifically cut back on Bread, Rice, Pasta and Potatoes    Avoid eating calories after 6 pm        DASH Diet: After Your Visit  Your Care Instructions  The DASH diet is an eating plan that can help lower your blood pressure. DASH stands for Dietary Approaches to Stop Hypertension. Hypertension is high blood pressure.  The DASH diet focuses on eating foods that are high in calcium, potassium, and magnesium. These nutrients can lower blood pressure. The foods that are highest in these nutrients are fruits, vegetables, low-fat dairy products, nuts, seeds, and legumes. But taking calcium, potassium, and magnesium supplements instead of eating foods that are high in those nutrients does not have the same effect. The DASH diet also includes whole grains, fish, and poultry.  The DASH diet is one of several lifestyle changes your doctor may recommend to lower your high blood pressure. Your doctor may also want you to decrease the amount of sodium in your diet. Lowering sodium while  following the DASH diet can lower blood pressure even further than just the DASH diet alone.  Follow-up care is a key part of your treatment and safety. Be sure to make and go to all appointments, and call your doctor if you are having problems. It's also a good idea to know your test results and keep a list of the medicines you take.  How can you care for yourself at home?  Following the DASH diet  Eat 4 to 5 servings of fruit each day. A serving is 1 medium-sized piece of fruit,  cup chopped or canned fruit, 1/4 cup dried fruit, or 4 ounces ( cup) of fruit juice. Choose fruit more often than fruit juice.  Eat 4 to 5 servings of vegetables each day. A serving is 1 cup of lettuce or raw leafy vegetables,  cup of chopped or cooked vegetables, or 4 ounces ( cup) of vegetable juice. Choose vegetables more often than vegetable juice.  Get 2 to 3 servings of low-fat and fat-free dairy each day. A serving is 8 ounces of milk, 1 cup of yogurt, or 1  ounces of cheese.  Eat 7 to 8 servings of grains each day. A serving is 1 slice of bread, 1 ounce of dry cereal, or  cup of cooked rice, pasta, or cooked cereal. Try to choose whole-grain products as much as possible.  Limit lean meat, poultry, and fish to 6 ounces each day. Six ounces is about the size of two decks of cards.  Eat 4 to 5 servings of nuts, seeds, and legumes (cooked dried beans, lentils, and split peas) each week. A serving is 1/3 cup of nuts, 2 tablespoons of seeds, or  cup cooked dried beans or peas.  Limit sweets and added sugars to 5 servings or less a week. A serving is 1 tablespoon jelly or jam,  cup sorbet, or 1 cup of lemonade.  Tips for success  Start small. Do not try to make dramatic changes to your diet all at once. You might feel that you are missing out on your favorite foods and then be  more likely to not follow the plan. Make small changes, and stick with them. Once those changes become habit, add a few more changes.  Try some of the  following:  Make it a goal to eat a fruit or vegetable at every meal and at snacks. This will make it easy to get the recommended amount of fruits and vegetables each day.  Try yogurt topped with fruit and nuts for a snack or healthy dessert.  Add lettuce, tomato, cucumber, and onion to sandwiches.  Combine a ready-made pizza crust with low-fat mozzarella cheese and lots of vegetable toppings. Try using tomatoes, squash, spinach, broccoli, carrots, cauliflower, and onions.  Have a variety of cut-up vegetables with a low-fat dip as an appetizer instead of chips and dip.  Sprinkle sunflower seeds or chopped almonds over salads. Or try adding chopped walnuts or almonds to cooked vegetables.  Try some vegetarian meals using beans and peas. Add garbanzo or kidney beans to salads. Make burritos and tacos with mashed pinto beans or black beans     2006-2012 [employer]. Care instructions adapted under license by [facility]. This care instruction is for use with your licensed healthcare professional. If you have questions about a medical condition or this instruction, always ask your healthcare professional. [employer] disclaims any warranty or liability for your use of this information.  Content Version: 9.4.94723; Last Revised: February 11, 2011                Heart-Healthy Diet   Sodium, Fat, and Cholesterol Controlled Diet       What Is a Heart Healthy Diet?   A heart-healthy diet is one that limits sodium , certain types of fat , and cholesterol . This type of diet is recommended for:   People with any form of cardiovascular disease (eg, coronary heart disease , peripheral vascular disease , previous heart attack , previous stroke )   People with risk factors for cardiovascular disease, such as high blood pressure , high cholesterol , or diabetes   Anyone who wants to lower their risk of developing cardiovascular disease   Sodium    Sodium is a mineral found in many foods. In  general, most people consume much more sodium than they need. Diets high in sodium can increase blood pressure and lead to edema (water retention). On a heart-healthy diet, you should consume no more than 2,300 mg (milligrams) of sodium per dayabout the amount in one teaspoon of table salt. The foods highest in sodium include table salt (about 50% sodium), processed foods, convenience foods, and preserved foods.   Cholesterol    Cholesterol is a fat-like, waxy substance in your blood. Our bodies make some cholesterol. It is also found in animal products, with the highest amounts in fatty meat, egg yolks, whole milk, cheese, shellfish, and organ meats. On a heart-healthy diet, you should limit your cholesterol intake to less than 200 mg per day.   It is normal and important to have some cholesterol in your bloodstream. But too much cholesterol can cause plaque to build up within your arteries, which can eventually lead to a heart attack or stroke.   The two types of cholesterol that are most commonly referred to are:   Low-density lipoprotein (LDL) cholesterol  Also known as bad cholesterol, this is the cholesterol that tends to build up along your arteries. Bad cholesterol levels are increased by eating fats that are saturated or hydrogenated. Optimal level of this cholesterol is  less than 100. Over 130 starts to get risky for heart disease.   High-density lipoprotein (HDL) cholesterol  Also known as good cholesterol, this type of cholesterol actually carries cholesterol away from your arteries and may, therefore, help lower your risk of having a heart attack. You want this level to be high (ideally greater than 60). It is a risk to have a level less than 40. You can raise this good cholesterol by eating olive oil, canola oil, avocados, or nuts. Exercise raises this level, too.   Fat    Fat is calorie dense and packs a lot of calories into a small amount of food. Even though fats should be limited due to their high  calorie content, not all fats are bad. In fact, some fats are quite healthful. Fat can be broken down into four main types.   The good-for-you fats are:   Monounsaturated fat  found in oils such as olive and canola, avocados, and nuts and natural nut butters; can decrease cholesterol levels, while keeping levels of HDL cholesterol high   Polyunsaturated fat  found in oils such as safflower, sunflower, soybean, corn, and sesame; can decrease total cholesterol and LDL cholesterol   Omega-3 fatty acids  particularly those found in fatty fish (such as salmon, trout, tuna, mackerel, herring, and sardines); can decrease risk of arrhythmias, decrease triglyceride levels, and slightly lower blood pressure   The fats that you want to limit are:   Saturated fat  found in animal products, many fast foods, and a few vegetables; increases total blood cholesterol, including LDL levels   Animal fats that are saturated include: butter, lard, whole-milk dairy products, meat fat, and poultry skin   Vegetable fats that are saturated include: hydrogenated shortening, palm oil, coconut oil, cocoa butter   Hydrogenated or trans fat  found in margarine and vegetable shortening, most shelf stable snack foods, and fried foods; increases LDL and decreases HDL     It is generally recommended that you limit your total fat for the day to less than 30% of your total calories. If you follow an 1800-calorie heart healthy diet, for example, this would mean 60 grams of fat or less per day.   Saturated fat and trans fat in your diet raises your blood cholesterol the most, much more than dietary cholesterol does. For this reason, on a heart-healthy diet, less than 7% of your calories should come from saturated fat and ideally 0% from trans fat. On an 1800-calorie diet, this translates into less than 14 grams of saturated fat per day, leaving 46 grams of fat to come from mono- and polyunsaturated fats.   Food Choices on a Heart Healthy Diet   Food  Category   Foods Recommended   Foods to Avoid   Grains   Breads and rolls without salted tops Most dry and cooked cereals Unsalted crackers and breadsticks Low-sodium or homemade breadcrumbs or stuffing All rice and pastas   Breads, rolls, and crackers with salted tops High-fat baked goods (eg, muffins, donuts, pastries) Quick breads, self-rising flour, and biscuit mixes Regular bread crumbs Instant hot cereals Commercially prepared rice, pasta, or stuffing mixes   Vegetables   Most fresh, frozen, and low-sodium canned vegetables Low-sodium and salt-free vegetable juices Canned vegetables if unsalted or rinsed   Regular canned vegetables and juices, including sauerkraut and pickled vegetables Frozen vegetables with sauces Commercially prepared potato and vegetable mixes   Fruits   Most fresh, frozen, and canned fruits All fruit juices  Fruits processed with salt or sodium   Milk   Nonfat or low-fat (1%) milk Nonfat or low-fat yogurt Cottage cheese, low-fat ricotta, cheeses labeled as low-fat and low-sodium   Whole milk Reduced-fat (2%) milk Malted and chocolate milk Full fat yogurt Most cheeses (unless low-fat and low salt) Buttermilk (no more than 1 cup per week)   Meats and Beans   Lean cuts of fresh or frozen beef, veal, lamb, or pork (look for the word loin) Fresh or frozen poultry without the skin Fresh or frozen fish and some shellfish Egg whites and egg substitutes (Limit whole eggs to three per week) Tofu Nuts or seeds (unsalted, dry-roasted), low-sodium peanut butter Dried peas, beans, and lentils   Any smoked, cured, salted, or canned meat, fish, or poultry (including bacon, chipped beef, cold cuts, hot dogs, sausages, sardines, and anchovies) Poultry skins Breaded and/or fried fish or meats Canned peas, beans, and lentils Salted nuts   Fats and Oils   Olive oil and canola oil Low-sodium, low-fat salad dressings and mayonnaise   Butter, margarine, coconut and palm oils, bacon fat   Snacks, Sweets, and  Condiments   Low-sodium or unsalted versions of broths, soups, soy sauce, and condiments Pepper, herbs, and spices; vinegar, lemon, or lime juice Low-fat frozen desserts (yogurt, sherbet, fruit bars) Sugar, cocoa powder, honey, syrup, jam, and preserves Low-fat, trans-fat free cookies, cakes, and pies Graham and animal crackers, fig bars, ginger snaps   High-fat desserts Broth, soups, gravies, and sauces, made from instant mixes or other high-sodium ingredients Salted snack foods Canned olives Meat tenderizers, seasoning salt, and most flavored vinegars   Beverages   Low-sodium carbonated beverages Tea and coffee in moderation Soy milk   Commercially softened water   Suggestions   Make whole grains, fruits, and vegetables the base of your diet.    Choose heart-healthy fats such as canola, olive, and flaxseed oil, and foods high in heart-healthy fats, such as nuts, seeds, soybeans, tofu, and fish.    Eat fish at least twice per week; the fish highest in omega-3 fatty acids and lowest in mercury include salmon, herring, mackerel, sardines, and canned chunk light tuna. If you eat fish less than twice per week or have high triglycerides, talk to your doctor about taking fish oil supplements.    Read food labels.   For products low in fat and cholesterol, look for fat free, low-fat, cholesterol free, saturated fat free, and trans fat freeAlso scan the Nutrition Facts Label, which lists saturated fat, trans fat, and cholesterol amounts.   For products low in sodium, look for sodium free, very low sodium, low sodium, no added salt, and unsalted   Skip the salt when cooking or at the table; if food needs more flavor, get creative and try out different herbs and spices. Garlic and onion also add substantial flavor to foods.    Trim any visible fat off meat and poultry before cooking, and drain the fat off after browning.    Use cooking methods that require little or no added fat, such as grilling, boiling, baking, poaching,  broiling, roasting, steaming, stir-frying, and sauting.    Avoid fast food and convenience food. They tend to be high in saturated and trans fat and have a lot of added salt.    Talk to a registered dietitian for individualized diet advice.      Last Reviewed: March 2011 Hadassah Clause, MS, MPH, RD   Updated: 02/24/2010     High-Fiber Diet  What Is Fiber?   Dietary fiber is a form of carbohydrate found in plants that cannot be digested by humans. All plants contain fiber, including fruits, vegetables, grains, and legumes. Fiber is often classified into two categories: soluble and insoluble.   Soluble fiber draws water into the bowel and can help slow digestion. Examples of foods that are high in soluble fiber include oatmeal, oat bran, barley, legumes (eg, beans and peas), apples, and strawberries.   Insoluble fiber speeds digestion and can add bulk to the stool. Examples of foods that are high in insoluble fiber include whole-wheat products, wheat bran, cauliflower, green beans, and potatoes.   Why Follow a High-Fiber Diet?   A high-fiber diet is often recommended to prevent and treat constipation , hemorrhoids , diverticulitis , and irritable bowel syndrome . Eating a high-fiber diet can also help improve your cholesterol levels, lower your risk of coronary heart disease , reduce your risk of type 2 diabetes , and lower your weight. For people with type 1 or 2 diabetes, a high-fiber diet can also help stabilize blood sugar levels.   How Much Fiber Should I Eat?   A high-fiber diet should contain  20-35 grams  of fiber a day. This is actually the amount recommended for the general adult population; however, most Americans eat only 15 grams of fiber per day.   Digestion of Fiber   Eating a higher fiber diet than usual can take some getting used to by your body's digestive system. To avoid the side effects of sudden increases in dietary fiber (eg, gas, cramping, bloating, and diarrhea), increase fiber gradually and be  sure to drink plenty of fluids every day.   Tips for Increasing Fiber Intake   Whenever possible, choose whole grains over refined grains (eg, brown rice instead of white rice, whole-wheat bread instead of white bread).    Include a variety of grains in your diet, such as wheat, rye, barley, oats, quinoa, and bulgur.    Eat more vegetarian-based meals. Here are some ideas: black bean burgers, eggplant lasagna, and veggie tofu stir-fry.    Choose high-fiber snacks, such as fruits, popcorn, whole-grain crackers, and nuts.    Make whole-grain cereal or whole-grain toast part of your daily breakfast regime.    When eating out, whether ordering a sandwich or dinner, ask for extra vegetables.    When baking, replace part of the white flour with whole-wheat flour. Whole-wheat flour is particularly easy to incorporate into a recipe.    High-Fiber Diet Eating Guide   Food Category   Foods Recommended   Notes   Grains   Whole-grain breads, muffins, bagels, or pita bread Rye bread Whole-wheat crackers or crisp breads Whole-grain or bran cereals Oatmeal, oat bran, or grits Wheat germ Whole-wheat pasta and brown rice   Read the ingredients list on food labels. Look for products that list whole as the first ingredient (eg, whole-wheat, whole oats). Choose cereals with at least 2 grams of fiber per serving.   Vegetables   All vegetables, especially asparagus, bean sprouts, broccoli, Brussels sprouts, cabbage, carrots, cauliflower, celery, corn, greens, green beans, green pepper, onions, peas, potatoes (with skin), snow peas, spinach, squash, sweet potatoes, tomatoes, zucchini   For maximum fiber intake, eat the peels of fruits and vegetablesjust be sure to wash them well first.   Fruits   All fruits, especially apples, berries, grapefruits, mangoes, nectarines, oranges, peaches, pears, dried fruits (figs, dates, prunes, raisins)   Choose raw fruits and  vegetables over juice, cooked, or cannedraw fruit has more fiber. Dried fruit  is also a good source of fiber.   Milk   With the exception of yogurt containing inulin (a type of fiber), dairy foods provide little fiber.   Add more fiber by topping your yogurt or cottage cheese with fresh fruit, whole grain or bran cereals, nuts, or seeds.   Meats and Beans   All beans and peas, especially Garbanzo beans, kidney beans, lentils, lima beans, split peas, and pinto beans All nuts and seeds, especially almonds, peanuts, [place] nuts, cashews, peanut butter, walnuts, sesame and sunflower seeds All meat, poultry, fish, and eggs   Increase fiber in meat dishes by adding pinto beans, kidney beans, black-eyed peas, bran, or oatmeal. If you are following a low-fat diet, use nuts and seeds only in moderation.   Fats and Oils   All in moderation   Fats and oils do not provide fiber   Snacks, Sweets, and Condiments   Fruit Nuts Popcorn, whole-wheat pretzels, or trail mix made with dried fruits, nuts, and seeds Cakes, breads, and cookies made with oatmeal or whole-wheat flour   Most snack foods do not provide much fiber. Choose snacks with at least 2 grams of fiber per serving.     Last Reviewed: March 2011 Hadassah Clause, MS, MPH, RD   Updated: 02/24/2010     Keep Your Memory [place]       Many factors can affect your ability to remembera hectic lifestyle, aging, stress, chronic disease, and certain medicines. But, there are steps you can take to sharpen your mind and help preserve your memory.   Challenge Your Brain   Regularly challenging your mind may help keeps it in top shape. Good mental exercises include:   Crossword puzzlesUse a dictionary if you need it; you will learn more that way.   Brainteasers Try some!   Crafts, such as wood working and Microbiologist, such as gardening and Teacher, English as a foreign language old friends or join groups to meet new ones.   Reading   Learning a new language   Taking a class, whether it be art history or tai chi   TravelingExperience the food, history, and  culture of your destination   Learning to use a computer   Going to museums, the theater, or thought-provoking movies   Changing things in your daily life, such as reversing your pattern in the grocery store or brushing your teeth using your nondominant hand   Use Memory Aids   There is no need to remember every detail on your own. These memory aids can help:   Calendars and day planners   Electronic organizers to store all sorts of helpful informationThese devices can beep to remind you of appointments.   A book of days to record birthdays, anniversaries, and other occasions that occur on the same date every year   Detailed to-do lists and strategically placed sticky notes   Quick study sessionsBefore a gathering, review who will be there so their names will be fresh in your mind.   Establish routinesFor example, keep your keys, wallet, and umbrella in the same place all the time or take medicine with your 8:00 AM glass of juice   Live a Healthy Life   Many actions that will keep your body strong will do the same for your mind. For example:   Talk to Your Doctor About Herbs and Supplements    Malnutrition and vitamin deficiencies  can impair your mental function. For example, vitamin B12 deficiency can cause a range of symptoms, including confusion. But, what if your nutritional needs are being met? Can herbs and supplements still offer a benefit? Researchers have investigated a range of natural remedies, such as ginkgo , ginseng , and the supplement phosphatidylserine (PS). So far, though, the evidence is inconsistent as to whether these products can improve memory or thinking.   If you are interested in taking herbs and supplements, talk to your doctor first because they may interact with other medicines that you are taking.   Exercise Regularly    Among the many benefits of regular exercise are increased blood flow to the brain and decreased risk of certain diseases that can interfere with memory function.  One study found that even moderate exercise has a beneficial effect. Examples of moderate exercise include:   Playing 18 holes of golf once a week, without a cart   Playing tennis twice a week   Walking one mile per day   Manage Stress    It can be tough to remember what is important when your mind is cluttered. Make time for relaxation. Choose activities that calm you down, and make it routine.   Manage Chronic Conditions    Side effects of high blood pressure , diabetes, and heart disease can interfere with mental function. Many of the lifestyle steps discussed here can help manage these conditions. Strive to eat a healthy diet, exercise regularly, get stress under control, and follow your doctor's advice for your condition.   Minimize Medications    Talk to your doctor about the medicines that you take. Some may be unnecessary. Also, healthy lifestyle habits may lower the need for certain drugs.     Last Reviewed: April 2010 Redell Counts, MD   Updated: 03/11/2009          A Healthy Lifestyle: Care Instructions  A healthy lifestyle can help you feel good, have more energy, and stay at a weight that's healthy for you. You can share a healthy lifestyle with your friends and family. And you can do it on your own.    Eat meals with your friends or family. You could try cooking together.   Plan activities with other people. Go for a walk with a friend, try a free online fitness class, or join a sports league.     Eat a variety of healthy foods. These include fruits, vegetables, whole grains, low-fat dairy, and lean protein.   Choose healthy portions of food. You can use the Nutrition Facts label on food packages as a guide.     Eat more fruits and vegetables. You could add vegetables to sandwiches or add fruit to cereal.   Drink water when you are thirsty. Limit soda, juice, and sports drinks.     Try to exercise most days. Aim for at least 2 hours of exercise each week.   Keep moving. Work in the garden or take  your dog on a walk. Use the stairs instead of the elevator.     If you use tobacco or nicotine, try to quit. Ask your doctor about programs and medicines to help you quit.   Limit alcohol. Men should have no more than 2 drinks a day. Women should have no more than 1. For some people, no alcohol is the best choice.   Follow-up care is a key part of your treatment and safety. Be sure to make and go to all appointments,  and call your doctor if you are having problems. It's also a good idea to know your test results and keep a list of the medicines you take.  Where can you learn more?  Go to [employer] and enter U807 to learn more about A Healthy Lifestyle: Care Instructions.  Current as of: May 29, 2024  Content Version: 14.6   2024-2025 [place], [place].   Care instructions adapted under license by [facility]. If you have questions about a medical condition or this instruction, always ask your healthcare professional. Romayne Alderman, [facility], disclaims any warranty or liability for your use of this information.      Safer Sex: After Your Visit  Your Care Instructions  Safer sex is a way to reduce your risk of getting an infection spread through sex. It can also help prevent pregnancy. Most infections that are spread through sex, also called sexually transmitted infections or STIs, can be cured. But some can decrease your chances of getting pregnant if they are not treated early. Others, such as herpes, have no cure. And some, such as HIV, can be deadly.  Several products can help you practice safer sex and reduce your chance of STIs. One of the best is a condom. There are condoms for men and for women. The male condom is a tube of soft plastic with a closed end that is placed deep into the vagina. You can use a special rubber sheet (dental dam) for protection during oral sex. Latex gloves can keep your hands from touching blood, semen, or other body fluids that can carry  infections.  Remember that birth control methods such as diaphragms, IUDs, foams, and birth control pills do not stop you from getting STIs.  Follow-up care is a key part of your treatment and safety. Be sure to make and go to all appointments, and call your doctor if you are having problems. It's also a good idea to know your test results and keep a list of the medicines you take.  How can you care for yourself at home?  Think about getting shots to prevent hepatitis A and hepatitis B. These two diseases can be spread through sex. You also can get hepatitis A if you eat infected food.  Use condoms or male condoms each time and every time you have sex.  Learn the right way to use a male condom:  Condoms come in several sizes. Make sure you use the right size. A condom that is too small can break easily. A condom that is too big can slip off during sex. Use a new condom each time you have sex.  Be careful not to poke a hole in the condom when you open the wrapper.  Squeeze the tip of the condom to keep out air.  Pull down the loose skin (foreskin) from the head of an uncircumcised penis.  While squeezing the tip of the condom, unroll it all the way down to the base of the firm penis.  Never use petroleum jelly (such as Vaseline), grease, hand lotion, baby oil, or anything with oil in it. These products can make holes in the condom.  After sex, hold the condom on your penis as you remove your penis from your partner. This will keep semen from spilling out of the condom.  Learn to use a male condom:  You can put in a male condom up to 8 hours before sex.  Squeeze the smaller ring at the closed end and insert it  deep into the vagina. The larger ring at the open end should stay outside the vagina.  During sex, make sure the penis goes into the condom.  After the penis is removed, close the open end of the condom by twisting it. Remove the condom.  Do not use a male condom and male condom at the same time.  Do not  have sex with anyone who has symptoms of an STI, such as sores on the genitals or mouth. The herpes virus that causes cold sores can spread to and from the penis and vagina.  Do not drink a lot of alcohol or use drugs before sex. This can cause you to let down your guard and not practice safer sex.  Having one sex partner (who does not have STIs and does not have sex with anyone else) is a sure way to avoid STIs.  Talk to your partner before you have sex. Find out if he or she has or is at risk for any STI. Keep in mind that a person may be able to spread an STI even if he or she does not have symptoms. You and your partner may want to get an HIV test. You should get tested again 6 months later.     2006-2012 [employer]. Care instructions adapted under license by [facility]. This care instruction is for use with your licensed healthcare professional. If you have questions about a medical condition or this instruction, always ask your healthcare professional. [employer] disclaims any warranty or liability for your use of this information.  Content Version: 9.4.94723; Last Revised: December 17, 2010

## 2024-08-29 ENCOUNTER — Encounter

## 2024-08-30 LAB — LIPID PANEL
Cholesterol, Total: 206 mg/dL — ABNORMAL HIGH (ref 0–199)
HDL: 87 mg/dL — ABNORMAL HIGH (ref 40–60)
LDL Cholesterol: 107 mg/dL — ABNORMAL HIGH (ref <100)
Triglycerides: 58 mg/dL (ref 0–150)
VLDL Cholesterol Calculated: 12 mg/dL

## 2024-08-30 LAB — CBC WITH AUTO DIFFERENTIAL
Atypical Lymphocytes Relative: 2 % (ref 0–6)
Basophils %: 0 %
Basophils Absolute: 0 K/uL (ref 0.0–0.2)
Eosinophils %: 7 %
Eosinophils Absolute: 0.1 K/uL (ref 0.0–0.6)
Hematocrit: 40.3 % — ABNORMAL LOW (ref 40.5–52.5)
Hemoglobin: 13.5 g/dL (ref 13.5–17.5)
Lymphocytes %: 56 %
Lymphocytes Absolute: 1 K/uL (ref 1.0–5.1)
MCH: 28.6 pg (ref 26.0–34.0)
MCHC: 33.4 g/dL (ref 31.0–36.0)
MCV: 85.5 fL (ref 80.0–100.0)
MPV: 9.7 fL (ref 5.0–10.5)
Monocytes %: 9 %
Monocytes Absolute: 0.2 K/uL (ref 0.0–1.3)
Neutrophils %: 26 %
Neutrophils Absolute: 0.5 K/uL — CL (ref 1.7–7.7)
Platelets: 168 K/uL (ref 135–450)
RBC Morphology: NORMAL
RBC: 4.71 M/uL (ref 4.20–5.90)
RDW: 13.4 % (ref 12.4–15.4)
WBC: 1.8 K/uL — CL (ref 4.0–11.0)

## 2024-08-30 LAB — RENAL FUNCTION PANEL
Albumin: 4.4 g/dL (ref 3.4–5.0)
Anion Gap: 12 (ref 3–16)
BUN: 16 mg/dL (ref 7–20)
CO2: 23 mmol/L (ref 21–32)
Calcium: 9.4 mg/dL (ref 8.3–10.6)
Chloride: 104 mmol/L (ref 99–110)
Creatinine: 1.7 mg/dL — ABNORMAL HIGH (ref 0.9–1.3)
Est, Glom Filt Rate: 48 — AB
Glucose: 96 mg/dL (ref 70–99)
Phosphorus: 2.6 mg/dL (ref 2.5–4.9)
Potassium: 4.8 mmol/L (ref 3.5–5.1)
Sodium: 139 mmol/L (ref 136–145)

## 2024-08-30 LAB — COMPREHENSIVE METABOLIC PANEL
ALT: 25 U/L (ref 10–40)
AST: 31 U/L (ref 15–37)
Albumin/Globulin Ratio: 1.6 (ref 1.1–2.2)
Albumin: 4.4 g/dL (ref 3.4–5.0)
Alkaline Phosphatase: 63 U/L (ref 40–129)
Anion Gap: 11 (ref 3–16)
BUN: 17 mg/dL (ref 7–20)
CO2: 25 mmol/L (ref 21–32)
Calcium: 9.4 mg/dL (ref 8.3–10.6)
Chloride: 104 mmol/L (ref 99–110)
Creatinine: 1.6 mg/dL — ABNORMAL HIGH (ref 0.9–1.3)
Est, Glom Filt Rate: 51 — AB
Glucose: 96 mg/dL (ref 70–99)
Potassium: 4.7 mmol/L (ref 3.5–5.1)
Sodium: 140 mmol/L (ref 136–145)
Total Bilirubin: 0.7 mg/dL (ref 0.0–1.0)
Total Protein: 7.2 g/dL (ref 6.4–8.2)

## 2024-08-30 LAB — ALBUMIN/CREATININE RATIO, URINE
Albumin Urine: 2.57 mg/dL — ABNORMAL HIGH (ref <2.0)
Albumin/Creatinine Ratio: 10.4 mg/g (ref 0.0–30.0)
Creatinine, Ur: 247 mg/dL (ref 39.0–259.0)

## 2024-08-30 LAB — PSA SCREENING: PSA: 1.95 ng/mL (ref 0.00–4.00)

## 2024-08-30 LAB — TSH REFLEX TO FT4: TSH Reflex FT4: 2.28 u[IU]/mL (ref 0.27–4.20)

## 2024-08-30 LAB — VITAMIN D 25 HYDROXY: Vit D, 25-Hydroxy: 61.7 ng/mL (ref >=30)

## 2024-08-30 LAB — BLOOD SMEAR REVIEW

## 2024-08-30 NOTE — Other (Signed)
 Please fax results to     ATTENTION    Dr.  Selene, Alm LABOR, MD     2. Patric Rosaline LABOR, PA-C     [address]   [place], [place] [address]   Phone: tel:[phone]   fax:[phone]     Please please a follow-up telephone call to let them know about the patient's white blood cell counts and absolute neutrophil count and also confirm that they have received the fax.

## 2024-08-30 NOTE — Telephone Encounter (Signed)
-----   Message from Dr. Cara Finder, MD sent at 08/30/2024 11:51 AM EDT -----  Please fax results to     ATTENTION    Dr.  Selene Alm LABOR, MD     2. Patric Rosaline LABOR, PA-C     [address]   [place], [place] [address]   Phone: tel:[phone]   [phone]     Please please a follow-up telephone call to let them know about the patient's white blood cell counts and absolute neutrophil count and also confirm that they have received the fax.  ----- Message -----  From: Rubye Incoming Lab Results From Soft (Epic Adt)  Sent: 08/30/2024   2:20 AM EDT  To: Cara MALVA Finder, MD

## 2024-08-30 NOTE — Telephone Encounter (Signed)
 After-hours call communication:    Patient concern: received page from lab this morning 0615 for critical labs  WBC: 1.8  Neutrophils absolute: 0.5      Advice given: will defer to PCP for management

## 2024-08-30 NOTE — Progress Notes (Unsigned)
 The 10-year ASCVD risk score (Arnett DK, et al., 2019) is: 4.5%    Values used to calculate the score:      Age: 53 years      Clinically relevant sex: Male      Is Non-Hispanic African American: Yes      Diabetic: No      Tobacco smoker: No      Systolic Blood Pressure: 120 mmHg      Is BP treated: No      HDL Cholesterol: 87 mg/dL      Total Cholesterol: 206 mg/dL

## 2024-08-30 NOTE — Telephone Encounter (Signed)
 Called Dr. Selene office spoke with Roselind regarding pt WBC results. Confirmed with [facility] office fax number [phone]. Faxed over pt recent lab results. Confirmation of fax complete.

## 2024-08-30 NOTE — Other (Signed)
 Please let patient know that his WBC count was 1.8 with absolute neutrophil count of 0.5.    I am happy that clinically patient was doing good no fever no signs of infection, but as a precaution we will have to reach out to his heme-onc to inform them of his results.    Please find out whether patient still sees heme-onc here and [place] or whether all of his care is now at [facility].  If he is we will fax over the lab report to [facility] and reach out to their office.  If he is still sees a local heme-onc in [place] please let us  know we will also reach out to them.    His kidney function remains stable.    His bad cholesterol has gone to 107 from 113, and should be under 100.  The standard cardiovascular risk score is 4.5%.  His HDL 87 from 78, it should be under 60.  Patient can treat this with a low-fat diet and exercise.

## 2024-08-31 LAB — CYSTATIN C
Creatinine: 1.64 mg/dL — ABNORMAL HIGH (ref 0.69–1.22)
Cystatin C: 0.95 mg/L (ref 0.61–0.95)
eGFR By Cystatin C: 69 (ref >=60)

## 2024-09-04 NOTE — Telephone Encounter (Signed)
"  Trevor Crosby from Dr. Rozelle office calling to say, Dr. Rozelle office is not currently prescribing the oral chemotherapy medication for the patient. The patient has another oncologist Dr. Donato who is prescribing an oral chemotherapy and that is who would need to do any adjustments to the patients medication. Dr. Donato is not with [facility].       "

## 2024-09-04 NOTE — Result Encounter Note (Signed)
"  Can someone call patient to confirm that patient spoke with Dr. Rozelle office regarding his WBC results?  "

## 2024-09-04 NOTE — Result Encounter Note (Signed)
"  Please print out and fax CBC results to Dr. Wadie office, please place a follow-up call to Dr. Wadie office confirm that they have received the results.  "

## 2024-09-05 NOTE — Telephone Encounter (Signed)
"  Dave from Dr. Wadie office calling to say they did get the WBC information and have already left a VM for the patient and they will try again to reach the patient.     Thank you.   "

## 2024-09-17 ENCOUNTER — Ambulatory Visit: Admit: 2024-09-17 | Discharge: 2024-09-17 | Payer: PRIVATE HEALTH INSURANCE | Primary: Internal Medicine

## 2024-09-17 VITALS — BP 144/84 | HR 70 | Wt 174.2 lb

## 2024-09-17 DIAGNOSIS — N183 Chronic kidney disease, stage 3 unspecified (HCC): Principal | ICD-10-CM

## 2024-09-17 NOTE — Progress Notes (Signed)
 "                                  Roderick Schneider MD   Feliz Beckwith MD FASN FNKF  Omega Salvia MD FACP   Rolene Stank MD     Nephrology Consult Note    ASSESSMENT & PLAN    DIAGNOSIS    ICD-10-CM    1. Stage 3 chronic kidney disease, unspecified whether stage 3a or 3b CKD (HCC)  N18.30       2. H/O right nephrectomy  Z90.5       3. Solitary kidney, acquired  Z90.5       4. 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 Vitamin D  deficiency  E55.9           SUMMARY:   Trevor Crosby is a 53 y.o. 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   He is on Verzenio which is cyclin-dependent kinases (CDK) 4 and 6 inhibitor, which gets activated by binding to D-cyclins. The drug inhibits the growth of cancer cells, and disrupts the cell cycle. 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      Reason for Consult:  ckd    Chief Complaint:  ckd  History Obtained From:  Patient     History of Present Illness:    This is a 53 y.o. male who presents to the office for evaluation of  ckd.    Pt denies any hx of heavy or prolonged NSAID use.   There is no hx of jaundice or hepatitis or sexually transmitted disease.   Pt has no hx of collagen vascular  disease or vasculitis.  No history of Lower urinary tract symptoms  Pt denies any hx of recurrent UTI  Denies nephrolithiasis.  No tea coloured urine.    Past Medical History:    No past medical history on file.    Past Surgical History:        Procedure Laterality Date    ABDOMINAL MASS RESECTION N/A 08/10/2022    COLONOSCOPY  2016 probably    Most likely -No polyps    CT BIOPSY ABDOMEN RETROPERITONEUM  06/23/2022    CT BIOPSY ABDOMEN RETROPERITONEUM 06/23/2022 [facility] CT SCAN    FACIAL COSMETIC SURGERY      fx facial bone ; fracture with basketball injury       Current Medications:    Current Outpatient Medications   Medication Sig Dispense Refill    VERZENIO 50 MG TABS  Probiotic Product (PROBIOTIC DAILY PO) Take by mouth      Multiple Vitamin (ONE-A-DAY MENS) TABS Take 1 tablet by mouth daily       No current facility-administered medications for this visit.       Allergies:  Patient has no known allergies.    Social History:   Social History     Socioeconomic History    Marital status: Divorced     Spouse name: Not on file    Number of children: Not on file    Years of education: Not on file    Highest education level: Not on file   Occupational History    Not on file   Tobacco Use    Smoking status: Never    Smokeless tobacco: Never   Vaping Use    Vaping status: Never Used   Substance and Sexual Activity    Alcohol use: Yes     Alcohol/week: 2.0 standard drinks of alcohol     Types: 2 Cans of beer per week    Drug use: Never    Sexual activity: Yes     Partners: Female   Other Topics Concern    Not on file   Social History Narrative    Not on file     Social Drivers of Health     Financial Resource Strain: Low Risk  (08/29/2023)    Overall Financial Resource Strain (CARDIA)     Difficulty of Paying Living Expenses: Not hard at all   Food Insecurity: No Food Insecurity (02/27/2024)    Hunger Vital Sign     Worried About Running Out of Food in the Last Year: Never true     Ran Out of Food in the Last Year: Never  true   Transportation Needs: No Transportation Needs (02/27/2024)    PRAPARE - Therapist, Art (Medical): No     Lack of Transportation (Non-Medical): No   Physical Activity: Unknown (01/01/2019)    Exercise Vital Sign     Days of Exercise per Week: 2 days     Minutes of Exercise per Session: Not on file   Stress: Not on file   Social Connections: Not on file   Intimate Partner Violence: Not on file   Housing Stability: Low Risk  (02/27/2024)    Housing Stability Vital Sign     Unable to Pay for Housing in the Last Year: No     Number of Times Moved in the Last Year: 0     Homeless in the Last Year: No       Family History:   Family History   Problem Relation Age of Onset    Colon Cancer Mother 67    High Blood Pressure Father        Review of Systems:    Constitutional: No fever, no chills, no lethargy, no weakness.  HEENT:  No headache, otalgia, itchy eyes, nasal discharge or sore throat.  Cardiac:  No chest pain, dyspnea, orthopnea or PND.  Chest:  No cough, phlegm or wheezing.  Abdomen:  No abdominal pain, nausea or vomiting.  Neuro:  No focal weakness, abnormal movements orseizure like activity.  Skin:   No rashes, no itching.  GU:   No hematuria, no pyuria, no dysuria, no flank pain.  Extremities:  No swelling or joint pains.      Objective:  BP (!) 144/84   Pulse 70   Wt 79 kg (174 lb 3.2 oz)  BMI 26.49 kg/m     Physical Exam:  General appearance:Awake, alert, in no acute distress  Skin: warm and dry, no rash or erythema  Eyes: conjunctivae normal and sclera anicteric  ENT: :no thrush no pharyngeal congestion    Neck: without any JVD. No carotid bruits or thyromegaly. No  Lymphadenopathty:  Pulmonary: lungs are clear and without any wheezing or rhonchi   Cardiovascular:Normal S1 & S2, No S3 or  S4, No  Pericardial rub , No Murmur Abdomen: soft nontender, bowel sounds present, no organomegaly,  No ascites  Extremities: no cyanosis, clubbing or edema    Labs:    CBC:   Lab Results    Component Value Date/Time    WBC 1.8 08/29/2024 09:26 AM    RBC 4.71 08/29/2024 09:26 AM    HGB 13.5 08/29/2024 09:26 AM    HCT 40.3 08/29/2024 09:26 AM    MCV 85.5 08/29/2024 09:26 AM    RDW 13.4 08/29/2024 09:26 AM    PLT 168 08/29/2024 09:26 AM    MPV 9.7 08/29/2024 09:26 AM      BMP:   Lab Results   Component Value Date/Time    NA 140 08/29/2024 09:26 AM    NA 139 08/29/2024 09:26 AM    NA 141 02/28/2024 05:48 PM    K 4.7 08/29/2024 09:26 AM    K 4.8 08/29/2024 09:26 AM    K 4.2 02/28/2024 05:48 PM    K 3.9 06/23/2022 07:03 AM    K 4.8 06/22/2022 10:52 AM    CL 104 08/29/2024 09:26 AM    CL 104 08/29/2024 09:26 AM    CL 107 02/28/2024 05:48 PM    CO2 25 08/29/2024 09:26 AM    CO2 23 08/29/2024 09:26 AM    CO2 25 02/28/2024 05:48 PM    BUN 17 08/29/2024 09:26 AM    BUN 16 08/29/2024 09:26 AM    BUN 15 02/28/2024 05:48 PM    CREATININE 1.6 08/29/2024 09:26 AM    CREATININE 1.64 08/29/2024 09:26 AM    CREATININE 1.7 08/29/2024 09:26 AM    GLUCOSE 96 08/29/2024 09:26 AM    GLUCOSE 96 08/29/2024 09:26 AM    GLUCOSE 114 02/28/2024 05:48 PM    CALCIUM 9.4 08/29/2024 09:26 AM    CALCIUM 9.4 08/29/2024 09:26 AM    CALCIUM 9.2 02/28/2024 05:48 PM      BNP:No results found for: BNP  PHOSPHORUS:    Lab Results   Component Value Date/Time    PHOS 2.6 08/29/2024 09:26 AM    PHOS 2.9 02/28/2024 05:48 PM    PHOS 3.1 02/27/2024 09:17 AM     MAGNESIUM: No results found for: MG  ALBUMIN: No results found for: LABALBU  IRON:    Lab Results   Component Value Date/Time    IRON 33 06/22/2022 10:52 AM     IRON SATURATION:  No components found for: LABIRON  TIBC:    Lab Results   Component Value Date/Time    TIBC 208 06/22/2022 10:52 AM     FERRITIN:  No results found for: FERRITIN  ANA: No results found for: ANA    SPEP: No results found for: ALBCAL, ALBPCT, LABALPH, A1PCT, A2PCT, LABBETA, BETAPCT, GAMGLOB, GGPCT, PATH  UPEP: No results found for: TPU   HEPBSAG:No results found for:  HEPBSAG  HEPCAB:No results found for: HEPCAB  C3: No results found for: C3  C4: No results found for: C4  MPO ANCA: No results found for:  MPO .  PR3 ANCA:  No results found for: PR3  PTH: No results found for: PTH    Urine Creatinine:  No results found for: LABCREA  Urine Eosinophils: No results found for: UREO  Urine Protein:  No results found for: TPU  Urinalysis:  U/A:   Lab Results   Component Value Date/Time    NITRU Negative 06/22/2022 11:20 AM    COLORU Yellow 06/22/2022 11:20 AM    PHUR 6.0 06/22/2022 11:20 AM    WBCUA 0 10/21/2023 08:29 AM    RBCUA 0 10/21/2023 08:29 AM    BACTERIA None Seen 10/21/2023 08:29 AM    CLARITYU Clear 06/22/2022 11:20 AM    LEUKOCYTESUR Negative 06/22/2022 11:20 AM    UROBILINOGEN 1.0 06/22/2022 11:20 AM    BILIRUBINUR Negative 06/22/2022 11:20 AM    BLOODU Negative 06/22/2022 11:20 AM    GLUCOSEU Negative 06/22/2022 11:20 AM    KETUA TRACE 06/22/2022 11:20 AM       Radiology:  Reviewed as available.    Thank you for the new consult with [facility]). Please do not hesitate to contact us  with questions. Electronically signed by GLENWOOD Rolene Stank, MD on 09/17/2024 at 12:30 PM  Office : [phone]  Fax [phone]     "
